# Patient Record
Sex: Female | Born: 1980 | Race: Black or African American | Hispanic: No | Marital: Single | Smoking: Never smoker
Health system: Southern US, Community
[De-identification: ages and names within clinical notes are randomized; demographics above are authoritative.]

## PROBLEM LIST (undated history)

## (undated) DIAGNOSIS — E559 Vitamin D deficiency, unspecified: Secondary | ICD-10-CM

## (undated) DIAGNOSIS — D573 Sickle-cell trait: Secondary | ICD-10-CM

## (undated) DIAGNOSIS — N879 Dysplasia of cervix uteri, unspecified: Secondary | ICD-10-CM

## (undated) DIAGNOSIS — R7303 Prediabetes: Secondary | ICD-10-CM

## (undated) DIAGNOSIS — D649 Anemia, unspecified: Secondary | ICD-10-CM

## (undated) DIAGNOSIS — B009 Herpesviral infection, unspecified: Secondary | ICD-10-CM

## (undated) DIAGNOSIS — N760 Acute vaginitis: Secondary | ICD-10-CM

## (undated) DIAGNOSIS — I10 Essential (primary) hypertension: Secondary | ICD-10-CM

## (undated) DIAGNOSIS — Z9289 Personal history of other medical treatment: Secondary | ICD-10-CM

## (undated) DIAGNOSIS — B9689 Other specified bacterial agents as the cause of diseases classified elsewhere: Secondary | ICD-10-CM

## (undated) DIAGNOSIS — B977 Papillomavirus as the cause of diseases classified elsewhere: Secondary | ICD-10-CM

## (undated) HISTORY — DX: Sickle-cell trait: D57.3

## (undated) HISTORY — DX: Dysplasia of cervix uteri, unspecified: N87.9

## (undated) HISTORY — DX: Papillomavirus as the cause of diseases classified elsewhere: B97.7

## (undated) HISTORY — DX: Acute vaginitis: N76.0

## (undated) HISTORY — DX: Herpesviral infection, unspecified: B00.9

## (undated) HISTORY — DX: Other specified bacterial agents as the cause of diseases classified elsewhere: B96.89

## (undated) HISTORY — DX: Anemia, unspecified: D64.9

## (undated) HISTORY — DX: Prediabetes: R73.03

## (undated) HISTORY — PX: NO PAST SURGERIES: SHX2092

## (undated) HISTORY — DX: Personal history of other medical treatment: Z92.89

## (undated) HISTORY — DX: Vitamin D deficiency, unspecified: E55.9

---

## 2000-07-14 DIAGNOSIS — N879 Dysplasia of cervix uteri, unspecified: Secondary | ICD-10-CM

## 2000-07-14 HISTORY — DX: Dysplasia of cervix uteri, unspecified: N87.9

## 2015-01-09 DIAGNOSIS — Z9289 Personal history of other medical treatment: Secondary | ICD-10-CM

## 2015-01-09 HISTORY — DX: Personal history of other medical treatment: Z92.89

## 2015-01-09 LAB — HM PAP SMEAR

## 2016-01-12 DIAGNOSIS — E559 Vitamin D deficiency, unspecified: Secondary | ICD-10-CM

## 2016-01-12 HISTORY — DX: Vitamin D deficiency, unspecified: E55.9

## 2016-02-12 DIAGNOSIS — R7303 Prediabetes: Secondary | ICD-10-CM

## 2016-02-12 HISTORY — DX: Prediabetes: R73.03

## 2017-01-28 ENCOUNTER — Telehealth: Payer: Self-pay

## 2017-01-28 MED ORDER — NORETHINDRONE 0.35 MG PO TABS: 1.0000 | ORAL_TABLET | Freq: Every day | ORAL | 0 refills | Status: DC

## 2017-01-28 NOTE — Telephone Encounter (Signed)
Pt took her last bcp last night.  Has appt next week.  Need refill called in to [facility] to get her to appt.  Pt aware one pack eRx'd

## 2017-02-05 ENCOUNTER — Ambulatory Visit (INDEPENDENT_AMBULATORY_CARE_PROVIDER_SITE_OTHER): Payer: BLUE CROSS/BLUE SHIELD | Admitting: Advanced Practice Midwife

## 2017-02-05 ENCOUNTER — Encounter: Payer: Self-pay | Admitting: Advanced Practice Midwife

## 2017-02-05 VITALS — BP 126/88 | Ht 69.0 in | Wt 232.0 lb

## 2017-02-05 DIAGNOSIS — Z01419 Encounter for gynecological examination (general) (routine) without abnormal findings: Secondary | ICD-10-CM

## 2017-02-05 DIAGNOSIS — Z124 Encounter for screening for malignant neoplasm of cervix: Secondary | ICD-10-CM | POA: Diagnosis not present

## 2017-02-05 DIAGNOSIS — Z3041 Encounter for surveillance of contraceptive pills: Secondary | ICD-10-CM | POA: Diagnosis not present

## 2017-02-05 MED ORDER — NORETHINDRONE 0.35 MG PO TABS: 1.0000 | ORAL_TABLET | Freq: Every day | ORAL | 11 refills | Status: DC

## 2017-02-05 NOTE — Progress Notes (Signed)
Patient ID: Amber Price, female   DOB: Mar 25, 1981, 36 y.o.   MRN: [id number]     Gynecology Annual Exam  PCP: Patient, No Pcp Per  Chief Complaint:  Chief Complaint  Patient presents with  . Annual Exam    History of Present Illness: Patient is a 36 y.o. [id number] presents for annual exam. The patient has no complaints today.   LMP: Patient's last menstrual period was 01/22/2017. Average Interval: regular, 28 days Duration of flow: 5 days Heavy Menses: no Clots: no Intermenstrual Bleeding: no Postcoital Bleeding: no Dysmenorrhea: no  The patient is sexually active. She currently uses oral progesterone-only contraceptive for contraception. She denies dyspareunia.  The patient does occasionally perform self breast exams.  There is no notable family history of breast or ovarian cancer in her family.  The patient wears seatbelts: yes.   The patient has regular exercise: yes.  She admits healthy lifestyle diet, hydration and exercise.  The patient denies current symptoms of depression.    Review of Systems: Review of Systems  Constitutional: Negative.   HENT: Negative.   Eyes: Negative.   Respiratory: Negative.   Cardiovascular: Negative.   Gastrointestinal: Negative.   Genitourinary: Negative.   Musculoskeletal: Negative.   Skin: Negative.   Neurological: Negative.   Endo/Heme/Allergies: Negative.   Psychiatric/Behavioral: Negative.     Past Medical History:  Past Medical History:  Diagnosis Date  . Herpes     Past Surgical History:  History reviewed. No pertinent surgical history.  Gynecologic History:  Patient's last menstrual period was 01/22/2017. Contraception: oral progesterone-only contraceptive Last Pap: Results were: no abnormalities   Obstetric History: [id number]  Family History:  Family History  Problem Relation Age of Onset  . Diabetes Mellitus II Mother   . Hypercholesterolemia Father     Social History:  Social History   Social History   . Marital status: Single    Spouse name: N/A  . Number of children: N/A  . Years of education: N/A   Occupational History  . Not on file.   Social History Main Topics  . Smoking status: Never Smoker  . Smokeless tobacco: Never Used  . Alcohol use No  . Drug use: No  . Sexual activity: Yes    Birth control/ protection: Pill   Other Topics Concern  . Not on file   Social History Narrative  . No narrative on file    Allergies:  No Known Allergies  Medications: Prior to Admission medications   Medication Sig Start Date End Date Taking? Authorizing Provider  norethindrone (CAMILA) 0.35 MG tablet Take 1 tablet (0.35 mg total) by mouth daily. 01/28/17  Yes Rod Can, CNM  valACYclovir (VALTREX) 500 MG tablet Take 500 mg by mouth 2 (two) times daily.   Yes [provider]    Physical Exam Vitals: Blood pressure 126/88, height 5\' 9"  (1.753 m), weight 232 lb (105.2 kg), last menstrual period 01/22/2017.  General: NAD HEENT: normocephalic, anicteric Thyroid: no enlargement, no palpable nodules Pulmonary: No increased work of breathing, CTAB Cardiovascular: RRR, distal pulses 2+ Breast: Breast symmetrical, no tenderness, no palpable nodules or masses, no skin or nipple retraction present, no nipple discharge.  No axillary or supraclavicular lymphadenopathy. Abdomen: NABS, soft, non-tender, non-distended.  Umbilicus without lesions.  No hepatomegaly, splenomegaly or masses palpable. No evidence of hernia  Genitourinary:  External: Normal external female genitalia.  Normal urethral meatus, normal  Bartholin's and Skene's glands.    Vagina: Normal vaginal mucosa, no evidence of  prolapse.    Cervix: Grossly normal in appearance, no bleeding  Uterus: Non-enlarged, mobile, normal contour.  No CMT  Adnexa: ovaries non-enlarged, no adnexal masses  Rectal: deferred  Lymphatic: no evidence of inguinal lymphadenopathy Extremities: no edema, erythema, or  tenderness Neurologic: Grossly intact Psychiatric: mood appropriate, affect full   Assessment: 36 y.o. [id number] Well woman exam with PAP smear  Plan: Problem List Items Addressed This Visit    None    Visit Diagnoses    Well woman exam with routine gynecological exam    -  Primary   Relevant Orders   IGP, Aptima HPV   Cervical cancer screening       Relevant Orders   IGP, Aptima HPV      1) STI screening was offered and declined  2) ASCCP guidelines and rational discussed.  Patient opts for every 2 years screening interval  3) Contraception - Patient chooses to continue on current progesterone-only pill  4) Routine healthcare maintenance including cholesterol, diabetes screening discussed managed by PCP  5) Follow up 1 year for routine annual exam  Rod Can, CNM

## 2017-02-07 LAB — IGP, APTIMA HPV
HPV APTIMA: NEGATIVE
PAP SMEAR COMMENT: 0

## 2017-02-12 ENCOUNTER — Encounter: Payer: Self-pay | Admitting: Advanced Practice Midwife

## 2017-02-12 ENCOUNTER — Other Ambulatory Visit: Payer: Self-pay

## 2017-02-12 MED ORDER — VALACYCLOVIR HCL 500 MG PO TABS: 500.0000 mg | ORAL_TABLET | Freq: Two times a day (BID) | ORAL | 0 refills | Status: DC

## 2017-04-19 ENCOUNTER — Other Ambulatory Visit: Payer: Self-pay | Admitting: Advanced Practice Midwife

## 2017-04-20 ENCOUNTER — Other Ambulatory Visit: Payer: Self-pay | Admitting: Advanced Practice Midwife

## 2017-07-01 ENCOUNTER — Encounter: Payer: BLUE CROSS/BLUE SHIELD | Admitting: Obstetrics and Gynecology

## 2017-07-03 ENCOUNTER — Ambulatory Visit (INDEPENDENT_AMBULATORY_CARE_PROVIDER_SITE_OTHER): Payer: BLUE CROSS/BLUE SHIELD | Admitting: Maternal Newborn

## 2017-07-03 ENCOUNTER — Encounter: Payer: Self-pay | Admitting: Maternal Newborn

## 2017-07-03 VITALS — BP 136/90 | Wt 240.0 lb

## 2017-07-03 DIAGNOSIS — Z8619 Personal history of other infectious and parasitic diseases: Secondary | ICD-10-CM | POA: Insufficient documentation

## 2017-07-03 DIAGNOSIS — E669 Obesity, unspecified: Secondary | ICD-10-CM | POA: Insufficient documentation

## 2017-07-03 DIAGNOSIS — Z6835 Body mass index (BMI) 35.0-35.9, adult: Secondary | ICD-10-CM | POA: Insufficient documentation

## 2017-07-03 DIAGNOSIS — Z3480 Encounter for supervision of other normal pregnancy, unspecified trimester: Secondary | ICD-10-CM | POA: Insufficient documentation

## 2017-07-03 NOTE — Progress Notes (Signed)
No concerns.rj 

## 2017-07-03 NOTE — Progress Notes (Signed)
07/03/2017   Chief Complaint: Amenorrhea, positive home pregnancy test, desires prenatal care.  Transfer of Care Patient: no  History of Present Illness: Amber Price is a 36 y.o. G2P2002 at 10w 0d based on Patient's last menstrual period on 04/24/2017 (approximate), with an Estimated Date of Delivery: 01/29/2018, with the above CC.   Her periods were: regular periods every 28 days She was using no method when she conceived.  She has Positive signs or symptoms of nausea/vomiting of pregnancy. She has Negative signs or symptoms of miscarriage or preterm labor She identifies Negative Zika risk factors for her and her partner On any different medications around the time she conceived/early pregnancy: No  History of varicella: Yes   ROS: A 12-point review of systems was performed and negative, except as stated in the above HPI.  OBGYN History: As per HPI. OB History  Gravida Para Term Preterm AB Living  3 2 2     2   SAB TAB Ectopic Multiple Live Births          2    # Outcome Date GA Lbr Len/2nd Weight Sex Delivery Anes PTL Lv  3 Current           2 Term      Vag-Spont     1 Term      Vag-Spont         Any issues with any prior pregnancies: no Any prior children are healthy, doing well, without any problems or issues: yes History of pap smears: Yes. Last pap smear 02/05/2017. NILM and HPV Negative History of STIs: Yes, herpes   Past Medical History: Past Medical History:  Diagnosis Date  . Bacterial vaginosis   . Cervical dysplasia 2002   [place]  . Herpes   . History of Papanicolaou smear of cervix 01/09/2015   -/-  . 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 Pre-diabetes 02/2016   DIET, EXERCISE, WT LOSS. RECK 2018 ANNUAL  . Sickle cell trait ([place])   . Vitamin D deficiency 01/2016   ADD VIT D3 5000 IU DAILY    Past Surgical History: Past Surgical History:  Procedure Laterality Date  . NO PAST SURGERIES      Family History:  Family History  Problem Relation Age of  Onset  . Diabetes Mellitus II Mother   . Hypertension Mother   . Hypercholesterolemia Father   . Cancer Paternal Grandmother 2       LUNG   She denies any female cancers, bleeding or blood clotting disorders.  She denies any history of intellectual disability, birth defects or genetic disorders in her or the FOB's history  Social History:  Social History   Socioeconomic History  . Marital status: Single    Spouse name: Not on file  . Number of children: 2  . Years of education: 34  . Highest education level: Not on file  Social Needs  . Financial resource strain: Not on file  . Food insecurity - worry: Not on file  . Food insecurity - inability: Not on file  . Transportation needs - medical: Not on file  . Transportation needs - non-medical: Not on file  Occupational History  . Occupation: BUSINESS OFFICE SPECIALIST  Tobacco Use  . Smoking status: Never Smoker  . Smokeless tobacco: Never Used  Substance and Sexual Activity  . Alcohol use: Yes  . Drug use: No  . Sexual activity: Yes    Birth control/protection: Pill  Other Topics  Concern  . Not on file  Social History Narrative  . Not on file   Any cats in the household: no  Allergy: No Known Allergies  Current Outpatient Medications:  Current Outpatient Medications:  .  Cholecalciferol (VITAMIN D3) 50000 units CAPS, Take 1 capsule by mouth daily., Disp: , Rfl: 2 .  valACYclovir (VALTREX) 500 MG tablet, TAKE 1 TABLET BY MOUTH TWICE A DAY, Disp: 30 tablet, Rfl: 0   Physical Exam:   BP 136/90   Wt 240 lb (108.9 kg)   LMP 04/24/2017 (Approximate)   BMI 35.44 kg/m  Body mass index is 35.44 kg/m. Constitutional: Well nourished, well developed female in no acute distress.  Neck:  Supple, normal appearance, and no thyromegaly  Cardiovascular: regular rate and rhythm Respiratory:  Clear to auscultation bilateral. Normal respiratory effort Abdomen: positive bowel sounds and no masses, hernias; diffusely non  tender to palpation, non distended Breasts: breasts appear normal, no suspicious masses, no skin or nipple changes or axillary nodes, unchanged from previous exams. Neuro/Psych:  Normal mood and affect.  Skin:  Warm and dry.  Lymphatic:  No inguinal lymphadenopathy.   Pelvic exam: is not limited by body habitus External genitalia, Bartholin's glands, Urethra, Skene's glands: within normal limits Vagina: within normal limits and with no blood in the vault  Cervix: normal appearing cervix without discharge or lesions, closed/long/high Uterus:  enlarged, c/w 10 week size Adnexa:  positive for: tenderness on the left side, otherwise normal exam  Assessment: Amber Price is a 36 y.o. P1W2585 Unknown based on Patient's last menstrual period was 04/24/2017 (approximate). with an Estimated Date of Delivery: None noted., presenting for prenatal care.  Plan:  1) Avoid alcoholic beverages. 2) Patient encouraged not to smoke.  3) Discontinue the use of all non-medicinal drugs and chemicals.  4) Take prenatal vitamins daily.  5) Seatbelt use advised 6) Nutrition, food safety (fish, cheese advisories, and high nitrite foods) and exercise discussed. 7) Hospital and practice style delivering at [facility] discussed  8) Patient is asked about travel to areas at risk for the [place] virus, and counseled to avoid travel and exposure to mosquitoes or sexual partners who may have themselves been exposed to the virus. Testing is discussed, and will be ordered as appropriate.  9) Childbirth classes at [facility] advised 10) Genetic Screening, such as with 1st Trimester Screening, cell free fetal DNA, AFP testing, and Ultrasound, as well as with amniocentesis and [facility] as appropriate, is discussed with patient. She plans to have genetic testing this pregnancy.  Problem list reviewed and updated.  Avel Sensor, CNM [facility], [facility] 07/03/2017  4:57 PM

## 2017-07-03 NOTE — Patient Instructions (Signed)
First Trimester of Pregnancy The first trimester of pregnancy is from week 1 until the end of week 13 (months 1 through 3). A week after a sperm fertilizes an egg, the egg will implant on the wall of the uterus. This embryo will begin to develop into a baby. Genes from you and your partner will form the baby. The female genes will determine whether the baby will be a boy or a girl. At 6-8 weeks, the eyes and face will be formed, and the heartbeat can be seen on ultrasound. At the end of 12 weeks, all the baby's organs will be formed. Now that you are pregnant, you will want to do everything you can to have a healthy baby. Two of the most important things are to get good prenatal care and to follow your health care provider's instructions. Prenatal care is all the medical care you receive before the baby's birth. This care will help prevent, find, and treat any problems during the pregnancy and childbirth. Body changes during your first trimester Your body goes through many changes during pregnancy. The changes vary from woman to woman.  You may gain or lose a couple of pounds at first.  You may feel sick to your stomach (nauseous) and you may throw up (vomit). If the vomiting is uncontrollable, call your health care provider.  You may tire easily.  You may develop headaches that can be relieved by medicines. All medicines should be approved by your health care provider.  You may urinate more often. Painful urination may mean you have a bladder infection.  You may develop heartburn as a result of your pregnancy.  You may develop constipation because certain hormones are causing the muscles that push stool through your intestines to slow down.  You may develop hemorrhoids or swollen veins (varicose veins).  Your breasts may begin to grow larger and become tender. Your nipples may stick out more, and the tissue that surrounds them (areola) may become darker.  Your gums may bleed and may be  sensitive to brushing and flossing.  Dark spots or blotches (chloasma, mask of pregnancy) may develop on your face. This will likely fade after the baby is born.  Your menstrual periods will stop.  You may have a loss of appetite.  You may develop cravings for certain kinds of food.  You may have changes in your emotions from day to day, such as being excited to be pregnant or being concerned that something may go wrong with the pregnancy and baby.  You may have more vivid and strange dreams.  You may have changes in your hair. These can include thickening of your hair, rapid growth, and changes in texture. Some women also have hair loss during or after pregnancy, or hair that feels dry or thin. Your hair will most likely return to normal after your baby is born.  What to expect at prenatal visits During a routine prenatal visit:  You will be weighed to make sure you and the baby are growing normally.  Your blood pressure will be taken.  Your abdomen will be measured to track your baby's growth.  The fetal heartbeat will be listened to between weeks 10 and 14 of your pregnancy.  Test results from any previous visits will be discussed.  Your health care provider may ask you:  How you are feeling.  If you are feeling the baby move.  If you have had any abnormal symptoms, such as leaking fluid, bleeding, severe headaches,   or abdominal cramping.  If you are using any tobacco products, including cigarettes, chewing tobacco, and electronic cigarettes.  If you have any questions.  Other tests that may be performed during your first trimester include:  Blood tests to find your blood type and to check for the presence of any previous infections. The tests will also be used to check for low iron levels (anemia) and protein on red blood cells (Rh antibodies). Depending on your risk factors, or if you previously had diabetes during pregnancy, you may have tests to check for high blood  sugar that affects pregnant women (gestational diabetes).  Urine tests to check for infections, diabetes, or protein in the urine.  An ultrasound to confirm the proper growth and development of the baby.  Fetal screens for spinal cord problems (spina bifida) and Down syndrome.  HIV (human immunodeficiency virus) testing. Routine prenatal testing includes screening for HIV, unless you choose not to have this test.  You may need other tests to make sure you and the baby are doing well.  Follow these instructions at home: Medicines  Follow your health care provider's instructions regarding medicine use. Specific medicines may be either safe or unsafe to take during pregnancy.  Take a prenatal vitamin that contains at least 600 micrograms (mcg) of folic acid.  If you develop constipation, try taking a stool softener if your health care provider approves. Eating and drinking  Eat a balanced diet that includes fresh fruits and vegetables, whole grains, good sources of protein such as meat, eggs, or tofu, and low-fat dairy. Your health care provider will help you determine the amount of weight gain that is right for you.  Avoid raw meat and uncooked cheese. These carry germs that can cause birth defects in the baby.  Eating four or five small meals rather than three large meals a day may help relieve nausea and vomiting. If you start to feel nauseous, eating a few soda crackers can be helpful. Drinking liquids between meals, instead of during meals, also seems to help ease nausea and vomiting.  Limit foods that are high in fat and processed sugars, such as fried and sweet foods.  To prevent constipation: ? Eat foods that are high in fiber, such as fresh fruits and vegetables, whole grains, and beans. ? Drink enough fluid to keep your urine clear or pale yellow. Activity  Exercise only as directed by your health care provider. Most women can continue their usual exercise routine during  pregnancy. Try to exercise for 30 minutes at least 5 days a week. Exercising will help you: ? Control your weight. ? Stay in shape. ? Be prepared for labor and delivery.  Experiencing pain or cramping in the lower abdomen or lower back is a good sign that you should stop exercising. Check with your health care provider before continuing with normal exercises.  Try to avoid standing for long periods of time. Move your legs often if you must stand in one place for a long time.  Avoid heavy lifting.  Wear low-heeled shoes and practice good posture.  You may continue to have sex unless your health care provider tells you not to. Relieving pain and discomfort  Wear a good support bra to relieve breast tenderness.  Take warm sitz baths to soothe any pain or discomfort caused by hemorrhoids. Use hemorrhoid cream if your health care provider approves.  Rest with your legs elevated if you have leg cramps or low back pain.  If you develop   varicose veins in your legs, wear support hose. Elevate your feet for 15 minutes, 3-4 times a day. Limit salt in your diet. Prenatal care  Schedule your prenatal visits by the twelfth week of pregnancy. They are usually scheduled monthly at first, then more often in the last 2 months before delivery.  Write down your questions. Take them to your prenatal visits.  Keep all your prenatal visits as told by your health care provider. This is important. Safety  Wear your seat belt at all times when driving.  Make a list of emergency phone numbers, including numbers for family, friends, the hospital, and police and fire departments. General instructions  Ask your health care provider for a referral to a local prenatal education class. Begin classes no later than the beginning of month 6 of your pregnancy.  Ask for help if you have counseling or nutritional needs during pregnancy. Your health care provider can offer advice or refer you to specialists for help  with various needs.  Do not use hot tubs, steam rooms, or saunas.  Do not douche or use tampons or scented sanitary pads.  Do not cross your legs for long periods of time.  Avoid cat litter boxes and soil used by cats. These carry germs that can cause birth defects in the baby and possibly loss of the fetus by miscarriage or stillbirth.  Avoid all smoking, herbs, alcohol, and medicines not prescribed by your health care provider. Chemicals in these products affect the formation and growth of the baby.  Do not use any products that contain nicotine or tobacco, such as cigarettes and e-cigarettes. If you need help quitting, ask your health care provider. You may receive counseling support and other resources to help you quit.  Schedule a dentist appointment. At home, brush your teeth with a soft toothbrush and be gentle when you floss. Contact a health care provider if:  You have dizziness.  You have mild pelvic cramps, pelvic pressure, or nagging pain in the abdominal area.  You have persistent nausea, vomiting, or diarrhea.  You have a bad smelling vaginal discharge.  You have pain when you urinate.  You notice increased swelling in your face, hands, legs, or ankles.  You are exposed to fifth disease or chickenpox.  You are exposed to German measles (rubella) and have never had it. Get help right away if:  You have a fever.  You are leaking fluid from your vagina.  You have spotting or bleeding from your vagina.  You have severe abdominal cramping or pain.  You have rapid weight gain or loss.  You vomit blood or material that looks like coffee grounds.  You develop a severe headache.  You have shortness of breath.  You have any kind of trauma, such as from a fall or a car accident. Summary  The first trimester of pregnancy is from week 1 until the end of week 13 (months 1 through 3).  Your body goes through many changes during pregnancy. The changes vary from  woman to woman.  You will have routine prenatal visits. During those visits, your health care provider will examine you, discuss any test results you may have, and talk with you about how you are feeling. This information is not intended to replace advice given to you by your health care provider. Make sure you discuss any questions you have with your health care provider. Document Released: 06/24/2001 Document Revised: 06/11/2016 Document Reviewed: 06/11/2016 Elsevier Interactive Patient Education  2018 Elsevier   Inc.  

## 2017-07-04 LAB — RPR+RH+ABO+RUB AB+AB SCR+CB...
ANTIBODY SCREEN: NEGATIVE
HEMOGLOBIN: 11 g/dL — AB (ref 11.1–15.9)
HIV SCREEN 4TH GENERATION: NONREACTIVE
Hematocrit: 34.2 % (ref 34.0–46.6)
Hepatitis B Surface Ag: NEGATIVE
MCH: 25.8 pg — AB (ref 26.6–33.0)
MCHC: 32.2 g/dL (ref 31.5–35.7)
MCV: 80 fL (ref 79–97)
PLATELETS: 280 10*3/uL (ref 150–379)
RBC: 4.27 x10E6/uL (ref 3.77–5.28)
RDW: 15 % (ref 12.3–15.4)
RPR: NONREACTIVE
Rh Factor: POSITIVE
VARICELLA: 761 {index} (ref >165)
WBC: 8.6 10*3/uL (ref 3.4–10.8)

## 2017-07-06 LAB — URINE DRUG PANEL 7
Amphetamines, Urine: NEGATIVE ng/mL
Barbiturate Quant, Ur: NEGATIVE ng/mL
Benzodiazepine Quant, Ur: NEGATIVE ng/mL
CANNABINOID QUANT UR: NEGATIVE ng/mL
Cocaine (Metab.): NEGATIVE ng/mL
Opiate Quant, Ur: NEGATIVE ng/mL
PCP QUANT UR: NEGATIVE ng/mL

## 2017-07-06 LAB — URINE CULTURE

## 2017-07-06 LAB — GC/CHLAMYDIA PROBE AMP
CHLAMYDIA, DNA PROBE: NEGATIVE
NEISSERIA GONORRHOEAE BY PCR: NEGATIVE

## 2017-07-08 ENCOUNTER — Other Ambulatory Visit: Payer: Self-pay | Admitting: Maternal Newborn

## 2017-07-08 DIAGNOSIS — Z0189 Encounter for other specified special examinations: Secondary | ICD-10-CM

## 2017-07-09 ENCOUNTER — Encounter: Payer: Self-pay | Admitting: Obstetrics and Gynecology

## 2017-07-09 ENCOUNTER — Ambulatory Visit (INDEPENDENT_AMBULATORY_CARE_PROVIDER_SITE_OTHER): Payer: BLUE CROSS/BLUE SHIELD

## 2017-07-09 ENCOUNTER — Ambulatory Visit (INDEPENDENT_AMBULATORY_CARE_PROVIDER_SITE_OTHER): Payer: BLUE CROSS/BLUE SHIELD | Admitting: Obstetrics and Gynecology

## 2017-07-09 ENCOUNTER — Other Ambulatory Visit: Payer: Self-pay | Admitting: Maternal Newborn

## 2017-07-09 VITALS — BP 146/96 | Wt 240.0 lb

## 2017-07-09 DIAGNOSIS — Z0189 Encounter for other specified special examinations: Secondary | ICD-10-CM

## 2017-07-09 DIAGNOSIS — Z362 Encounter for other antenatal screening follow-up: Secondary | ICD-10-CM | POA: Diagnosis not present

## 2017-07-09 DIAGNOSIS — O021 Missed abortion: Secondary | ICD-10-CM

## 2017-07-09 MED ORDER — HYDROCODONE-ACETAMINOPHEN 5-325 MG PO TABS: 1.0000 | ORAL_TABLET | ORAL | 0 refills | Status: DC | PRN

## 2017-07-09 MED ORDER — MISOPROSTOL 200 MCG PO TABS: ORAL_TABLET | ORAL | 0 refills | Status: DC

## 2017-07-09 NOTE — Progress Notes (Signed)
  Routine Prenatal Care Visit  Subjective  Amber Price is a 36 y.o. G3P2002 at [redacted]w[redacted]d being seen today for ongoing prenatal care.  Missed abortion of twin gestation found today on ultrasound. Patient tearful with significant other in room.   Objective  Blood pressure (!) 146/96, weight 240 lb (108.9 kg), last menstrual period 04/24/2017. Pregravid weight 243 lb (110.2 kg) Total Weight Gain  (-1.361 kg) Urinalysis:      Fetal Status:           General:  Alert, oriented and cooperative. Patient is in no acute distress.  Skin: Skin is warm and dry. No rash noted.   Cardiovascular: Normal heart rate noted  Respiratory: Normal respiratory effort, no problems with respiration noted  Abdomen: Soft, gravid, appropriate for gestational age.       Pelvic:  Cervical exam deferred        Extremities: Normal range of motion.     Mental Status: Normal mood and affect. Normal behavior. Normal judgment and thought content.   Assessment   36 y.o. G3P2002 at [redacted]w[redacted]d by  01/29/2018, by Last Menstrual Period presenting for routine prenatal visit  Plan   THIRD Problems (from 07/03/17 to present)    Problem Noted Resolved   Supervision of other normal pregnancy, antepartum 07/03/2017 by Rexene Agent, CNM No   Overview Signed 07/03/2017  5:03 PM by Rexene Agent, [place] Prenatal Labs  Dating  Blood type:     Genetic Screen 1 Screen:    AFP:     Quad:     NIPS: Antibody:   Anatomic [place]  Rubella:   Varicella:    GTT Early:               Third trimester:  RPR:     Rhogam  HBsAg:     TDaP vaccine                       Flu Shot: HIV:     Baby Food                                GBS:   Contraception  Pap: 02/05/2017, NILM and HPV Neg  CBB     CS/VBAC    Support Person              History of herpes genitalis 07/03/2017 by Rexene Agent, CNM No   BMI 35.0-35.9,adult 07/03/2017 by Rexene Agent, CNM No       Reviewed option of expectant, medical and surgical  management. Patient opted for medical management of miscarriage. Given information from [employer], bleeding precautions, pain medications and a work note. Will have patient follow up in 1 week for transvaginal US and follow up visit.   Homero Fellers, MD  07/09/2017 5:47 PM

## 2017-07-09 NOTE — Patient Instructions (Signed)

## 2017-07-15 ENCOUNTER — Ambulatory Visit: Payer: BLUE CROSS/BLUE SHIELD | Admitting: Obstetrics and Gynecology

## 2017-07-15 ENCOUNTER — Other Ambulatory Visit: Payer: BLUE CROSS/BLUE SHIELD

## 2017-07-15 ENCOUNTER — Other Ambulatory Visit: Payer: Self-pay | Admitting: Obstetrics and Gynecology

## 2017-07-15 ENCOUNTER — Telehealth: Payer: Self-pay | Admitting: Obstetrics and Gynecology

## 2017-07-15 NOTE — Telephone Encounter (Signed)
Note is up front for pt if needed

## 2017-07-21 ENCOUNTER — Ambulatory Visit (INDEPENDENT_AMBULATORY_CARE_PROVIDER_SITE_OTHER): Payer: BLUE CROSS/BLUE SHIELD | Admitting: Obstetrics and Gynecology

## 2017-07-21 ENCOUNTER — Ambulatory Visit (INDEPENDENT_AMBULATORY_CARE_PROVIDER_SITE_OTHER): Payer: BLUE CROSS/BLUE SHIELD

## 2017-07-21 ENCOUNTER — Encounter: Payer: Self-pay | Admitting: Obstetrics and Gynecology

## 2017-07-21 VITALS — BP 122/70 | HR 84 | Ht 69.0 in | Wt 238.0 lb

## 2017-07-21 DIAGNOSIS — O039 Complete or unspecified spontaneous abortion without complication: Secondary | ICD-10-CM

## 2017-07-21 DIAGNOSIS — O021 Missed abortion: Secondary | ICD-10-CM

## 2017-07-21 NOTE — Progress Notes (Signed)
Patient ID: Amber Price, female   DOB: 1981-06-07, 37 y.o.   MRN: [id number]  Reason for Consult: U/S follow up (First trimester miscarriage) and Medication Refill (Valtrex)   Referred by Homero Fellers, *  Subjective:     HPI:  Amber Price is a 37 y.o. female she presents today for follow up from a missed abortion. She was diagnosed last week with a missed abortion of a twin gestation. She was counseled on her options and elected for medical management. She took cytotec on Monday 07/13/17 and passed tissue her bleeding has gradually subsided and today she is only having small spotting. She is feeling well without abdominal pain or weakness. [place] today in office did not show retained products. Small 1cm fibroid. Reviewed images with patient.    Past Medical History:  Diagnosis Date  . Bacterial vaginosis   . Cervical dysplasia 2002   [place]  . Herpes   . History of Papanicolaou smear of cervix 01/09/2015   -/-  . 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 Pre-diabetes 02/2016   DIET, EXERCISE, WT LOSS. RECK 2018 ANNUAL  . Sickle cell trait ([place])   . Vitamin D deficiency 01/2016   ADD VIT D3 5000 IU DAILY   Family History  Problem Relation Age of Onset  . Diabetes Mellitus II Mother   . Hypertension Mother   . Hypercholesterolemia Father   . Cancer Paternal Grandmother 67       LUNG   Past Surgical History:  Procedure Laterality Date  . NO PAST SURGERIES      Short Social History:  Social History   Tobacco Use  . Smoking status: Never Smoker  . Smokeless tobacco: Never Used  Substance Use Topics  . Alcohol use: Yes    No Known Allergies  Current Outpatient Medications  Medication Sig Dispense Refill  . valACYclovir (VALTREX) 500 MG tablet TAKE 1 TABLET BY MOUTH TWICE A DAY 30 tablet 0   No current facility-administered medications for this visit.     Review of Systems  Constitutional: Negative for chills, fatigue, fever and unexpected weight change.    HENT: Negative for trouble swallowing.  Eyes: Negative for loss of vision.  Respiratory: Negative for cough, shortness of breath and wheezing.  Cardiovascular: Negative for chest pain, leg swelling, palpitations and syncope.  GI: Negative for abdominal pain, blood in stool, diarrhea, nausea and vomiting.  GU: Negative for difficulty urinating, dysuria, frequency and hematuria.  Musculoskeletal: Negative for back pain, leg pain and joint pain.  Skin: Negative for rash.  Neurological: Negative for dizziness, headaches, light-headedness, numbness and seizures.  Psychiatric: Negative for behavioral problem, confusion, depressed mood and sleep disturbance.        Objective:  Objective   Vitals:   07/21/17 1126  BP: 122/70  Pulse: 84  Weight: 238 lb (108 kg)  Height: 5\' 9"  (1.753 m)   Body mass index is 35.15 kg/m.  Physical Exam  Constitutional: She is oriented to person, place, and time. She appears well-developed and well-nourished.  HENT:  Head: Normocephalic and atraumatic.  Cardiovascular: Normal rate and regular rhythm.  Pulmonary/Chest: Effort normal and breath sounds normal.  Abdominal: Soft.  Neurological: She is alert and oriented to person, place, and time.  Skin: Skin is warm and dry.  Psychiatric: She has a normal mood and affect. Her behavior is normal. Judgment and thought content normal.   Transvaginal US images reviewed. 32mm endometrium, no retained  products of conception.      Assessment/Plan:   42AS [id number] s/p a missed abortion, now complete after medical management. Follow up as needed.   More than 10 minutes spent in the room face to face with patient.  Homero Fellers MD [facility] 07/21/17 1:33 PM

## 2017-09-04 ENCOUNTER — Other Ambulatory Visit: Payer: Self-pay | Admitting: Advanced Practice Midwife

## 2017-11-20 ENCOUNTER — Encounter: Payer: Self-pay | Admitting: Obstetrics and Gynecology

## 2017-11-20 ENCOUNTER — Ambulatory Visit (INDEPENDENT_AMBULATORY_CARE_PROVIDER_SITE_OTHER): Payer: BLUE CROSS/BLUE SHIELD | Admitting: Obstetrics and Gynecology

## 2017-11-20 ENCOUNTER — Encounter: Payer: BLUE CROSS/BLUE SHIELD | Admitting: Obstetrics and Gynecology

## 2017-11-20 VITALS — BP 130/80 | Wt 240.0 lb

## 2017-11-20 DIAGNOSIS — O09521 Supervision of elderly multigravida, first trimester: Secondary | ICD-10-CM

## 2017-11-20 DIAGNOSIS — Z3201 Encounter for pregnancy test, result positive: Secondary | ICD-10-CM | POA: Diagnosis not present

## 2017-11-20 DIAGNOSIS — O0991 Supervision of high risk pregnancy, unspecified, first trimester: Secondary | ICD-10-CM

## 2017-11-20 DIAGNOSIS — Z8619 Personal history of other infectious and parasitic diseases: Secondary | ICD-10-CM

## 2017-11-20 LAB — POCT URINE PREGNANCY: PREG TEST UR: POSITIVE — AB

## 2017-11-20 NOTE — Progress Notes (Signed)
12/13/2017   Chief Complaint: Missed period  Transfer of Care Patient: no  History of Present Illness: Amber Price is a 37 y.o. [id number] [redacted]w[redacted]d based on Patient's last menstrual period was 09/13/2017 (exact date). with an Estimated Date of Delivery: 06/20/18, with the above CC.   Her periods were: regular periods every 30 days She was using no method when she conceived.  She has Positive signs or symptoms of nausea/vomiting of pregnancy. She has negative signs or symptoms of miscarriage or preterm labor She identifies Negative Zika risk factors for her and her partner On any different medications around the time she conceived/early pregnancy: No  History of varicella: Yes   ROS: A 12-point review of systems was performed and negative, except as stated in the above HPI.  OBGYN History: As per HPI. OB History  Gravida Para Term Preterm AB Living  4 2 2     2   SAB TAB Ectopic Multiple Live Births          2    # Outcome Date GA Lbr Len/2nd Weight Sex Delivery Anes PTL Lv  4 Current           3 Gravida           2 Term      Vag-Spont     1 Term      Vag-Spont       Any issues with any prior pregnancies: no Any prior children are healthy, doing well, without any problems or issues: yes History of pap smears: Yes. Last pap smear 02/05/2017. Abnormal: no NIL History of STIs: Yes, herpes   Past Medical History: Past Medical History:  Diagnosis Date  . Bacterial vaginosis   . Cervical dysplasia 2002   [place]  . Herpes   . History of Papanicolaou smear of cervix 01/09/2015   -/-  . 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 Pre-diabetes 02/2016   DIET, EXERCISE, WT LOSS. RECK 2018 ANNUAL  . Sickle cell trait ([place])   . Vitamin D deficiency 01/2016   ADD VIT D3 5000 IU DAILY    Past Surgical History: Past Surgical History:  Procedure Laterality Date  . NO PAST SURGERIES      Family History:  Family History  Problem Relation Age of Onset  . Diabetes Mellitus II Mother   .  Hypertension Mother   . Hypercholesterolemia Father   . Cancer Paternal Grandmother 51       LUNG   She denies any female cancers, bleeding or blood clotting disorders.  She denies any history of mental retardation, birth defects or genetic disorders in her or the FOB's history  Social History:  Social History   Socioeconomic History  . Marital status: Single    Spouse name: Not on file  . Number of children: 2  . Years of education: 21  . Highest education level: Not on file  Occupational History  . Occupation: BUSINESS OFFICE SPECIALIST  Social Needs  . Financial resource strain: Not on file  . Food insecurity:    Worry: Not on file    Inability: Not on file  . Transportation needs:    Medical: Not on file    Non-medical: Not on file  Tobacco Use  . Smoking status: Never Smoker  . Smokeless tobacco: Never Used  Substance and Sexual Activity  . Alcohol use: Yes  . Drug use: No  . Sexual activity: Yes    Birth control/protection: Pill  Lifestyle  . Physical activity:    Days per week: Not on file    Minutes per session: Not on file  . Stress: Not on file  Relationships  . Social connections:    Talks on phone: Not on file    Gets together: Not on file    Attends religious service: Not on file    Active member of club or organization: Not on file    Attends meetings of clubs or organizations: Not on file    Relationship status: Not on file  . Intimate partner violence:    Fear of current or ex partner: Not on file    Emotionally abused: Not on file    Physically abused: Not on file    Forced sexual activity: Not on file  Other Topics Concern  . Not on file  Social History Narrative  . Not on file    Allergy: No Known Allergies  Current Outpatient Medications:  Current Outpatient Medications:  .  valACYclovir (VALTREX) 500 MG tablet, Take 1 tablet (500 mg total) by mouth 2 (two) times daily., Disp: 30 tablet, Rfl: 0   Physical Exam:   BP 130/80    Wt 240 lb (108.9 kg)   LMP 09/13/2017 (Exact Date)   BMI 35.44 kg/m  Body mass index is 35.44 kg/m. Constitutional: Well nourished, well developed female in no acute distress.  Neck:  Supple, normal appearance, and no thyromegaly  Cardiovascular: S1, S2 normal, no murmur, rub or gallop, regular rate and rhythm Respiratory:  Clear to auscultation bilateral. Normal respiratory effort Abdomen: positive bowel sounds and no masses, hernias; diffusely non tender to palpation, non distended Breasts: breasts appear normal, no suspicious masses, no skin or nipple changes or axillary nodes. Neuro/Psych:  Normal mood and affect.  Skin:  Warm and dry.  Lymphatic:  No inguinal lymphadenopathy.   Pelvic exam: is not limited by body habitus EGBUS: within normal limits, Vagina: within normal limits and with no blood in the vault, Cervix: normal appearing cervix without discharge or lesions, closed/long/high, Uterus:  nonenlarged, and Adnexa:  no mass, fullness, tenderness  Bedsisde ultrasound shows an empty gestational sac. No yolk sac, no fetal pole seen.   Assessment: Amber Price is a 37 y.o. [id number] [redacted]w[redacted]d based on Patient's last menstrual period was 09/13/2017 (exact date). with an Estimated Date of Delivery: 06/20/18,  for prenatal care.  Plan:  1) Avoid alcoholic beverages. 2) Patient encouraged not to smoke.  3) Discontinue the use of all non-medicinal drugs and chemicals.  4) Take prenatal vitamins daily.  5) Seatbelt use advised 6) Nutrition, food safety (fish, cheese advisories, and high nitrite foods) and exercise discussed. 7) Hospital and practice style delivering at [facility] discussed  8) Patient is asked about travel to areas at risk for the [place] virus, and counseled to avoid travel and exposure to mosquitoes or sexual partners who may have themselves been exposed to the virus. Testing is discussed, and will be ordered as appropriate.  9) Childbirth classes at [facility] advised 10) Genetic  Screening, such as with 1st Trimester Screening, cell free fetal DNA, AFP testing, and Ultrasound, as well as with amniocentesis and CVS as appropriate, is discussed with patient. She plans to have  genetic testing this pregnancy. 11)Only small gestational sac seen on bedside US. Will obtain Beta hcg levels next week and plan for repeat ultrasound.   Adrian Prows MD [facility], [facility] 12/13/17 5:07 PM

## 2017-11-24 ENCOUNTER — Other Ambulatory Visit: Payer: BLUE CROSS/BLUE SHIELD

## 2017-11-24 DIAGNOSIS — Z3201 Encounter for pregnancy test, result positive: Secondary | ICD-10-CM

## 2017-11-25 LAB — BETA HCG QUANT (REF LAB): hCG Quant: 16852 m[IU]/mL

## 2017-11-25 NOTE — Progress Notes (Signed)
Released to mychart, plan for repeat in 48 hrs.

## 2017-11-26 ENCOUNTER — Other Ambulatory Visit: Payer: Self-pay | Admitting: Obstetrics and Gynecology

## 2017-11-26 ENCOUNTER — Other Ambulatory Visit: Payer: BLUE CROSS/BLUE SHIELD

## 2017-11-26 DIAGNOSIS — Z3201 Encounter for pregnancy test, result positive: Secondary | ICD-10-CM

## 2017-11-27 LAB — BETA HCG QUANT (REF LAB): hCG Quant: 15615 m[IU]/mL

## 2017-11-30 ENCOUNTER — Other Ambulatory Visit: Payer: Self-pay | Admitting: Advanced Practice Midwife

## 2017-11-30 DIAGNOSIS — Z3401 Encounter for supervision of normal first pregnancy, first trimester: Secondary | ICD-10-CM

## 2017-12-01 ENCOUNTER — Ambulatory Visit (INDEPENDENT_AMBULATORY_CARE_PROVIDER_SITE_OTHER): Payer: BLUE CROSS/BLUE SHIELD

## 2017-12-01 ENCOUNTER — Encounter: Payer: Self-pay | Admitting: Advanced Practice Midwife

## 2017-12-01 ENCOUNTER — Ambulatory Visit (INDEPENDENT_AMBULATORY_CARE_PROVIDER_SITE_OTHER): Payer: BLUE CROSS/BLUE SHIELD | Admitting: Advanced Practice Midwife

## 2017-12-01 ENCOUNTER — Other Ambulatory Visit: Payer: Self-pay | Admitting: Advanced Practice Midwife

## 2017-12-01 VITALS — BP 122/72 | Wt 237.0 lb

## 2017-12-01 DIAGNOSIS — Z3401 Encounter for supervision of normal first pregnancy, first trimester: Secondary | ICD-10-CM

## 2017-12-01 DIAGNOSIS — Z3A11 11 weeks gestation of pregnancy: Secondary | ICD-10-CM

## 2017-12-01 DIAGNOSIS — Z3490 Encounter for supervision of normal pregnancy, unspecified, unspecified trimester: Secondary | ICD-10-CM

## 2017-12-01 NOTE — Progress Notes (Signed)
Viability scan today is small gestational sac too small to calculate. Patient reports that at the beginning of the pregnancy she felt nausea, breast tenderness, fatigue. She now only has fatigue. Will do beta hcg level today and then repeat u/s in 2 weeks. Given the decrease in hormones at the last beta draw and still seeing just a small gestational sac, there is likely a missed abortion. Discussed this with patient. She verbalizes understanding and agrees with plan.

## 2017-12-01 NOTE — Progress Notes (Signed)
Pt reports no problems. Viability/dating scan today.

## 2017-12-02 ENCOUNTER — Other Ambulatory Visit: Payer: Self-pay | Admitting: Advanced Practice Midwife

## 2017-12-02 ENCOUNTER — Encounter (INDEPENDENT_AMBULATORY_CARE_PROVIDER_SITE_OTHER): Payer: Self-pay

## 2017-12-02 DIAGNOSIS — O021 Missed abortion: Secondary | ICD-10-CM

## 2017-12-02 LAB — BETA HCG QUANT (REF LAB): HCG QUANT: 11274 m[IU]/mL

## 2017-12-02 NOTE — Progress Notes (Signed)
Repeat beta hcg ordered for next week.

## 2017-12-09 ENCOUNTER — Other Ambulatory Visit: Payer: Self-pay | Admitting: Advanced Practice Midwife

## 2017-12-10 MED ORDER — VALACYCLOVIR HCL 500 MG PO TABS: 500.0000 mg | ORAL_TABLET | Freq: Two times a day (BID) | ORAL | 0 refills | Status: DC

## 2017-12-10 NOTE — Telephone Encounter (Signed)
Please advise 

## 2017-12-15 ENCOUNTER — Encounter: Payer: Self-pay | Admitting: Obstetrics & Gynecology

## 2017-12-15 ENCOUNTER — Ambulatory Visit (INDEPENDENT_AMBULATORY_CARE_PROVIDER_SITE_OTHER): Payer: BLUE CROSS/BLUE SHIELD

## 2017-12-15 ENCOUNTER — Ambulatory Visit (INDEPENDENT_AMBULATORY_CARE_PROVIDER_SITE_OTHER): Payer: BLUE CROSS/BLUE SHIELD | Admitting: Obstetrics & Gynecology

## 2017-12-15 VITALS — BP 120/80 | Wt 238.0 lb

## 2017-12-15 DIAGNOSIS — O039 Complete or unspecified spontaneous abortion without complication: Secondary | ICD-10-CM

## 2017-12-15 DIAGNOSIS — Z3490 Encounter for supervision of normal pregnancy, unspecified, unspecified trimester: Secondary | ICD-10-CM | POA: Diagnosis not present

## 2017-12-15 DIAGNOSIS — D219 Benign neoplasm of connective and other soft tissue, unspecified: Secondary | ICD-10-CM | POA: Diagnosis not present

## 2017-12-15 NOTE — Progress Notes (Signed)
  HPI: Pt has recent miscarriage, for follow up after bleeding.  Patient presents with findings of  uterine fibroids, has known about in past. Periods are generally regular, lasting a few days. Dysmenorrhea:mild, occurring throughout menses. Cyclic symptoms include none. No intermenstrual bleeding, spotting, or discharge.  Pt feels she completed her miscarriage one week ago w heavier bleeding and tissue at that time, since resolved.  Ultrasound demonstrates 1.5 cm SM fibroid.  Miscarriage complete as no POC.  PMHx: She  has a past medical history of Bacterial vaginosis, Cervical dysplasia (2002), Herpes, History of Papanicolaou smear of cervix (01/09/2015), HPV in female, Pre-diabetes (02/2016), Sickle cell trait ([place]), and Vitamin D deficiency (01/2016). Also,  has a past surgical history that includes No past surgeries., family history includes Cancer (age of onset: 65) in her paternal grandmother; Diabetes Mellitus II in her mother; Hypercholesterolemia in her father; Hypertension in her mother.,  reports that she has never smoked. She has never used smokeless tobacco. She reports that she drinks alcohol. She reports that she does not use drugs.  She has a current medication list which includes the following prescription(s): valacyclovir. Also, has No Known Allergies.  Review of Systems  All other systems reviewed and are negative.  Objective: BP 120/80   Wt 238 lb (108 kg)   LMP 09/13/2017 (Exact Date)   BMI 35.15 kg/m   Physical examination Constitutional NAD, Conversant  Skin No rashes, lesions or ulceration.   Extremities: Moves all appropriately.  Normal ROM for age. No lymphadenopathy.  Neuro: Grossly intact  Psych: Oriented to PPT.  Normal mood. Normal affect.   Assessment:  Fibroid    Fibroid treatment such as [place], Lupron, Myomectomy, and Hysterectomy discussed in detail, with the pros and cons of each choice counseled.  No treatment as an option also discussed, as well as  control of symptoms alone with hormone therapy. Information provided to the patient.  As fibroid is submucosal, consideration for hysteroscopic resection to aid in future pregnancy loss prevention discussed.   Miscarriage    Completed.  Monitor for cycles.  Cont PNV.    Desires future fertility  A total of 15 minutes were spent face-to-face with the patient during this encounter and over half of that time dealt with counseling and coordination of care.  Barnett Applebaum, MD, [facility], [facility] 12/15/2017  2:14 PM

## 2017-12-15 NOTE — Patient Instructions (Signed)
Myomectomy Myomectomy is surgery to remove a noncancerous tumor (myoma) from the uterus. Myomas are tumors made up of fibrous tissue. They are often called fibroid tumors. Fibroid tumors can range from the size of a pea to the size of a grapefruit. In a myomectomy, the fibroid tumor is removed without removing the uterus. Because these tumors are rarely cancerous, this surgery is usually done only if the tumor is growing or causing symptoms such as pain, pressure, bleeding, or pain with intercourse. LET YOUR HEALTH CARE PROVIDER KNOW ABOUT:  Any allergies you have.  All medicines you are taking, including vitamins, herbs, eye drops, creams, and over-the-counter medicines.  Previous problems you or members of your family have had with the use of anesthetics.  Any blood disorders you have.  Previous surgeries you have had.  Medical conditions you have. RISKS AND COMPLICATIONS Generally, this is a safe procedure. However, as with any procedure, complications can occur. Possible complications include:  Excessive bleeding.  Infection.  Injury to nearby organs.  Blood clots in the legs, chest, or brain.  Scar tissue on other organs and in the pelvis. This may require another surgery to remove the scar tissue.  BEFORE THE PROCEDURE  Ask your health care provider about changing or stopping your regular medicines. Avoid taking aspirin or blood thinners as directed by your health care provider.  Do not  eat or drink anything after midnight on the night before surgery.  If you smoke, do not  smoke for 2 weeks before the surgery.  Do not  drink alcohol the day before the surgery.  Arrange for someone to drive you home after the procedure or after your hospital stay. Also arrange for someone to help you with activities during your recovery. PROCEDURE You will be given medicine to make you sleep through the procedure (general anesthetic). Any of the following methods may be used to perform  a myomectomy:  Small monitors will be put on your body. They are used to check your heart, blood pressure, and oxygen level.  An IV access tube will be put into one of your veins. Medicine will be able to flow directly into your body through this IV tube.  You might be given a medicine to help you relax (sedative).  You will be given a medicine to make you sleep (general anesthetic). A breathing tube will be placed into your lungs during the procedure.  A thin, flexible tube (catheter) will be inserted into your bladder to collect urine.  Any of the following methods may be used to perform a myomectomy: ? Hysteroscopic myomectomy-This method may be used when the fibroid tumor is inside the cavity of the uterus. A long, thin tube that is like a telescope (hysteroscope) is inserted inside the uterus. A saline solution is put into your uterus. This expands the uterus and allows the surgeon to see the fibroids. Tools are passed through the hysteroscope to remove the fibroid tumor in pieces. ? Laparoscopic myomectomy-A few small cuts (incisions) are made in the lower abdomen. A thin, lighted tube with a tiny camera on the end (laparoscope) is inserted through one of the incisions. This gives the surgeon a good view of the area. The fibroid tumor is removed through the other incisions. The incisions are then closed with stitches (sutures) or staples. ? Abdominal myomectomy-This method is used when the fibroid tumor cannot be removed with a hysteroscope or laparoscope. The surgery is performed through a larger surgical incision in the abdomen. The   fibroid tumor is removed through this incision. The incision is closed with sutures or staples.  What to expect after the procedure  If you had a laparoscopic or hysteroscopic myomectomy, you may be able to go home the same day, or you may need to stay in the hospital overnight.  If you had an abdominal myomectomy, you may need to stay in the hospital for a  few days.  Your IV access tube and catheter will be removed in 1-2 days.  You may be given medicine for pain or to help you sleep.  You may be given an antibiotic medicine, if needed. This information is not intended to replace advice given to you by your health care provider. Make sure you discuss any questions you have with your health care provider. Document Released: 04/27/2007 Document Revised: 12/06/2015 Document Reviewed: 02/09/2013 Elsevier Interactive Patient Education  2017 [employer]  

## 2018-05-19 ENCOUNTER — Other Ambulatory Visit: Payer: Self-pay | Admitting: Obstetrics and Gynecology

## 2018-05-19 DIAGNOSIS — Z369 Encounter for antenatal screening, unspecified: Secondary | ICD-10-CM

## 2018-05-31 ENCOUNTER — Other Ambulatory Visit: Payer: Self-pay

## 2018-05-31 DIAGNOSIS — Z369 Encounter for antenatal screening, unspecified: Secondary | ICD-10-CM

## 2018-06-03 ENCOUNTER — Ambulatory Visit
Admission: RE | Admit: 2018-06-03 | Discharge: 2018-06-03 | Disposition: A | Discharge status: Home / Self Care | Payer: Self-pay | Source: Ambulatory Visit | Attending: Obstetrics and Gynecology | Admitting: Obstetrics and Gynecology

## 2018-06-03 ENCOUNTER — Ambulatory Visit: Payer: Self-pay

## 2018-06-03 VITALS — BP 136/88 | HR 112 | Temp 98.2°F | Resp 18 | Ht 69.6 in | Wt 237.8 lb

## 2018-06-03 DIAGNOSIS — O09521 Supervision of elderly multigravida, first trimester: Secondary | ICD-10-CM

## 2018-06-03 DIAGNOSIS — Z369 Encounter for antenatal screening, unspecified: Secondary | ICD-10-CM

## 2018-06-03 DIAGNOSIS — Z3689 Encounter for other specified antenatal screening: Secondary | ICD-10-CM | POA: Insufficient documentation

## 2018-06-03 DIAGNOSIS — Z3A12 12 weeks gestation of pregnancy: Secondary | ICD-10-CM | POA: Insufficient documentation

## 2018-06-03 DIAGNOSIS — O09522 Supervision of elderly multigravida, second trimester: Secondary | ICD-10-CM | POA: Insufficient documentation

## 2018-06-03 NOTE — Progress Notes (Addendum)
Referring Provider:   [facility] Ob/[facility], Hassan Buckler Length of Consultation: 40 minutes  Amber Price was referred to [place] for genetic counseling because of advanced maternal age.  The patient will be 37 years old at the time of delivery.  This note summarizes the information we discussed.    We explained that the chance of a chromosome abnormality increases with maternal age.  Chromosomes and examples of chromosome problems were reviewed.  Humans typically have 46 chromosomes in each cell, with half passed through each sperm and egg.  Any change in the number or structure of chromosomes can increase the risk of problems in the physical and mental development of a pregnancy.   Based upon age of the patient, the chance of any chromosome abnormality was 1 in 58. The chance of Down syndrome, the most common chromosome problem associated with maternal age, was 1 in 38.  The risk of chromosome problems is in addition to the 3% general population risk for birth defects and mental retardation.  The greatest chance, of course, is that the baby would be born in good health.  We discussed the following prenatal screening and testing options for this pregnancy:  First trimester screening, which includes nuchal translucency ultrasound screen and first trimester maternal serum marker screening.  The nuchal translucency has approximately an 80% detection rate for Down syndrome and can be positive for other chromosome abnormalities as well as heart defects.  When combined with a maternal serum marker screening, the detection rate is up to 90% for Down syndrome and up to 97% for trisomy 18.     The chorionic villus sampling procedure is available for first trimester chromosome analysis.  This involves the withdrawal of a small amount of chorionic villi (tissue from the developing placenta).  Risk of pregnancy loss is estimated to be approximately 1 in 200 to 1 in 100 (0.5 to  1%).  There is approximately a 1% (1 in 100) chance that the CVS chromosome results will be unclear.  Chorionic villi cannot be tested for neural tube defects.     Maternal serum marker screening, a blood test that measures pregnancy proteins, can provide risk assessments for Down syndrome, trisomy 18, and open neural tube defects (spina bifida, anencephaly). Because it does not directly examine the fetus, it cannot positively diagnose or rule out these problems.  Targeted ultrasound uses high frequency sound waves to create an image of the developing fetus.  An ultrasound is often recommended as a routine means of evaluating the pregnancy.  It is also used to screen for fetal anatomy problems (for example, a heart defect) that might be suggestive of a chromosomal or other abnormality.   Amniocentesis involves the removal of a small amount of amniotic fluid from the sac surrounding the fetus with the use of a thin needle inserted through the maternal abdomen and uterus.  Ultrasound guidance is used throughout the procedure.  Fetal cells from amniotic fluid are directly evaluated and > 99.5% of chromosome problems and > 98% of open neural tube defects can be detected. This procedure is generally performed after the 15th week of pregnancy.  The main risks to this procedure include complications leading to miscarriage in less than 1 in 200 cases (0.5%).  We also reviewed the availability of cell free fetal DNA testing from maternal blood to determine whether or not the baby may have either Down syndrome, trisomy 55, or trisomy 84.  This test utilizes a maternal blood  sample and DNA sequencing technology to isolate circulating cell free fetal DNA from maternal plasma.  The fetal DNA can then be analyzed for DNA sequences that are derived from the three most common chromosomes involved in aneuploidy, chromosomes 13, 18, and 21.  If the overall amount of DNA is greater than the expected level for any of these  chromosomes, aneuploidy is suspected.  While we do not consider it a replacement for invasive testing and karyotype analysis, a negative result from this testing would be reassuring, though not a guarantee of a normal chromosome complement for the baby.  An abnormal result is certainly suggestive of an abnormal chromosome complement, though we would still recommend CVS or amniocentesis to confirm any findings from this testing.  Cystic Fibrosis and Spinal Muscular Atrophy (SMA) screening were also discussed with the patient. Both conditions are recessive, which means that both parents must be carriers in order to have a child with the disease.  Cystic fibrosis (CF) is one of the most common genetic conditions in persons of Caucasian ancestry.  This condition occurs in approximately 1 in 2,500 Caucasian persons and results in thickened secretions in the lungs, digestive, and reproductive systems.  For a baby to be at risk for having CF, both of the parents must be carriers for this condition.  Approximately 1 in 37 Caucasian persons is a carrier for CF.  Current carrier testing looks for the most common mutations in the gene for CF and can detect approximately 90% of carriers in the Caucasian population.  This means that the carrier screening can greatly reduce, but cannot eliminate, the chance for an individual to have a child with CF.  If an individual is found to be a carrier for CF, then carrier testing would be available for the partner. As part of [place] newborn screening profile, all babies born in the state of [place] will have a two-tier screening process.  Specimens are first tested to determine the concentration of immunoreactive trypsinogen (IRT).  The top 5% of specimens with the highest IRT values then undergo DNA testing using a panel of over 40 common CF mutations. SMA is a neurodegenerative disorder that leads to atrophy of skeletal muscle and overall weakness.  This condition is  also more prevalent in the Caucasian population, with 1 in 40-1 in 60 persons being a carrier and 1 in 6,000-1 in 10,000 children being affected.  There are multiple forms of the disease, with some causing death in infancy to other forms with survival into adulthood.  The genetics of SMA is complex, but carrier screening can detect up to 95% of carriers in the Caucasian population.  Similar to CF, a negative result can greatly reduce, but cannot eliminate, the chance to have a child with SMA.  We obtained a detailed family history and pregnancy history.  Amber Price reported one paternal first cousin who had a child with some type of birth difference who passed away at 70-9 years old.  She knows no details about the type of health or developmental concerns, but was encouraged to contact us if she learns anything about this cousin so that we may provide risk assessment. The remainder of the family history is unremarkable for birth defects, developmental delays, recurrent pregnancy loss or known chromosome abnormalities.  Amber Price stated that this is her 5th pregnancy. She has a 52 year old son from a prior relationship who is in good health.  Together with her partner, she has a healthy  52 year old son and has had two early miscarriages, one of which was a twin gestation. She reported no complications or exposures that would be expected to increase to risk for birth defects.  Amber Price did report that she is a carrier for sickle cell trait.  I saw no test results to confirm this, but she is confident of her results and that her son, Cecille Aver, is also a carrier based upon newborn screening.  We offered testing on the father of the pregnancy, which she declined. She is aware that newborn screening will test this baby as well.  After consideration of the options, Amber Price elected to proceed with an ultrasound only and to decline all other screening and testing options. She also declined to have the father of the baby  tested for hemoglobinopathies.  An ultrasound was performed at the time of the visit.  The gestational age was consistent with 12 weeks, 6 days.  Fetal anatomy could not be assessed due to early gestational age.  Please refer to the ultrasound report for details of that study.  Amber Price was encouraged to call with questions or concerns.  We can be contacted at [phone].   Tests Ordered: none  Wilburt Finlay, MS, CGC  I met with the patient and reviewed the summary as provided by Ms Landry Mellow, MD

## 2018-07-14 NOTE — L&D Delivery Note (Signed)
Date of delivery: 11/28/2018  Estimated Date of Delivery: 12/10/18 Patient's last menstrual period was 03/05/2018. EGA: [redacted]w[redacted]d  Delivery Note Pt presented to L&D in active spontaneous labor. Pt progressed to C/C/0 and pushed effectively with 4 UCs. Delivery of fetal head in OA position with restitution to LOA.  Anterior then posterior shoulders delivered easily with gentle downward traction.  Baby placed on mom's chest, and attended to by peds.  Cord was doubly clamped and cut when pulseless.  Placenta spontaneously delivered, intact.  IV pitocin given for hemorrhage prophylaxis. Perineal laceration was identified and repaired in usual fashion to achieve hemostasis and cosmesis. Fundus was noted to be firm with small amt lochia. Mother and infant remain in birthing suite in stable condition.   At 8:58 PM a viable female was delivered via Vaginal, Spontaneous (Presentation: OA).  APGAR: 9/9; weight 6#6oz Placenta status: spontaneous, intact.  Cord: No nuchal cord.   Anesthesia:  local Episiotomy: None Lacerations:  1st deg perineal lac Suture Repair: 3.0 vicryl SH Est. Blood Loss (mL): 200  Mom to postpartum.  Baby to Couplet care / Skin to Skin.

## 2018-07-28 ENCOUNTER — Other Ambulatory Visit: Payer: Self-pay

## 2018-07-28 ENCOUNTER — Emergency Department: Payer: BLUE CROSS/BLUE SHIELD

## 2018-07-28 ENCOUNTER — Encounter: Payer: Self-pay | Admitting: Emergency Medicine

## 2018-07-28 ENCOUNTER — Emergency Department
Admission: EM | Admit: 2018-07-28 | Discharge: 2018-07-28 | Disposition: A | Discharge status: Home / Self Care | Payer: BLUE CROSS/BLUE SHIELD | Attending: Emergency Medicine | Admitting: Emergency Medicine

## 2018-07-28 DIAGNOSIS — Z7982 Long term (current) use of aspirin: Secondary | ICD-10-CM | POA: Insufficient documentation

## 2018-07-28 DIAGNOSIS — Z79899 Other long term (current) drug therapy: Secondary | ICD-10-CM | POA: Insufficient documentation

## 2018-07-28 DIAGNOSIS — R2243 Localized swelling, mass and lump, lower limb, bilateral: Secondary | ICD-10-CM | POA: Insufficient documentation

## 2018-07-28 DIAGNOSIS — R000 Tachycardia, unspecified: Secondary | ICD-10-CM | POA: Diagnosis present

## 2018-07-28 DIAGNOSIS — J101 Influenza due to other identified influenza virus with other respiratory manifestations: Secondary | ICD-10-CM | POA: Diagnosis not present

## 2018-07-28 LAB — COMPREHENSIVE METABOLIC PANEL
ALT: 41 U/L (ref 0–44)
AST: 36 U/L (ref 15–41)
Albumin: 3.2 g/dL — ABNORMAL LOW (ref 3.5–5.0)
Alkaline Phosphatase: 51 U/L (ref 38–126)
Anion gap: 10 (ref 5–15)
BUN: 5 mg/dL — ABNORMAL LOW (ref 6–20)
CALCIUM: 8.6 mg/dL — AB (ref 8.9–10.3)
CHLORIDE: 104 mmol/L (ref 98–111)
CO2: 20 mmol/L — ABNORMAL LOW (ref 22–32)
CREATININE: 0.68 mg/dL (ref 0.44–1.00)
Glucose, Bld: 102 mg/dL — ABNORMAL HIGH (ref 70–99)
Potassium: 3.6 mmol/L (ref 3.5–5.1)
Sodium: 134 mmol/L — ABNORMAL LOW (ref 135–145)
Total Bilirubin: 0.5 mg/dL (ref 0.3–1.2)
Total Protein: 6.9 g/dL (ref 6.5–8.1)

## 2018-07-28 LAB — URINALYSIS, COMPLETE (UACMP) WITH MICROSCOPIC
Bacteria, UA: NONE SEEN
Bilirubin Urine: NEGATIVE
GLUCOSE, UA: NEGATIVE mg/dL
Hgb urine dipstick: NEGATIVE
Ketones, ur: 20 mg/dL — AB
Leukocytes, UA: NEGATIVE
Nitrite: NEGATIVE
PH: 7 (ref 5.0–8.0)
Protein, ur: NEGATIVE mg/dL
Specific Gravity, Urine: 1.01 (ref 1.005–1.030)

## 2018-07-28 LAB — CBC WITH DIFFERENTIAL/PLATELET
Abs Immature Granulocytes: 0.03 10*3/uL (ref 0.00–0.07)
Basophils Absolute: 0 10*3/uL (ref 0.0–0.1)
Basophils Relative: 0 %
Eosinophils Absolute: 0 10*3/uL (ref 0.0–0.5)
Eosinophils Relative: 0 %
HCT: 31.1 % — ABNORMAL LOW (ref 36.0–46.0)
Hemoglobin: 10.5 g/dL — ABNORMAL LOW (ref 12.0–15.0)
Immature Granulocytes: 0 %
LYMPHS ABS: 0.5 10*3/uL — AB (ref 0.7–4.0)
LYMPHS PCT: 7 %
MCH: 26.5 pg (ref 26.0–34.0)
MCHC: 33.8 g/dL (ref 30.0–36.0)
MCV: 78.5 fL — AB (ref 80.0–100.0)
MONOS PCT: 7 %
Monocytes Absolute: 0.5 10*3/uL (ref 0.1–1.0)
Neutro Abs: 6.7 10*3/uL (ref 1.7–7.7)
Neutrophils Relative %: 86 %
Platelets: 236 10*3/uL (ref 150–400)
RBC: 3.96 MIL/uL (ref 3.87–5.11)
RDW: 14.6 % (ref 11.5–15.5)
WBC: 7.8 10*3/uL (ref 4.0–10.5)
nRBC: 0 % (ref 0.0–0.2)

## 2018-07-28 LAB — INFLUENZA PANEL BY PCR (TYPE A & B)
INFLAPCR: POSITIVE — AB
INFLBPCR: NEGATIVE

## 2018-07-28 LAB — TSH: TSH: 0.532 u[IU]/mL (ref 0.350–4.500)

## 2018-07-28 LAB — T4, FREE: Free T4: 0.71 ng/dL — ABNORMAL LOW (ref 0.82–1.77)

## 2018-07-28 LAB — TROPONIN I

## 2018-07-28 MED ORDER — SODIUM CHLORIDE 0.9 % IV BOLUS
1000.0000 mL | Freq: Once | INTRAVENOUS | Status: AC
  Administered 2018-07-28: 1000 mL via INTRAVENOUS

## 2018-07-28 MED ORDER — OSELTAMIVIR PHOSPHATE 75 MG PO CAPS
75.0000 mg | ORAL_CAPSULE | Freq: Once | ORAL | Status: AC
  Administered 2018-07-28: 75 mg via ORAL
  Filled 2018-07-28: qty 1

## 2018-07-28 MED ORDER — OSELTAMIVIR PHOSPHATE 75 MG PO CAPS: 75.0000 mg | ORAL_CAPSULE | Freq: Two times a day (BID) | ORAL | 0 refills | Status: AC

## 2018-07-28 MED ORDER — ONDANSETRON HCL 4 MG/2ML IJ SOLN
4.0000 mg | Freq: Once | INTRAMUSCULAR | Status: AC
  Administered 2018-07-28: 4 mg via INTRAVENOUS
  Filled 2018-07-28: qty 2

## 2018-07-28 MED ORDER — ACETAMINOPHEN 500 MG PO TABS
1000.0000 mg | ORAL_TABLET | Freq: Once | ORAL | Status: AC
  Administered 2018-07-28: 1000 mg via ORAL
  Filled 2018-07-28: qty 2

## 2018-07-28 NOTE — ED Notes (Signed)
Patient verbalized understanding of discharge instructions, no questions. Patient ambulated out of ED with steady gait in no distress.  

## 2018-07-28 NOTE — ED Triage Notes (Signed)
Felt like heart racing. HR 160 at check in. DOE. Taken straight to room. 21 weeks. [id number].

## 2018-07-28 NOTE — ED Provider Notes (Signed)
[facility] Emergency Department Provider Note  ____________________________________________  Time seen: Approximately 2:46 PM  I have reviewed the triage vital signs and the nursing notes.   HISTORY  Chief Complaint Tachycardia   HPI Amber Price is a 38 y.o. female no significant past medical history currently [redacted] weeks gestational age (G5, P2 A2) who presents for evaluation of chest pain and palpitations.  Patient reports 2 days of a dry cough and low-grade fever of 25F.  Today she started feeling her heart racing.  She is complaining of mild achiness throughout her chest.  No pleuritic chest pain.  No shortness of breath.  No personal family history of blood clots, recent travel immobilization, hemoptysis, leg swelling, or history of cancer.  No known sick contact exposures.  No sore throat, abdominal pain, loss of fluid per vagina, vaginal bleeding, dysuria or hematuria, vomiting or diarrhea.  Patient does report since yesterday decreased appetite and has not been eating and drinking as much.  She is feeling the baby move.  There is no complications with this pregnancy or the last 2 pregnancies she has had.  Past Medical History:  Diagnosis Date  . Bacterial vaginosis   . Cervical dysplasia 2002   [place]  . Herpes   . History of Papanicolaou smear of cervix 01/09/2015   -/-  . 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 Pre-diabetes 02/2016   DIET, EXERCISE, WT LOSS. RECK 2018 ANNUAL  . Sickle cell trait ([place])   . Vitamin D deficiency 01/2016   ADD VIT D3 5000 IU DAILY    Patient Active Problem List   Diagnosis Date Noted  . Advanced maternal age in multigravida, first trimester   . Fibroid 12/15/2017  . History of herpes genitalis 07/03/2017  . BMI 35.0-35.9,adult 07/03/2017    Past Surgical History:  Procedure Laterality Date  . NO PAST SURGERIES      Prior to Admission medications   Medication Sig Start Date End Date Taking? Authorizing  Provider  aspirin 81 MG chewable tablet Chew 81 mg by mouth daily.   Yes [provider]  folic acid (FOLVITE) 1 MG tablet Take 1 mg by mouth daily.   Yes [provider]  Prenatal Vit-Fe Fumarate-FA (PRENATAL MULTIVITAMIN) TABS tablet Take 1 tablet by mouth daily at 12 noon.   Yes [provider]  valACYclovir (VALTREX) 500 MG tablet Take 1 tablet (500 mg total) by mouth 2 (two) times daily. 12/10/17  Yes Rod Can, CNM  oseltamivir (TAMIFLU) 75 MG capsule Take 1 capsule (75 mg total) by mouth 2 (two) times daily for 5 days. 07/28/18 08/02/18  Rudene Re, MD    Allergies Patient has no known allergies.  Family History  Problem Relation Age of Onset  . Diabetes Mellitus II Mother   . Hypertension Mother   . Hypercholesterolemia Father   . Cancer Paternal Grandmother 69       LUNG    Social History Social History   Tobacco Use  . Smoking status: Never Smoker  . Smokeless tobacco: Never Used  Substance Use Topics  . Alcohol use: Not Currently  . Drug use: No    Review of Systems  Constitutional: + low grade fever. Eyes: Negative for visual changes. ENT: Negative for sore throat. Neck: No neck pain  Cardiovascular: +chest pain. Respiratory: Negative for shortness of breath. + cough Gastrointestinal: Negative for abdominal pain, vomiting or diarrhea. + decreased appetite Genitourinary: Negative for dysuria.  Musculoskeletal: Negative for back pain. Skin: Negative for rash. Neurological: Negative for headaches, weakness or numbness. Psych: No SI or HI  ____________________________________________   PHYSICAL EXAM:  VITAL SIGNS: ED Triage Vitals  Enc Vitals Group     BP 07/28/18 1357 (!) 142/83     Pulse Rate 07/28/18 1357 (!) 144     Resp 07/28/18 1357 20     Temp 07/28/18 1357 98.9 F (37.2 C)     Temp Source 07/28/18 1357 Oral     SpO2 07/28/18 1357 97 %     Weight 07/28/18 1404 244 lb (110.7 kg)     Height 07/28/18 1359 5'  9" (1.753 m)     Head Circumference --      Peak Flow --      Pain Score 07/28/18 1359 0     Pain Loc --      Pain Edu? --      Excl. in [place]? --     Constitutional: Alert and oriented. Well appearing and in no apparent distress. HEENT:      Head: Normocephalic and atraumatic.         Eyes: Conjunctivae are normal. Sclera is non-icteric.       Mouth/Throat: Mucous membranes are moist.       Neck: Supple with no signs of meningismus. Cardiovascular: Tachycardic with regular rhythm. No murmurs, gallops, or rubs. 2+ symmetrical distal pulses are present in all extremities. No JVD. Respiratory: Normal respiratory effort. Lungs are clear to auscultation bilaterally. No wheezes, crackles, or rhonchi.  Gastrointestinal: Gravid, non tender, and non distended with positive bowel sounds. No rebound or guarding. Musculoskeletal: Nontender with normal range of motion in all extremities. No edema, cyanosis, or erythema of extremities. Neurologic: Normal speech and language. Face is symmetric. Moving all extremities. No gross focal neurologic deficits are appreciated. Skin: Skin is warm, dry and intact. No rash noted. Psychiatric: Mood and affect are normal. Speech and behavior are normal.  ____________________________________________   LABS (all labs ordered are listed, but only abnormal results are displayed)  Labs Reviewed  CBC WITH DIFFERENTIAL/PLATELET - Abnormal; Notable for the following components:      Result Value   Hemoglobin 10.5 (*)    HCT 31.1 (*)    MCV 78.5 (*)    Lymphs Abs 0.5 (*)    All other components within normal limits  COMPREHENSIVE METABOLIC PANEL - Abnormal; Notable for the following components:   Sodium 134 (*)    CO2 20 (*)    Glucose, Bld 102 (*)    BUN 5 (*)    Calcium 8.6 (*)    Albumin 3.2 (*)    All other components within normal limits  URINALYSIS, COMPLETE (UACMP) WITH MICROSCOPIC - Abnormal; Notable for the following components:   Color, Urine YELLOW  (*)    APPearance CLEAR (*)    Ketones, ur 20 (*)    All other components within normal limits  T4, FREE - Abnormal; Notable for the following components:   Free T4 0.71 (*)    All other components within normal limits  INFLUENZA PANEL BY PCR (TYPE A & B) - Abnormal; Notable for the following components:   Influenza A By PCR POSITIVE (*)    All other components within normal limits  TROPONIN I  TSH   ____________________________________________  EKG  ED ECG REPORT I, Rudene Re, the attending physician, personally viewed and interpreted this ECG.  Sinus tachycardia, rate of 142, borderline prolonged QTC, normal axis,  no ST elevations or depressions.  No prior for comparison ____________________________________________  RADIOLOGY  I have personally reviewed the images performed during this visit and I agree with the Radiologist's read.   Interpretation by Radiologist:  Dg Chest 2 View  Result Date: 07/28/2018 CLINICAL DATA:  Cardiac palpitations. EXAM: CHEST - 2 VIEW COMPARISON:  None. FINDINGS: The heart size and mediastinal contours are within normal limits. Both lungs are clear. No pneumothorax or pleural effusion is noted. The visualized skeletal structures are unremarkable. IMPRESSION: No active cardiopulmonary disease. Electronically Signed   By: Marijo Conception, M.D.   On: 07/28/2018 14:44   US Venous Img Lower Bilateral  Result Date: 07/28/2018 CLINICAL DATA:  Bilateral lower extremity pain and edema. EXAM: BILATERAL LOWER EXTREMITY VENOUS DOPPLER ULTRASOUND TECHNIQUE: Gray-scale sonography with graded compression, as well as color Doppler and duplex ultrasound were performed to evaluate the lower extremity deep venous systems from the level of the common femoral vein and including the common femoral, femoral, profunda femoral, popliteal and calf veins including the posterior tibial, peroneal and gastrocnemius veins when visible. The superficial great saphenous vein  was also interrogated. Spectral Doppler was utilized to evaluate flow at rest and with distal augmentation maneuvers in the common femoral, femoral and popliteal veins. COMPARISON:  None. FINDINGS: RIGHT LOWER EXTREMITY Common Femoral Vein: No evidence of thrombus. Normal compressibility, respiratory phasicity and response to augmentation. Saphenofemoral Junction: No evidence of thrombus. Normal compressibility and flow on color Doppler imaging. Profunda Femoral Vein: No evidence of thrombus. Normal compressibility and flow on color Doppler imaging. Femoral Vein: No evidence of thrombus. Normal compressibility, respiratory phasicity and response to augmentation. Popliteal Vein: No evidence of thrombus. Normal compressibility, respiratory phasicity and response to augmentation. Calf Veins: No evidence of thrombus. Normal compressibility and flow on color Doppler imaging. Superficial Great Saphenous Vein: No evidence of thrombus. Normal compressibility. Venous Reflux:  None. Other Findings: No evidence of superficial thrombophlebitis or abnormal fluid collection. LEFT LOWER EXTREMITY Common Femoral Vein: No evidence of thrombus. Normal compressibility, respiratory phasicity and response to augmentation. Saphenofemoral Junction: No evidence of thrombus. Normal compressibility and flow on color Doppler imaging. Profunda Femoral Vein: No evidence of thrombus. Normal compressibility and flow on color Doppler imaging. Femoral Vein: No evidence of thrombus. Normal compressibility, respiratory phasicity and response to augmentation. Popliteal Vein: No evidence of thrombus. Normal compressibility, respiratory phasicity and response to augmentation. Calf Veins: No evidence of thrombus. Normal compressibility and flow on color Doppler imaging. Superficial Great Saphenous Vein: No evidence of thrombus. Normal compressibility. Venous Reflux:  None. Other Findings: No evidence of superficial thrombophlebitis or abnormal fluid  collection. IMPRESSION: No evidence of bilateral lower extremity deep venous thrombosis. Electronically Signed   By: Aletta Edouard M.D.   On: 07/28/2018 15:53     ____________________________________________   PROCEDURES  Procedure(s) performed: None Procedures Critical Care performed:  None ____________________________________________   INITIAL IMPRESSION / ASSESSMENT AND PLAN / ED COURSE   38 y.o. female no significant past medical history currently [redacted] weeks gestational age (G5, P2 A2) who presents for evaluation of chest pain and palpitations.  Differential diagnoses including pneumonia versus viral illness versus dehydration versus flu versus pulmonary embolism  Patient arrives to the emergency room what it sounds like a viral illness with cough, mild dull ache in her chest and low-grade fever for 2 days.  She also endorses decreased appetite and possibly could be slightly dehydrated.  Her EKG shows sinus tachycardia with a rate of 142 and no  ischemic changes.  Obviously PE is in the differential diagnosis as well considering the patient is pregnant.  Chest x-ray has been done and that is negative for pneumonia.  Flu swab is pending. Will give zofran for nausea and IVF.  Bedside ultrasound showing great fetal activity with fetal heart rate of 162  Clinical Course as of Jul 28 1818  Wed Jul 28, 2018  1607 Chest x-ray with no evidence of pneumonia.  Patient is influenza A positive.  Doppler studies of bilateral lower extremities are negative for DVT therefore suspicion for PE is very low at this time.  Patient's heart rate has improved from 150 to the 120s after liter fluid.  Will give a second liter fluid.  Patient reports resolution of her palpitations.  Will start patient on Tamiflu.  Patient remains with normal work of breathing, no tachypnea, no hypoxia.   [CV]  5520 HR 104 at this time.  Patient no longer having any chest discomfort or palpitations.  Tolerating p.o.  Will  discharge home with increase oral hydration, Tamiflu, and close follow-up with primary care doctor.  Discussed standard return precautions   [CV]    Clinical Course User Index [CV] Alfred Levins [place], MD     As part of my medical decision making, I reviewed the following data within the [facility] notes reviewed and incorporated, Labs reviewed , EKG interpreted , Old EKG reviewed, Old chart reviewed, Radiograph reviewed , Notes from prior ED visits and [place] Controlled Substance Database    Pertinent labs & imaging results that were available during my care of the patient were reviewed by me and considered in my medical decision making (see chart for details).    ____________________________________________   FINAL CLINICAL IMPRESSION(S) / ED DIAGNOSES  Final diagnoses:  Influenza A      NEW MEDICATIONS STARTED DURING THIS VISIT:  ED Discharge Orders         Ordered    oseltamivir (TAMIFLU) 75 MG capsule  2 times daily     07/28/18 1721           Note:  This document was prepared using Dragon voice recognition software and may include unintentional dictation errors.    Alfred Levins, [place], MD 07/28/18 1820

## 2018-09-12 ENCOUNTER — Encounter (HOSPITAL_COMMUNITY): Payer: Self-pay

## 2018-11-28 ENCOUNTER — Inpatient Hospital Stay
Admission: EM | Admit: 2018-11-28 | Discharge: 2018-11-29 | DRG: 807 | Disposition: A | Discharge status: Home / Self Care | Payer: BLUE CROSS/BLUE SHIELD | Attending: Obstetrics and Gynecology | Admitting: Obstetrics and Gynecology

## 2018-11-28 ENCOUNTER — Other Ambulatory Visit: Payer: Self-pay

## 2018-11-28 DIAGNOSIS — O26893 Other specified pregnancy related conditions, third trimester: Secondary | ICD-10-CM | POA: Diagnosis present

## 2018-11-28 DIAGNOSIS — Z3A38 38 weeks gestation of pregnancy: Secondary | ICD-10-CM

## 2018-11-28 DIAGNOSIS — O9902 Anemia complicating childbirth: Secondary | ICD-10-CM | POA: Diagnosis present

## 2018-11-28 DIAGNOSIS — E669 Obesity, unspecified: Secondary | ICD-10-CM | POA: Diagnosis present

## 2018-11-28 DIAGNOSIS — O99214 Obesity complicating childbirth: Secondary | ICD-10-CM | POA: Diagnosis present

## 2018-11-28 DIAGNOSIS — O9832 Other infections with a predominantly sexual mode of transmission complicating childbirth: Secondary | ICD-10-CM | POA: Diagnosis present

## 2018-11-28 DIAGNOSIS — A6000 Herpesviral infection of urogenital system, unspecified: Secondary | ICD-10-CM | POA: Diagnosis present

## 2018-11-28 DIAGNOSIS — Z1159 Encounter for screening for other viral diseases: Secondary | ICD-10-CM | POA: Diagnosis not present

## 2018-11-28 DIAGNOSIS — D573 Sickle-cell trait: Secondary | ICD-10-CM | POA: Diagnosis present

## 2018-11-28 LAB — CBC
HCT: 35 % — ABNORMAL LOW (ref 36.0–46.0)
Hemoglobin: 11.6 g/dL — ABNORMAL LOW (ref 12.0–15.0)
MCH: 26.9 pg (ref 26.0–34.0)
MCHC: 33.1 g/dL (ref 30.0–36.0)
MCV: 81 fL (ref 80.0–100.0)
Platelets: 200 10*3/uL (ref 150–400)
RBC: 4.32 MIL/uL (ref 3.87–5.11)
RDW: 14.2 % (ref 11.5–15.5)
WBC: 12.3 10*3/uL — ABNORMAL HIGH (ref 4.0–10.5)
nRBC: 0 % (ref 0.0–0.2)

## 2018-11-28 LAB — TYPE AND SCREEN
ABO/RH(D): O POS
Antibody Screen: NEGATIVE

## 2018-11-28 LAB — OB RESULTS CONSOLE GBS: GBS: NEGATIVE

## 2018-11-28 LAB — OB RESULTS CONSOLE VARICELLA ZOSTER ANTIBODY, IGG: Varicella: IMMUNE

## 2018-11-28 LAB — OB RESULTS CONSOLE RUBELLA ANTIBODY, IGM: Rubella: NON-IMMUNE/NOT IMMUNE

## 2018-11-28 MED ORDER — OXYTOCIN 40 UNITS IN NORMAL SALINE INFUSION - SIMPLE MED
INTRAVENOUS | Status: AC
  Administered 2018-11-28: 500 mL via INTRAVENOUS
  Filled 2018-11-28: qty 1000

## 2018-11-28 MED ORDER — DIPHENHYDRAMINE HCL 25 MG PO CAPS: 25.0000 mg | ORAL_CAPSULE | Freq: Four times a day (QID) | ORAL | Status: DC | PRN

## 2018-11-28 MED ORDER — ACETAMINOPHEN 325 MG PO TABS: 650.0000 mg | ORAL_TABLET | ORAL | Status: DC | PRN

## 2018-11-28 MED ORDER — BENZOCAINE-MENTHOL 20-0.5 % EX AERO: 1.0000 "application " | INHALATION_SPRAY | CUTANEOUS | Status: DC | PRN

## 2018-11-28 MED ORDER — COCONUT OIL OIL: 1.0000 "application " | TOPICAL_OIL | Status: DC | PRN

## 2018-11-28 MED ORDER — DIBUCAINE (PERIANAL) 1 % EX OINT: 1.0000 "application " | TOPICAL_OINTMENT | CUTANEOUS | Status: DC | PRN

## 2018-11-28 MED ORDER — WITCH HAZEL-GLYCERIN EX PADS: 1.0000 "application " | MEDICATED_PAD | CUTANEOUS | Status: DC | PRN

## 2018-11-28 MED ORDER — SIMETHICONE 80 MG PO CHEW: 80.0000 mg | CHEWABLE_TABLET | ORAL | Status: DC | PRN

## 2018-11-28 MED ORDER — LACTATED RINGERS IV SOLN
INTRAVENOUS | Status: DC
  Administered 2018-11-28: 21:00:00 via INTRAVENOUS

## 2018-11-28 MED ORDER — VALACYCLOVIR HCL 500 MG PO TABS
500.0000 mg | ORAL_TABLET | Freq: Two times a day (BID) | ORAL | Status: DC
  Filled 2018-11-28 (×2): qty 1

## 2018-11-28 MED ORDER — SENNOSIDES-DOCUSATE SODIUM 8.6-50 MG PO TABS
2.0000 | ORAL_TABLET | ORAL | Status: DC
  Filled 2018-11-28: qty 2

## 2018-11-28 MED ORDER — LIDOCAINE HCL (PF) 1 % IJ SOLN
30.0000 mL | INTRAMUSCULAR | Status: AC | PRN
  Administered 2018-11-28: 30 mL via SUBCUTANEOUS

## 2018-11-28 MED ORDER — PRENATAL MULTIVITAMIN CH
1.0000 | ORAL_TABLET | Freq: Every day | ORAL | Status: DC
  Administered 2018-11-29: 1 via ORAL
  Filled 2018-11-28: qty 1

## 2018-11-28 MED ORDER — ONDANSETRON HCL 4 MG PO TABS: 4.0000 mg | ORAL_TABLET | ORAL | Status: DC | PRN

## 2018-11-28 MED ORDER — MISOPROSTOL 200 MCG PO TABS
ORAL_TABLET | ORAL | Status: AC
  Filled 2018-11-28: qty 4

## 2018-11-28 MED ORDER — SOD CITRATE-CITRIC ACID 500-334 MG/5ML PO SOLN: 30.0000 mL | ORAL | Status: DC | PRN

## 2018-11-28 MED ORDER — LACTATED RINGERS IV SOLN: 500.0000 mL | INTRAVENOUS | Status: DC | PRN

## 2018-11-28 MED ORDER — IBUPROFEN 600 MG PO TABS
600.0000 mg | ORAL_TABLET | Freq: Four times a day (QID) | ORAL | Status: DC
  Administered 2018-11-28 – 2018-11-29 (×2): 600 mg via ORAL
  Filled 2018-11-28 (×4): qty 1

## 2018-11-28 MED ORDER — OXYTOCIN 10 UNIT/ML IJ SOLN
INTRAMUSCULAR | Status: AC
  Filled 2018-11-28: qty 2

## 2018-11-28 MED ORDER — LIDOCAINE HCL (PF) 1 % IJ SOLN
INTRAMUSCULAR | Status: AC
  Administered 2018-11-28: 30 mL via SUBCUTANEOUS
  Filled 2018-11-28: qty 30

## 2018-11-28 MED ORDER — ZOLPIDEM TARTRATE 5 MG PO TABS: 5.0000 mg | ORAL_TABLET | Freq: Every evening | ORAL | Status: DC | PRN

## 2018-11-28 MED ORDER — FERROUS SULFATE 325 (65 FE) MG PO TABS
325.0000 mg | ORAL_TABLET | Freq: Two times a day (BID) | ORAL | Status: DC
  Administered 2018-11-29 (×2): 325 mg via ORAL
  Filled 2018-11-28 (×2): qty 1

## 2018-11-28 MED ORDER — ONDANSETRON HCL 4 MG/2ML IJ SOLN: 4.0000 mg | Freq: Four times a day (QID) | INTRAMUSCULAR | Status: DC | PRN

## 2018-11-28 MED ORDER — MEASLES, MUMPS & RUBELLA VAC IJ SOLR
0.5000 mL | Freq: Once | INTRAMUSCULAR | Status: DC
  Filled 2018-11-28: qty 0.5

## 2018-11-28 MED ORDER — OXYTOCIN 40 UNITS IN NORMAL SALINE INFUSION - SIMPLE MED
2.5000 [IU]/h | INTRAVENOUS | Status: DC
  Administered 2018-11-28: 2.5 [IU]/h via INTRAVENOUS

## 2018-11-28 MED ORDER — OXYTOCIN BOLUS FROM INFUSION
500.0000 mL | Freq: Once | INTRAVENOUS | Status: AC
  Administered 2018-11-28: 21:00:00 500 mL via INTRAVENOUS

## 2018-11-28 MED ORDER — IBUPROFEN 600 MG PO TABS
ORAL_TABLET | ORAL | Status: AC
  Filled 2018-11-28: qty 1

## 2018-11-28 MED ORDER — AMMONIA AROMATIC IN INHA
RESPIRATORY_TRACT | Status: AC
  Filled 2018-11-28: qty 10

## 2018-11-28 MED ORDER — ONDANSETRON HCL 4 MG/2ML IJ SOLN: 4.0000 mg | INTRAMUSCULAR | Status: DC | PRN

## 2018-11-28 NOTE — H&P (Addendum)
OB History & Physical   History of Present Illness:  Chief Complaint: Labor- UCs since last night  HPI:  Amber Price is a 38 y.o. [id number] female at [redacted]w[redacted]d dated by LMP and c/w [place] at [redacted]w[redacted]d.  She presents to L&D for painful contractions every 2 min. Reports active FM and bloody show. SROM large amt clear fluid during RN eval and SVE on arrival at 2030.   Pregnancy Issues: 1. Rubella Non-immune 2. Obesity, BMI 35.4 pre-preg, normal baseline labs 3. Advanced maternal age 66. Hx Fibroids 5. HSV positive, no recent outbreaks, on suppression at 35wks 6. Transferred care to [facility] after 3 visits at [facility]  7. Sickle cell trait carrier 8. Anemia, on iron supplement   Maternal Medical History:   Past Medical History:  Diagnosis Date  . Bacterial vaginosis   . Cervical dysplasia 2002   [place]  . Herpes   . History of Papanicolaou smear of cervix 01/09/2015   -/-  . 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 Pre-diabetes 02/2016   DIET, EXERCISE, WT LOSS. RECK 2018 ANNUAL  . Sickle cell trait ([place])   . Vitamin D deficiency 01/2016   ADD VIT D3 5000 IU DAILY    Past Surgical History:  Procedure Laterality Date  . NO PAST SURGERIES      No Known Allergies  Prior to Admission medications   Medication Sig Start Date End Date Taking? Authorizing Provider  aspirin 81 MG chewable tablet Chew 81 mg by mouth daily.    [provider]  folic acid (FOLVITE) 1 MG tablet Take 1 mg by mouth daily.    [provider]  Prenatal Vit-Fe Fumarate-FA (PRENATAL MULTIVITAMIN) TABS tablet Take 1 tablet by mouth daily at 12 noon.    [provider]  valACYclovir (VALTREX) 500 MG tablet Take 1 tablet (500 mg total) by mouth 2 (two) times daily. 12/10/17   Rod Can, CNM     Prenatal care site: [facility] [facility]   Social History: She  reports that she has never smoked. She has never used smokeless tobacco. She reports previous alcohol use. She  reports that she does not use drugs.  Family History: family history includes Cancer (age of onset: 29) in her paternal grandmother; Diabetes Mellitus II in her mother; Hypercholesterolemia in her father; Hypertension in her mother.   Review of Systems: A full review of systems was performed and negative except as noted in the HPI.     Physical Exam:  Vital Signs: BP 136/84   Pulse (!) 111   LMP 03/05/2018  General: no acute distress.  HEENT: normocephalic, atraumatic  Heart: regular rate & rhythm.  No murmurs/rubs/gallops Lungs: clear to auscultation bilaterally, normal respiratory effort Abdomen: soft, gravid, non-tender;  EFW: 7lbs Pelvic:   External: Normal external female genitalia, no lesions noted on external exam and outer 1/3 of vagina- unable to complete speculum exam due to advanced dilation and fetal station.   Cervix: Dilation: Lip/rim / Effacement (%): 100 / Station: -1    Extremities: non-tender, symmetric, no edema bilaterally.  DTRs: 2+  Neurologic: Alert & oriented x 3.    Results for orders placed or performed during the hospital encounter of 11/28/18 (from the past 24 hour(s))  CBC     Status: Abnormal   Collection Time: 11/28/18  8:42 PM  Result Value Ref Range   WBC 12.3 (H) 4.0 - 10.5 K/uL   RBC 4.32  3.87 - 5.11 MIL/uL   Hemoglobin 11.6 (L) 12.0 - 15.0 g/dL   HCT 35.0 (L) 36.0 - 46.0 %   MCV 81.0 80.0 - 100.0 fL   MCH 26.9 26.0 - 34.0 pg   MCHC 33.1 30.0 - 36.0 g/dL   RDW 14.2 11.5 - 15.5 %   Platelets 200 150 - 400 K/uL   nRBC 0.0 0.0 - 0.2 %  Type and screen [place]     Status: None (Preliminary result)   Collection Time: 11/28/18  8:42 PM  Result Value Ref Range   ABO/RH(D) PENDING    Antibody Screen PENDING    Sample Expiration      12/01/2018,2359 Performed at [facility], [address] [place], [place] [address]   Type and screen     Status: None (Preliminary result)   Collection Time: 11/28/18  9:08 PM   Result Value Ref Range   ABO/RH(D) PENDING    Antibody Screen PENDING    Sample Expiration      12/01/2018,2359 Performed at [facility], [address] [place], [place] [address]     Pertinent Results:  Prenatal Labs: Blood type/Rh  O pos  Antibody screen neg  Rubella  NON-Immune  Varicella Immune  RPR NR  HBsAg Neg  HIV NR  GC neg  Chlamydia neg  Genetic screening negative  1 hour GTT  early 1h 92; 28wks: 118  GBS  negative   FHT: 130bpm,  Mod variability, no accels, early decels TOCO: q24min SVE:   Initial exam by RN: 8cm and SROM clear fluid   Cephalic by leopolds  No results found.  Assessment:  Amber Price is a 38 y.o. G34P2002 female at [redacted]w[redacted]d with active labor.   Plan:  1. Admit to Labor & Delivery; consents reviewed and obtained - pt admitted in active labor and progressed rapidly to second stage and delivery- see delivery note.   2. Fetal Well being  - Fetal Tracing: Cat I  - Group B Streptococcus ppx indicated: neg - Presentation: cephalic confirmed by exam   3. Routine OB: - Prenatal labs reviewed, as above - Rh O Pos - CBC, T&S, RPR on admit - Clear fluids, IVF  4. Monitoring of Labor -  Contractions: external toco in place -  Pelvis proven to 7lbs -  Plan for continuous fetal monitoring  - Anticipate vaginal delivery    Francetta Found, CNM 11/28/18 9:35 PM

## 2018-11-28 NOTE — Discharge Summary (Signed)
Obstetrical Discharge Summary  Patient Name: Amber Price DOB: 08-10-1980 MRN: [id number]  Date of Admission: 11/28/2018 Date of Delivery: 11/28/18 Delivered by: Hassan Buckler CNM Date of Discharge: 11/29/2018  Primary OB: [facility]  FTD:[id number] last menstrual period was 03/05/2018. EDC Estimated Date of Delivery: 12/10/18 Gestational Age at Delivery: [redacted]w[redacted]d   Antepartum complications:  1. Rubella Non-immune 2. Obesity, BMI 35.4 pre-preg, normal baseline labs 3. Advanced maternal age 38. Hx Fibroids 5. HSV positive, no recent outbreaks, on suppression at 35wks 6. Transferred care to [facility] after 3 visits at [facility]  7. Sickle cell trait carrier 8. Anemia, on iron supplement  Admitting Diagnosis: active labor at 38.2wks Secondary Diagnosis: SVD, repair of 1st deg lac  Patient Active Problem List   Diagnosis Date Noted  . Labor and delivery, indication for care 11/28/2018  . Advanced maternal age in multigravida, first trimester   . Fibroid 12/15/2017  . History of herpes genitalis 07/03/2017  . BMI 35.0-35.9,adult 07/03/2017    Augmentation: none Complications: None Intrapartum complications/course: pt presented in spontaneous active labor, pushed effectively to SVD of viable female. EBL; 279ml Date of Delivery: 11/28/18 Delivered By: Hassan Buckler CNM Delivery Type: spontaneous vaginal delivery Anesthesia: local Placenta: spontaneous Laceration: 1st deg perineal laceration Episiotomy: none Newborn Data: Live born female "Glennon Mac" Birth Weight:   APGAR: 74, 9  Newborn Delivery   Birth date/time:  11/28/2018 20:58:00 Delivery type:  Vaginal, Spontaneous      Postpartum Procedures: none  Post partum course:  Patient had an uncomplicated postpartum course.  By time of discharge on PPD#1, her pain was controlled on oral pain medications; she had appropriate lochia and was ambulating, voiding without difficulty and tolerating regular diet.  She and baby were  deemed stable for discharge to home at 24hrs after birth, and patient requested discharge at that time.     Discharge Physical Exam:  BP 124/84 (BP Location: Right Arm)   Pulse 85   Temp 98.4 F (36.9 C) (Oral)   Resp 18   Ht 5\' 9"  (1.753 m)   Wt 111.6 kg   LMP 03/05/2018   SpO2 97%   Breastfeeding Unknown   BMI 36.33 kg/m   General: NAD CV: RRR Pulm: CTABL, nl effort ABD: s/nd/nt, fundus firm and below the umbilicus Lochia: moderate DVT Evaluation: LE non-ttp, no evidence of DVT on exam.  Hemoglobin  Date Value Ref Range Status  11/29/2018 10.7 (L) 12.0 - 15.0 g/dL Final  07/03/2017 11.0 (L) 11.1 - 15.9 g/dL Final   HCT  Date Value Ref Range Status  11/29/2018 32.3 (L) 36.0 - 46.0 % Final   Hematocrit  Date Value Ref Range Status  07/03/2017 34.2 34.0 - 46.6 % Final     Disposition: stable, discharge to home. Baby Feeding: formula Baby Disposition: home with mom  Rh Immune globulin given: n/a Rubella vaccine given: needs prior to DC - declined Varicella vaccine given: n/a, immune Tdap vaccine given in AP  setting: 09/14/18 Flu vaccine given in AP or PP setting: declined  Contraception: OCPs vs partner vasectomy  Prenatal Labs:  Blood type/Rh  O pos  Antibody screen neg  Rubella  NON-Immune  Varicella Immune  RPR NR  HBsAg Neg  HIV NR  GC neg  Chlamydia neg  Genetic screening negative  1 hour GTT  early 1h 92; 28wks: 118  GBS  negative      Plan:  Fredirick Maudlin was discharged to home in good condition. Follow-up  appointment with delivering provider in 5 weeks.  Discharge Medications: Allergies as of 11/29/2018   No Known Allergies     Medication List    STOP taking these medications   aspirin 81 MG chewable tablet   folic acid 1 MG tablet Commonly known as:  FOLVITE   valACYclovir 500 MG tablet Commonly known as:  VALTREX     TAKE these medications   prenatal multivitamin Tabs tablet Take 1 tablet by mouth daily at 12 noon.        Follow-up Information    McVey, Murray Hodgkins, CNM Follow up in 5 week(s).   Specialty:  Obstetrics and Gynecology Contact information: [address] [place] [address] [phone]           Signed:  Honor Loh Ward, MD 11/29/2018  11:12 PM

## 2018-11-29 LAB — CBC
HCT: 32.3 % — ABNORMAL LOW (ref 36.0–46.0)
Hemoglobin: 10.7 g/dL — ABNORMAL LOW (ref 12.0–15.0)
MCH: 26.5 pg (ref 26.0–34.0)
MCHC: 33.1 g/dL (ref 30.0–36.0)
MCV: 80 fL (ref 80.0–100.0)
Platelets: 222 10*3/uL (ref 150–400)
RBC: 4.04 MIL/uL (ref 3.87–5.11)
RDW: 14.2 % (ref 11.5–15.5)
WBC: 16.3 10*3/uL — ABNORMAL HIGH (ref 4.0–10.5)
nRBC: 0 % (ref 0.0–0.2)

## 2018-11-29 LAB — SARS CORONAVIRUS 2 BY RT PCR (HOSPITAL ORDER, PERFORMED IN ~~LOC~~ HOSPITAL LAB): SARS Coronavirus 2: NEGATIVE

## 2018-11-29 NOTE — Progress Notes (Signed)
Post Partum Day 1  Subjective: Doing well, no concerns. Ambulating without difficulty, pain managed with PO meds, tolerating regular diet, and voiding without difficulty.   No fever/chills, chest pain, shortness of breath, nausea/vomiting, or leg pain. No nipple or breast pain.  Objective: BP 109/69 (BP Location: Right Arm)   Pulse 75   Temp 98.3 F (36.8 C) (Oral)   Resp 16   Ht '5\' 9"'  (1.753 m)   Wt 111.6 kg   LMP 03/05/2018   SpO2 98%   Breastfeeding Unknown   BMI 36.33 kg/m    Physical Exam:  General: alert, cooperative, appears stated age and no distress Breasts: soft/nontender CV: RRR Pulm: nl effort, CTABL Abdomen: soft, non-tender, active bowel sounds Uterine Fundus: firm Lochia: appropriate DVT Evaluation: No evidence of DVT seen on physical exam. No cords or calf tenderness. No significant calf/ankle edema.  Recent Labs    11/28/18 2042 11/29/18 0555  HGB 11.6* 10.7*  HCT 35.0* 32.3*  WBC 12.3* 16.3*  PLT 200 222    Assessment/Plan: 38 y.o. G5P3003 postpartum day # 1  -Continue routine postpartum care -Encouraged snug fitting bra, cold application, Tylenol PRN, and cabbage leaves for engorgement for formula feeding  -Immunization status: Needs MMR prior to discharge -Desires discharge home at 24 hours if baby is cleared for discharge  Disposition: Continue inpatient postpartum care, assess for discharge at 24h PP    LOS: 1 day   Lisette Grinder, CNM 11/29/2018, 9:25 AM   ----- Lisette Grinder Certified Nurse Midwife [facility]

## 2018-11-29 NOTE — Lactation Note (Signed)
This note was copied from a baby's chart. Lactation Consultation Note  Patient Name: Amber Price [id number] Date: 11/29/2018   Mom's initial choice after delivery was to breast feed and bottlefeed. She breast fed twice and after midnight only gave bottles.  When spoke with mom, she reports only wanting to bottlefeed formula now. Mom reports having lots of trouble getting him to latch and that they even tried a nipple shield without success.  Mom has large, flat nipples and only got a drop when tried to hand express.  Explained that it was normal the first 12 to 18 hours to struggle sometimes with latching while baby was learning how to breast feed and for mom to get comfortable.  Discussed alternative of pumping if she chose to do so and could get pump through [employer].  Lactation name and number written on white board and encouraged mom to call with any questions or if she changed her mind and wanted to try baby at the breast again or if wanted to pump.  Maternal Data    Feeding Feeding Type: Bottle Fed - Formula  LATCH Score                   Interventions    Lactation Tools Discussed/Used     Consult Status      Jarold Motto 11/29/2018, 8:48 PM

## 2018-11-29 NOTE — Discharge Instructions (Signed)
After Your Delivery Discharge Instructions   Postpartum: Care Instructions  After childbirth (postpartum period), your body goes through many changes. Some of these changes happen over several weeks. In the hours after delivery, your body will begin to recover from childbirth while it prepares to breastfeed your newborn. You may feel emotional during this time. Your hormones can shift your mood without warning for no clear reason.  In the first couple of weeks after childbirth, many women have emotions that change from happy to sad. You may find it hard to sleep. You may cry a lot. This is called the "baby blues." These overwhelming emotions often go away within a couple of days or weeks. But it's important to discuss your feelings with your doctor.  You should call your care provider if you have unrelieved feelings of:  Inability to cope  Sadness  Anxiety  Lack of interest in baby  Insomnia  Crying  It is easy to get too tired and overwhelmed during the first weeks after childbirth. Don't try to do too much. Get rest whenever you can, accept help from others, and eat well and drink plenty of fluids.  About 4 to 6 weeks after your baby's birth, you will have a follow-up visit with your care provider. This visit is your time to talk to your provider about anything you are concerned or curious about.  Follow-up care is a key part of your treatment and safety. Be sure to make and go to all appointments, and call your doctor if you are having problems. It's also a good idea to know your test results and keep a list of the medicines you take.  How can you care for yourself at home?  Sleep or rest when your baby sleeps.  Get help with household chores from family or friends, if you can. Do not try to do it all yourself.  If you have hemorrhoids or swelling or pain around the opening of your vagina, try using cold and heat. You can put ice or a cold pack on the area for 10 to 20 minutes at  a time. Put a thin cloth between the ice and your skin. Also try sitting in a few inches of warm water (sitz bath) 3 times a day and after bowel movements.  Take pain medicines exactly as directed.  If the provider gave you a prescription medicine for pain, take it as prescribed.  If you do not have a prescription and need something over the counter, you can take:  Ibuprofen (Motrin, Advil) up to '600mg'$  every 6 hours as needed for pain  Acetaminophen (Tylenol) up to '650mg'$  every 4 hours as needed for pain  Some people find it helpful to alternate between these two medications.   No driving for 1-2 weeks or while taking pain medications.   Eat more fiber to avoid constipation. Include foods such as whole-grain breads and cereals, raw vegetables, raw and dried fruits, and beans.  Drink plenty of fluids, enough so that your urine is light yellow or clear like water. If you have kidney, heart, or liver disease and have to limit fluids, talk with your doctor before you increase the amount of fluids you drink.  Do not put anything in the vagina for 6 weeks. This means no sex, no tampons, no douching, and no enemas.  If you have stitches, keep the area clean by pouring or spraying warm water over the area outside your vagina and anus after you use the toilet.  No strenuous activity or heavy lifting for 6 weeks   No tub baths; showers only  Continue prenatal vitamin and iron.  If breastfeeding:  Increase calories and fluids while breastfeeding.  You may have a slight fever when your milk comes in, but it should go away on its own. If it does not, and rises above 101.0 please call the doctor.  For breastfeeding concerns, the lactation consultant can be reached at [phone].  For concerns about your baby, please call your pediatrician.   Keep a list of questions to bring to your postpartum visit. Your questions might be about:  Changes in your breasts, such as lumps or  soreness.  When to expect your menstrual period to start again.  What form of birth control is best for you.  Weight you have put on during the pregnancy.  Exercise options.  What foods and drinks are best for you, especially if you are breastfeeding.  Problems you might be having with breastfeeding.  When you can have sex. Some women may want to talk about lubricants for the vagina.  Any feelings of sadness or restlessness that you are having.   When should you call for help?  Call 911 anytime you think you may need emergency care. For example, call if:  You have thoughts of harming yourself, your baby, or another person.  You passed out (lost consciousness).  Call the office at [phone] or seek immediate medical care if:  If you have heavy bleeding such that you are soaking 1 pad in an hour for 2 hours  You are dizzy or lightheaded, or you feel like you may faint.  You have a fever; a temperature of 101.0 F or greater  Chills  Difficulty urinating  Headache unrelieved by "pain meds"   Visual changes  Pain in the right side of your belly near your ribs  Breasts reddened, hard, hot to the touch or any other breast concerns  Nipple discharge which is foul-smelling or contains pus   New pain unrelieved with recommended over-the-counter dosages  Difficulty breathing with or without chest pain   New leg pain, swelling, or redness, especially if it is only on one leg  Any other concerns  Watch closely for changes in your health, and be sure to contact your provider if:  You have new or worse vaginal discharge.  You feel sad or depressed.  You are having problems with your breasts or breastfeeding.    

## 2018-11-30 LAB — RPR: RPR Ser Ql: NONREACTIVE

## 2018-11-30 NOTE — Progress Notes (Signed)
Patient discharged home with infant. Discharge instructions reviewed and given to pt. Pt verbalized understanding. Escorted out by staff.

## 2019-01-05 DIAGNOSIS — O99013 Anemia complicating pregnancy, third trimester: Secondary | ICD-10-CM

## 2019-01-05 DIAGNOSIS — Z283 Underimmunization status: Secondary | ICD-10-CM | POA: Insufficient documentation

## 2019-01-05 DIAGNOSIS — Z2839 Other underimmunization status: Secondary | ICD-10-CM | POA: Insufficient documentation

## 2019-01-05 DIAGNOSIS — O09899 Supervision of other high risk pregnancies, unspecified trimester: Secondary | ICD-10-CM

## 2019-01-12 ENCOUNTER — Ambulatory Visit: Payer: Self-pay | Admitting: Nurse Practitioner

## 2019-01-12 ENCOUNTER — Other Ambulatory Visit: Payer: Self-pay

## 2019-01-12 DIAGNOSIS — Z30011 Encounter for initial prescription of contraceptive pills: Secondary | ICD-10-CM

## 2019-01-12 DIAGNOSIS — O99019 Anemia complicating pregnancy, unspecified trimester: Secondary | ICD-10-CM

## 2019-01-12 DIAGNOSIS — Z3009 Encounter for other general counseling and advice on contraception: Secondary | ICD-10-CM

## 2019-01-12 LAB — HEMOGLOBIN, FINGERSTICK: Hemoglobin: 12.5 g/dL (ref 11.1–15.9)

## 2019-01-12 MED ORDER — NORGESTIMATE-ETH ESTRADIOL 0.25-35 MG-MCG PO TABS: 1.0000 | ORAL_TABLET | Freq: Every day | ORAL | 12 refills | Status: DC

## 2019-01-12 NOTE — Progress Notes (Signed)
Patient ID: Amber Price, female   DOB: [birth year], 38 y.o.   MRN: [id number]   Family Planning Visit- Initial Visit  Subjective:  Amber Price is a 38 y.o. being seen today for an initial well woman visit and to discuss family planning options.  She is currently using none and desires to begin OCP (estrogen/progesterone) for pregnancy prevention. Patient reports she does not  or her partner wants a pregnancy in the next year.  Patient has the following medical conditions has History of herpes genitalis; BMI 35.0-35.9,adult; Fibroid; Advanced maternal age in multigravida, first trimester; Labor and delivery, indication for care; Anemia complicating pregnancy in third trimester; and Rubella non-immune status, antepartum on their problem list.  Chief Complaint  Patient presents with  . Postpartum Care    Patient reports - desire to begin OCP's for [facility] Previously took pills in past LMP - 01/05/2019 - normal   Patient denies  - any additional significant questions or concerns at this time   Body mass index is 33.97 kg/m. - Patient is eligible for diabetes screening based on BMI and age >00?  not applicable XF8H ordered? no  Patient reports 1 of partners in last year. Desires STI screening?  No -  - declines at this time  Does the patient have a current or past history of drug use? No   No components found for: HCV]   There are no preventive care reminders to display for this patient.  Review of Systems  Constitutional: Negative.   Respiratory: Negative.   Cardiovascular: Negative.   Skin: Negative.   Psychiatric/Behavioral: Negative.   All other systems reviewed and are negative.   The following portions of the patient's history were reviewed and updated as appropriate: allergies, current medications, past family history, past medical history, past social history, past surgical history and problem list. Problem list updated.   See flowsheet for other program required  questions.  Objective:   Vitals:   01/12/19 0959  BP: 138/76  Weight: 230 lb (104.3 kg)  Height: 5\' 9"  (1.753 m)    Physical Exam Vitals signs and nursing note reviewed.  HENT:     Mouth/Throat:     Pharynx: Oropharynx is clear. Uvula midline.  Eyes:     Extraocular Movements: Extraocular movements intact.     Conjunctiva/sclera: Conjunctivae normal.  Cardiovascular:     Rate and Rhythm: Normal rate and regular rhythm.     Heart sounds: Normal heart sounds.  Pulmonary:     Effort: Pulmonary effort is normal.     Breath sounds: Normal breath sounds.  Genitourinary:    Comments: Declines additional testing at this time Musculoskeletal:     Right lower leg: No edema.     Left lower leg: No edema.  Skin:    General: Skin is warm and dry.  Neurological:     Mental Status: She is alert and oriented to person, place, and time.  Psychiatric:        Mood and Affect: Mood normal.        Behavior: Behavior is cooperative.     Pelvic exam not indicated today due to postpartum visit  Assessment and Plan:  Amber Price is a 38 y.o. female presenting to the [facility] Department for an initial well woman exam/family planning visit  Contraception counseling: Reviewed all forms of birth control options available including abstinence; over the counter/barrier methods; hormonal contraceptive medication including pill, patch, ring, injection,contraceptive implant; hormonal and nonhormonal IUDs; permanent  sterilization options including vasectomy and the various tubal sterilization modalities. Risks and benefits reviewed.  Questions were answered.  Written information was also given to the patient to review.  Patient desires oral contraceptives (estrogen/progesterone) this was prescribed for patient see below.  She will follow up in 1 years for surveillance.  She was told to call with any further questions, or with any concerns about this method of contraception.  Emphasized use  of condoms 100% of the time for STI prevention.   Completed FMLA forms for Return to work  There are no diagnoses linked to this encounter.    No follow-ups on file.  No future appointments.  Berniece Andreas, NP

## 2019-01-12 NOTE — Progress Notes (Signed)
In for PP visit; s/p SVD 11/28/18; bottlefeeding; desires O.C.'s Debera Lat  Pt. Declined MMR; informed script sent to pharmacy for O.C.'s; [facility] consent signed. MVI given; declines condoms. Debera Lat

## 2019-01-18 MED ORDER — ADULT MULTIVITAMIN W/MINERALS CH
1.0000 | ORAL_TABLET | Freq: Every day | ORAL | Status: DC
  Administered 2019-01-18: 1 via ORAL

## 2019-01-28 ENCOUNTER — Encounter: Payer: Self-pay | Admitting: Nurse Practitioner

## 2019-02-14 ENCOUNTER — Other Ambulatory Visit: Payer: Self-pay | Admitting: Obstetrics and Gynecology

## 2019-05-09 ENCOUNTER — Other Ambulatory Visit: Payer: Self-pay

## 2019-05-09 DIAGNOSIS — Z20822 Contact with and (suspected) exposure to covid-19: Secondary | ICD-10-CM

## 2019-05-10 LAB — NOVEL CORONAVIRUS, NAA: SARS-CoV-2, NAA: NOT DETECTED

## 2019-10-07 ENCOUNTER — Telehealth: Payer: Self-pay | Admitting: Physician Assistant

## 2019-10-07 NOTE — Telephone Encounter (Signed)
Call to pharmacy due to fax received requesting alternative Rx.  Fax not clear at area for reason of request.  Call to pharmacy and spoke with Glennon Mac, who states that insurance company requires 90 day supply but Rx only covers 60 day supply.  Verbally gave ok for patient to have 90 day supply with 1 refill to last until time for her annual visit in 01/2020.

## 2020-02-24 ENCOUNTER — Other Ambulatory Visit: Payer: Self-pay | Admitting: Physician Assistant

## 2020-02-24 DIAGNOSIS — Z30011 Encounter for initial prescription of contraceptive pills: Secondary | ICD-10-CM

## 2020-02-24 DIAGNOSIS — Z3009 Encounter for other general counseling and advice on contraception: Secondary | ICD-10-CM

## 2020-02-27 NOTE — Telephone Encounter (Signed)
Per chart review, last PE was 01/12/2019.  Patient needs RP visit prior to any further refills for her OCs.

## 2020-05-12 ENCOUNTER — Other Ambulatory Visit: Payer: Self-pay | Admitting: Physician Assistant

## 2020-05-12 DIAGNOSIS — Z30011 Encounter for initial prescription of contraceptive pills: Secondary | ICD-10-CM

## 2020-05-12 DIAGNOSIS — Z3009 Encounter for other general counseling and advice on contraception: Secondary | ICD-10-CM

## 2020-05-15 NOTE — Telephone Encounter (Signed)
Per chart review, patient with last visit 01/12/2019, when had RP/PP and Rx for OCs sent with refills for 1 year.  Patient will need in-person visit prior to further refills.

## 2020-06-10 IMAGING — US US MFM OB COMPLETE +14 WKS
1 series · 13 of 28 positions shown · non-contrast
Comparison: none

PATIENT INFO:

PERFORMED BY:
                   Sonographer                              MOOSA FATUHEE
SERVICE(S) PROVIDED:
  US MFM OB COMP LESS THAN 14 WEEKS                    76801.4
 ----------------------------------------------------------------------
INDICATIONS:
  12 weeks gestation of pregnancy
FETAL EVALUATION:
 Num Of Fetuses:         1
 Fetal Heart Rate(bpm):  155
 Cardiac Activity:       Present
 Presentation:           Variable
 Placenta:               Anterior
BIOMETRY:
 CRL:      72.1  mm     G. Age:  13w 3d                  EDD:   12/06/18
OB HISTORY:
 Gravidity:    5         Term:   2         SAB:   2
GESTATIONAL AGE:
 LMP:           12w 6d        Date:  03/05/18                 EDD:   12/10/18
 Best:          12w 6d     Det. By:  LMP  (03/05/18)          EDD:   12/10/18
ANATOMY:
 Choroid Plexus:        Within normal limits   Bladder:                Seen
                        for gestational age
 Stomach:               Seen                   Upper Extremities:      Visualized
 Abdominal Wall:        Within normal limits   Lower Extremities:      Visualized
CERVIX UTERUS ADNEXA:
 Left Ovary
 Size(cm)       2.4  x    2     x  2.4       Vol(ml):
 Right Ovary
 Size(cm)        3   x   1.9    x  2.2       Vol(ml):

[Series 1: us mfm ob complete +14 wks · 0.23mm/px · 13 of 33 slices shown]
[im 2/33]
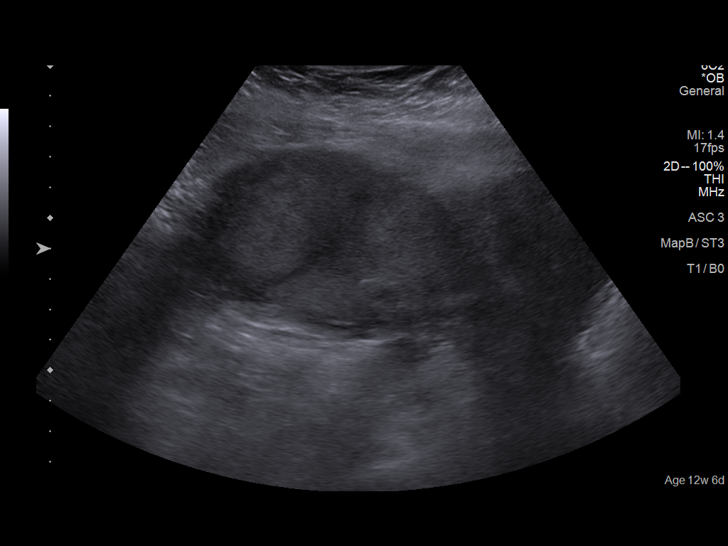
[im 4/33]
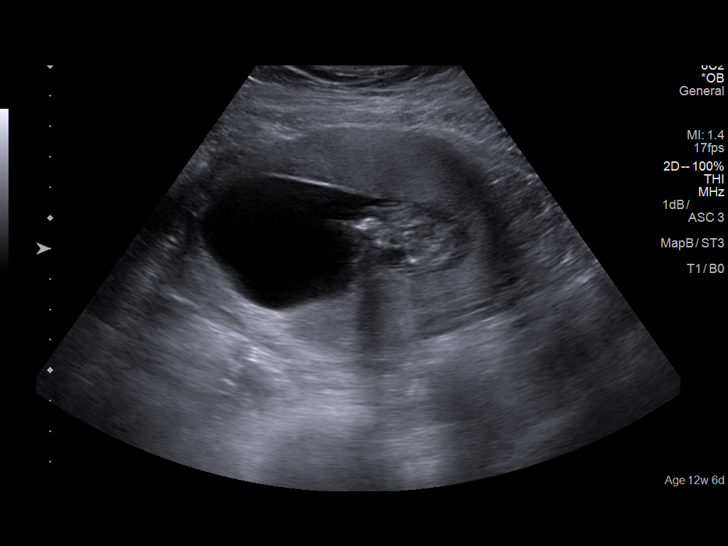
[im 6/33]
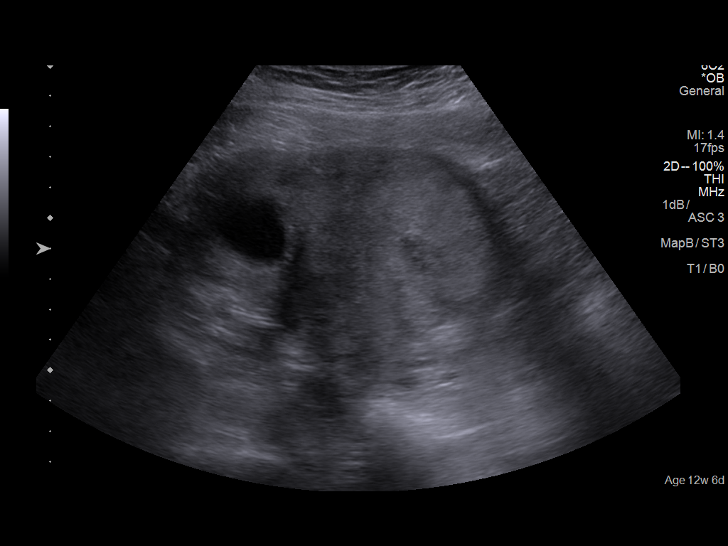
[im 9/33]
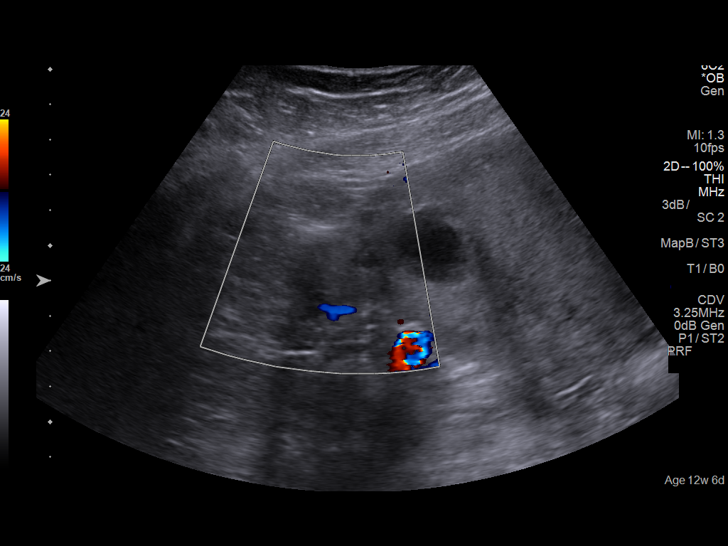
[im 11/33]
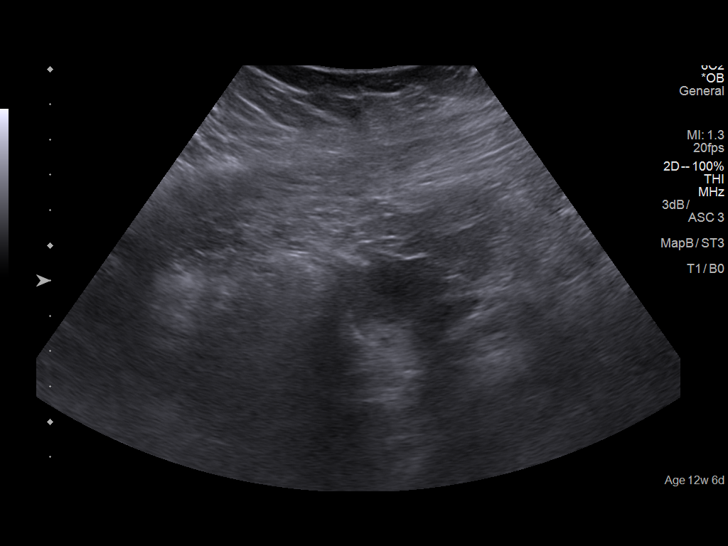
[im 14/33]
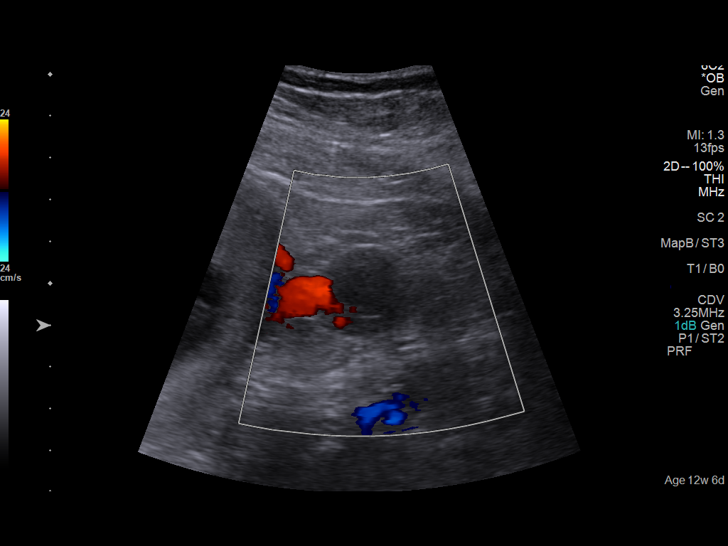
[im 17/33]
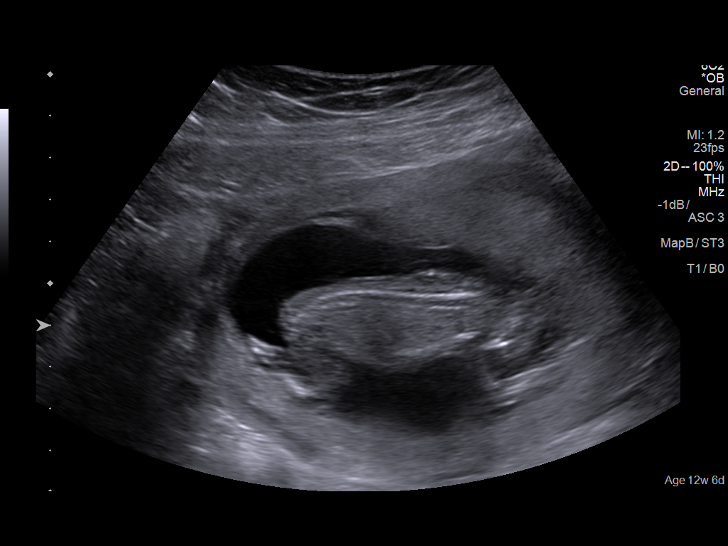
[im 19/33]
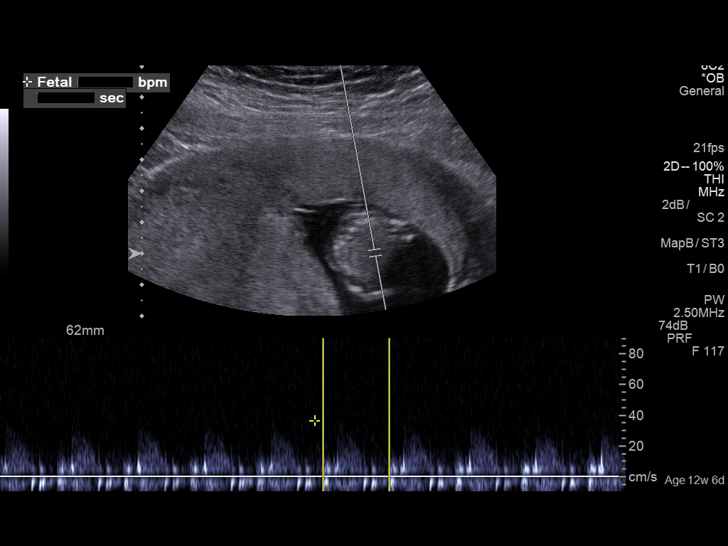
[im 22/33]
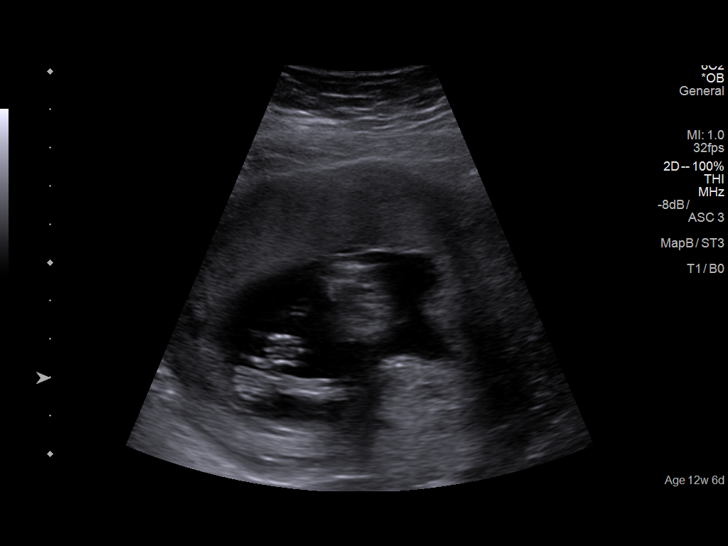
[im 24/33]
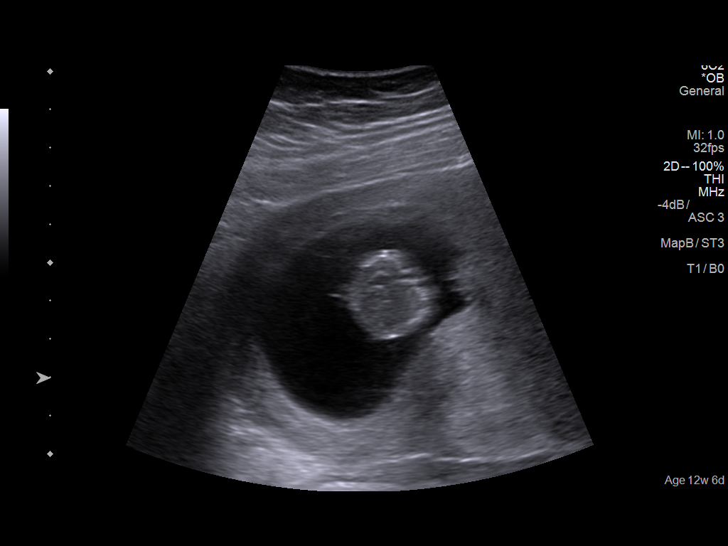
[im 27/33]
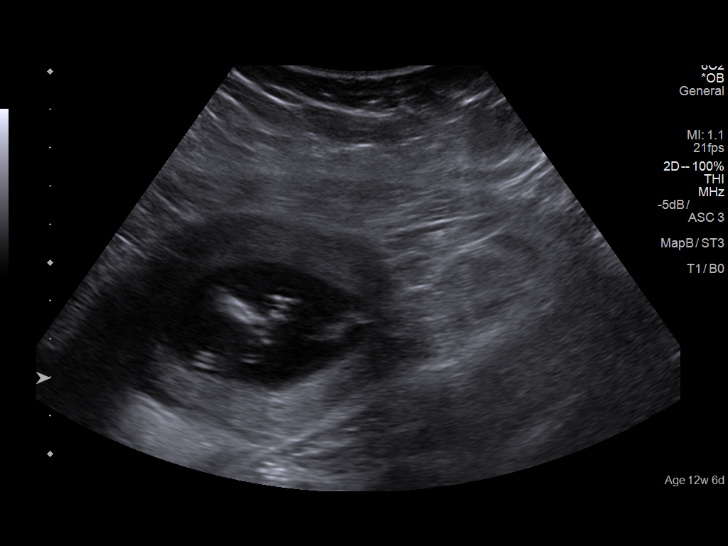
[im 29/33]
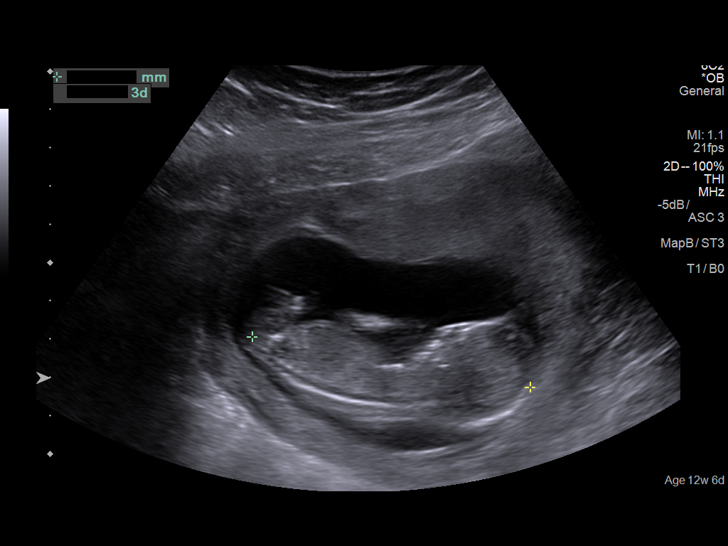
[im 31/33]
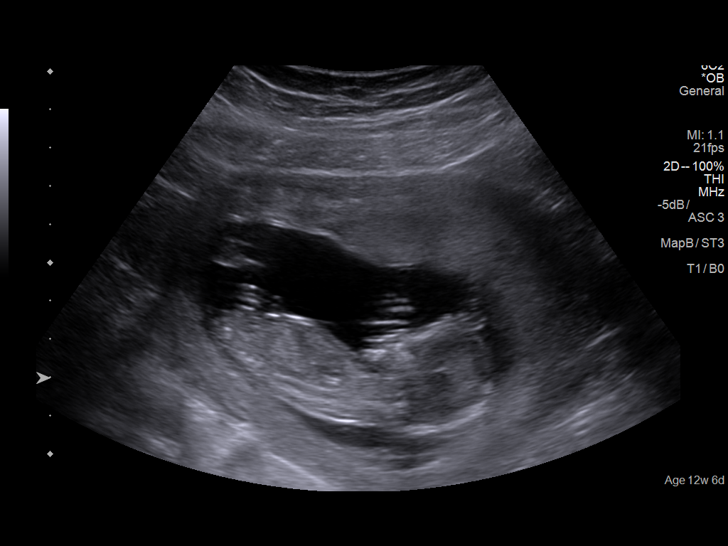

[13 of 28 positions shown; findings below may reference images not displayed]

IMPRESSION: [employer],

 Thank you for referring your patient for genetic counseling
 and  first trimester ultrasound.

 A singleton gestation is noted.

 The fetal biometry correlates with established dating.   The
 GENOVEBA is 12w 6d by LMP confirmed by earliest scan [REDACTED] on 04/29/18 measuring 8w 2d .

 The nuchal translucency area was normal.

 The ovaries were WNL.

 Patient met with the genetic counselor and declined
 additional testing.
 I reocmmended detailed anatomy scan at 18-19 weeks.

## 2020-08-04 IMAGING — US US EXTREM LOW VENOUS
1 series · 13 of 24 positions shown · non-contrast
Comparison: None.

CLINICAL DATA: Bilateral lower extremity pain and edema.



[Series 1: us extrem low venous · 0.08mm/px · 13 of 63 slices shown]
[im 1/63]
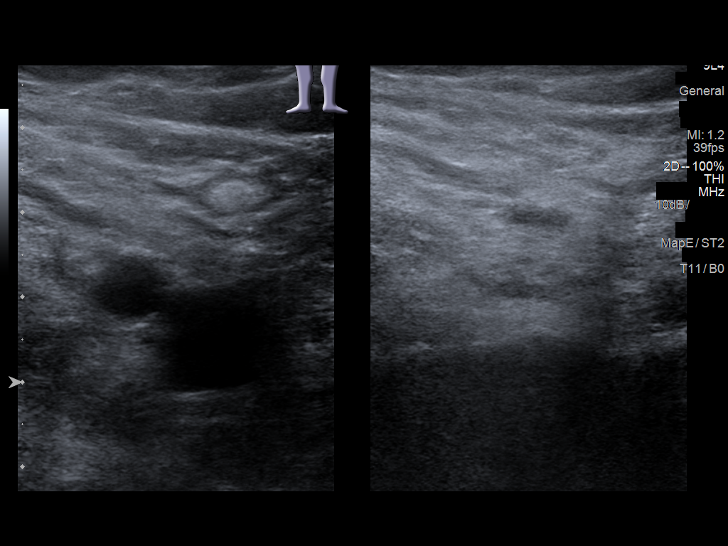
[im 6/63]
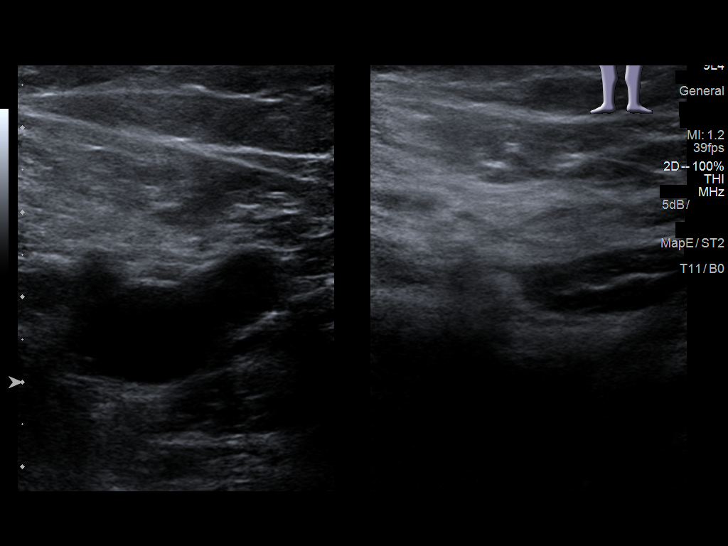
[im 11/63]
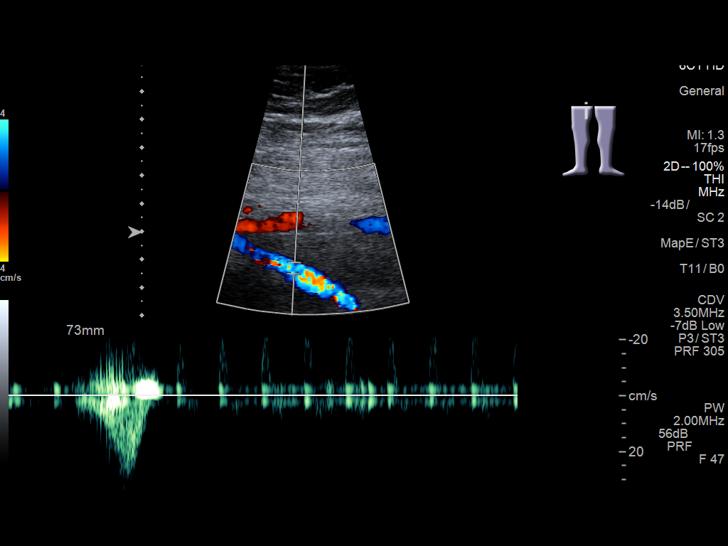
[im 17/63]
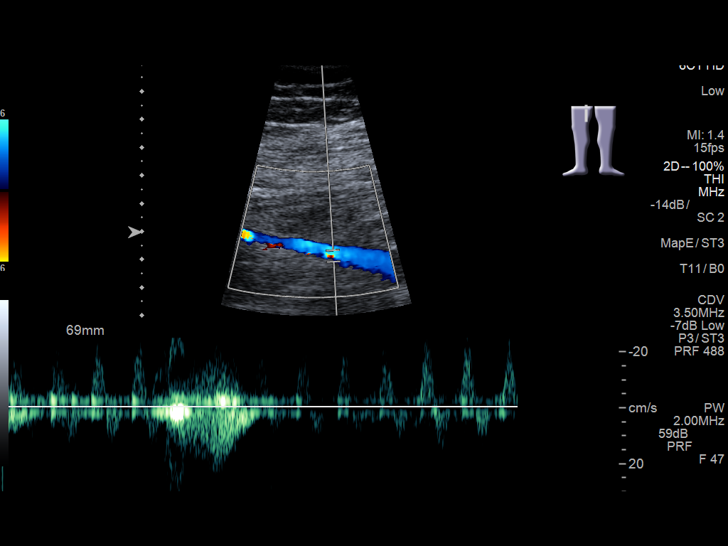
[im 22/63]
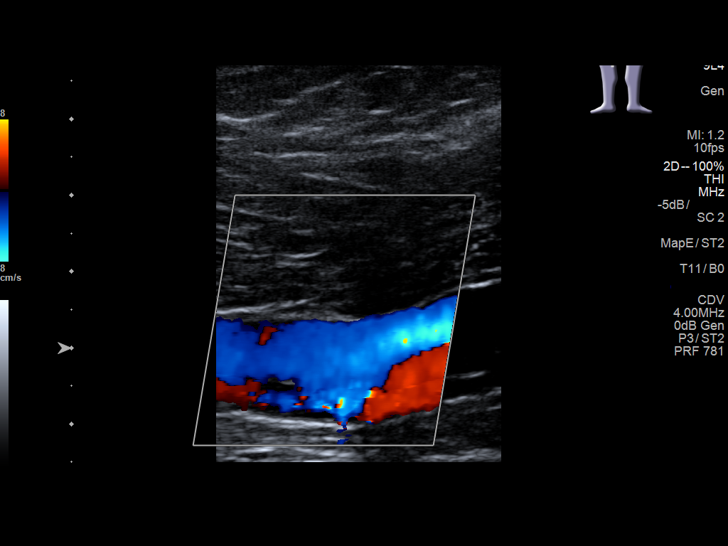
[im 27/63]
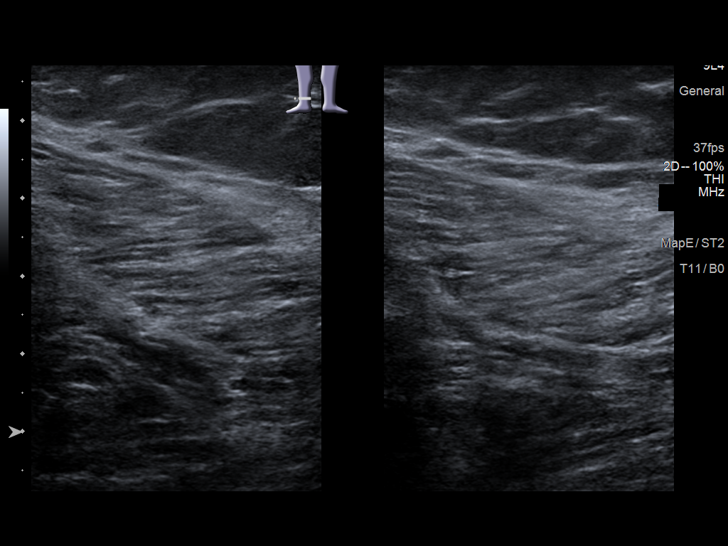
[im 33/63]
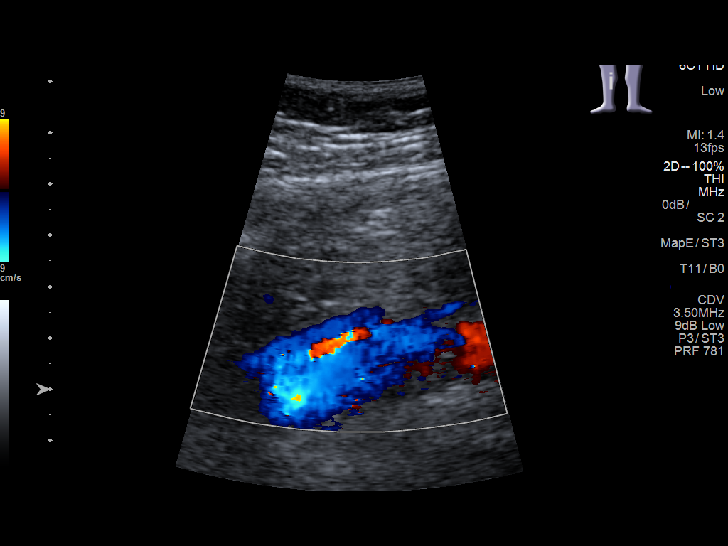
[im 36/63]
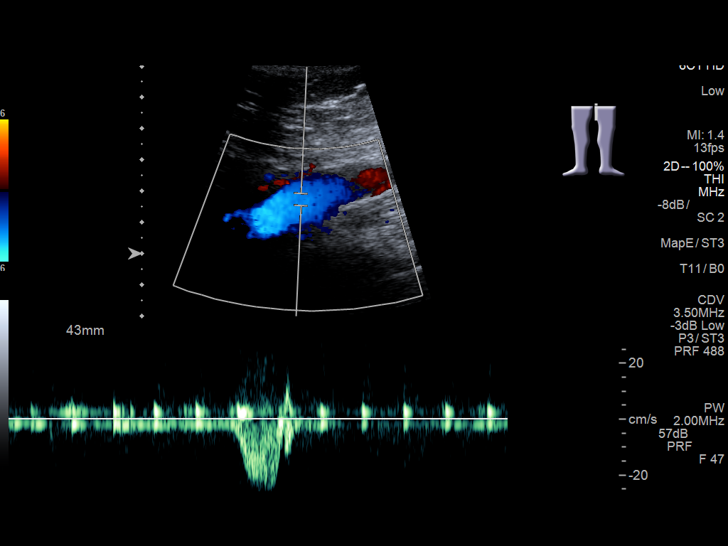
[im 41/63]
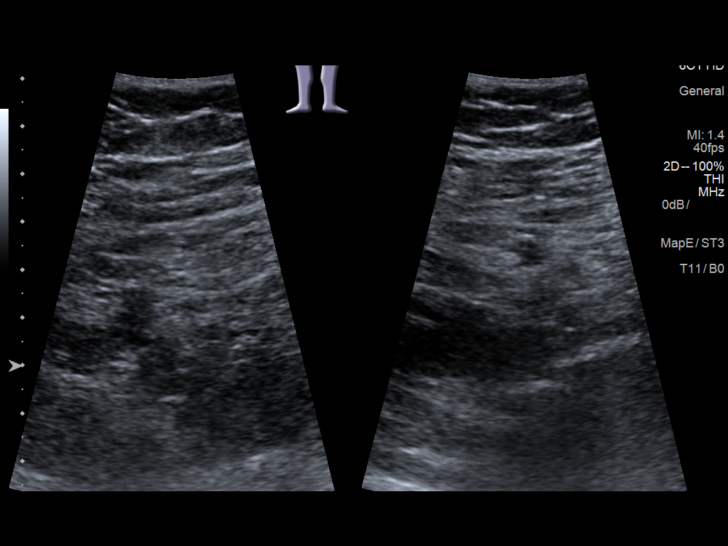
[im 46/63]
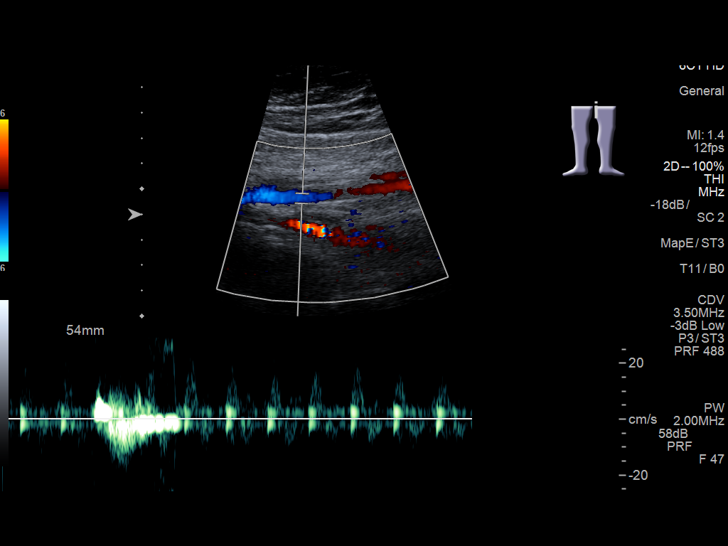
[im 52/63]
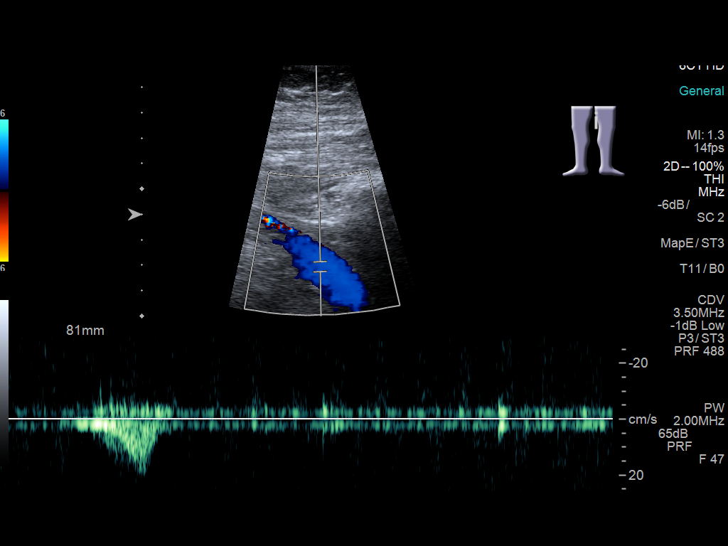
[im 57/63]
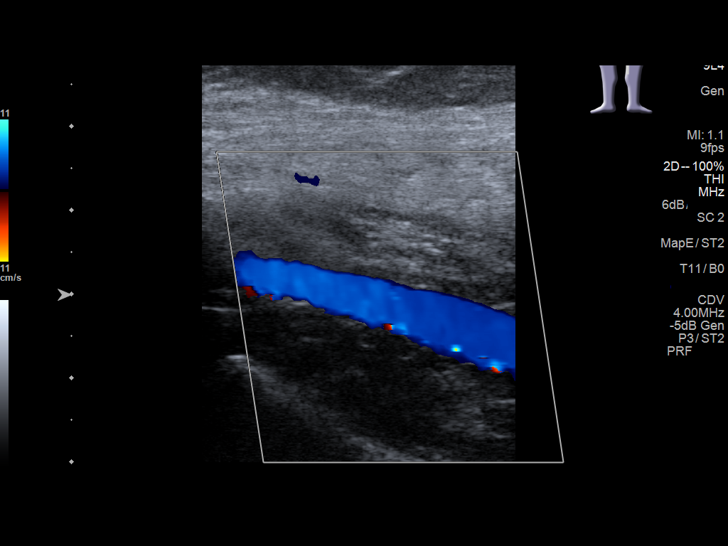
[im 63/63]
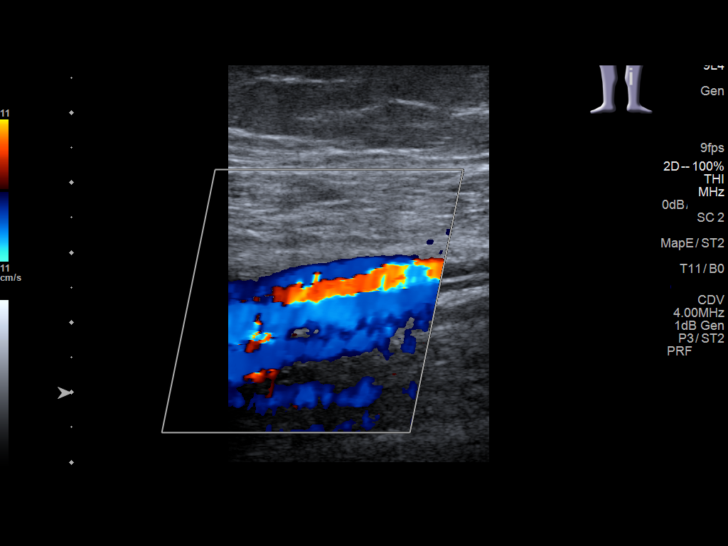

[13 of 24 positions shown; findings below may reference images not displayed]

FINDINGS: RIGHT LOWER EXTREMITY

Common Femoral Vein: No evidence of thrombus. Normal
compressibility, respiratory phasicity and response to augmentation.

Saphenofemoral Junction: No evidence of thrombus. Normal
compressibility and flow on color Doppler imaging.

Profunda Femoral Vein: No evidence of thrombus. Normal
compressibility and flow on color Doppler imaging.

Femoral Vein: No evidence of thrombus. Normal compressibility,
respiratory phasicity and response to augmentation.

Popliteal Vein: No evidence of thrombus. Normal compressibility,
respiratory phasicity and response to augmentation.

Calf Veins: No evidence of thrombus. Normal compressibility and flow
on color Doppler imaging.

Superficial Great Saphenous Vein: No evidence of thrombus. Normal
compressibility.

Venous Reflux:  None.

Other Findings: No evidence of superficial thrombophlebitis or
abnormal fluid collection.

LEFT LOWER EXTREMITY

Common Femoral Vein: No evidence of thrombus. Normal
compressibility, respiratory phasicity and response to augmentation.

Saphenofemoral Junction: No evidence of thrombus. Normal
compressibility and flow on color Doppler imaging.

Profunda Femoral Vein: No evidence of thrombus. Normal
compressibility and flow on color Doppler imaging.

Femoral Vein: No evidence of thrombus. Normal compressibility,
respiratory phasicity and response to augmentation.

Popliteal Vein: No evidence of thrombus. Normal compressibility,
respiratory phasicity and response to augmentation.

Calf Veins: No evidence of thrombus. Normal compressibility and flow
on color Doppler imaging.

Superficial Great Saphenous Vein: No evidence of thrombus. Normal
compressibility.

Venous Reflux:  None.

Other Findings: No evidence of superficial thrombophlebitis or
abnormal fluid collection.
IMPRESSION: No evidence of bilateral lower extremity deep venous thrombosis.

## 2022-05-20 ENCOUNTER — Other Ambulatory Visit: Payer: Self-pay | Admitting: Internal Medicine

## 2022-05-20 DIAGNOSIS — Z1231 Encounter for screening mammogram for malignant neoplasm of breast: Secondary | ICD-10-CM

## 2022-08-19 ENCOUNTER — Other Ambulatory Visit: Payer: Self-pay

## 2022-08-19 ENCOUNTER — Encounter: Payer: Self-pay | Admitting: Internal Medicine

## 2022-08-19 ENCOUNTER — Ambulatory Visit (INDEPENDENT_AMBULATORY_CARE_PROVIDER_SITE_OTHER): Payer: BC Managed Care – PPO | Admitting: Internal Medicine

## 2022-08-19 VITALS — BP 138/82 | HR 97 | Ht 69.0 in | Wt 231.8 lb

## 2022-08-19 DIAGNOSIS — M05711 Rheumatoid arthritis with rheumatoid factor of right shoulder without organ or systems involvement: Secondary | ICD-10-CM | POA: Diagnosis not present

## 2022-08-19 DIAGNOSIS — I10 Essential (primary) hypertension: Secondary | ICD-10-CM | POA: Insufficient documentation

## 2022-08-19 DIAGNOSIS — E782 Mixed hyperlipidemia: Secondary | ICD-10-CM | POA: Diagnosis not present

## 2022-08-19 DIAGNOSIS — M05712 Rheumatoid arthritis with rheumatoid factor of left shoulder without organ or systems involvement: Secondary | ICD-10-CM

## 2022-08-19 DIAGNOSIS — N390 Urinary tract infection, site not specified: Secondary | ICD-10-CM

## 2022-08-19 DIAGNOSIS — E559 Vitamin D deficiency, unspecified: Secondary | ICD-10-CM | POA: Diagnosis not present

## 2022-08-19 LAB — POCT URINALYSIS DIP (CLINITEK)
Bilirubin, UA: NEGATIVE
Blood, UA: NEGATIVE
Glucose, UA: NEGATIVE mg/dL
Ketones, POC UA: NEGATIVE mg/dL
Leukocytes, UA: NEGATIVE
Nitrite, UA: NEGATIVE
POC PROTEIN,UA: 30 — AB
Spec Grav, UA: 1.015 (ref 1.010–1.025)
Urobilinogen, UA: 0.2 E.U./dL
pH, UA: 6 (ref 5.0–8.0)

## 2022-08-19 MED ORDER — AMLODIPINE BESYLATE 5 MG PO TABS: 5.0000 mg | ORAL_TABLET | Freq: Every day | ORAL | 11 refills | Status: DC

## 2022-08-19 MED ORDER — VITAMIN D3 1.25 MG (50000 UT) PO CAPS: 1.0000 | ORAL_CAPSULE | ORAL | 3 refills | Status: DC

## 2022-08-19 NOTE — Progress Notes (Signed)
Established Patient Office Visit  Subjective   Patient ID: Amber Price, female    DOB: [birth year]  Age: 42 y.o. MRN: [id number]  Chief Complaint  Patient presents with   Follow-up    3 month follow up    HPI Patient comes in for her follow up. BP is still on the high side. Now taking Methotrexate and Folic acid for RA treatment. Feels better ,but occasionally needs to take Celebrex.  Will get labs today . And start Amlodipine for BP control.  Review of Systems  Constitutional: Negative.   HENT: Negative.    Eyes: Negative.   Respiratory: Negative.    Cardiovascular: Negative.   Gastrointestinal: Negative.   Genitourinary: Negative.   Musculoskeletal:  Positive for joint pain.  Skin: Negative.   Neurological: Negative.   Endo/Heme/Allergies: Negative.   Psychiatric/Behavioral: Negative.       Objective:     BP 138/82 (BP Location: Right Arm)   Pulse 97   Ht '5\' 9"'$  (1.753 m)   Wt 231 lb 12.8 oz (105.1 kg)   SpO2 95%   BMI 34.23 kg/m    Physical Exam Vitals and nursing note reviewed.  Constitutional:      Appearance: Normal appearance.  HENT:     Head: Normocephalic.     Nose: Nose normal.  Cardiovascular:     Rate and Rhythm: Normal rate and regular rhythm.  Pulmonary:     Effort: Pulmonary effort is normal.  Abdominal:     General: Abdomen is flat. Bowel sounds are normal.     Palpations: Abdomen is soft.  Musculoskeletal:        General: Tenderness present.     Cervical back: Normal range of motion.  Skin:    General: Skin is warm.  Neurological:     General: No focal deficit present.     Mental Status: She is alert.  Psychiatric:        Mood and Affect: Mood normal.        Behavior: Behavior normal.    Results for orders placed or performed in visit on 08/19/22  POCT URINALYSIS DIP (CLINITEK)  Result Value Ref Range   Color, UA yellow yellow   Clarity, UA clear clear   Glucose, UA negative negative mg/dL   Bilirubin, UA negative  negative   Ketones, POC UA negative negative mg/dL   Spec Grav, UA 1.015 1.010 - 1.025   Blood, UA negative negative   pH, UA 6.0 5.0 - 8.0   POC PROTEIN,UA =30 (A) negative, trace   Urobilinogen, UA 0.2 0.2 or 1.0 E.U./dL   Nitrite, UA Negative Negative   Leukocytes, UA Negative Negative      The ASCVD Risk score (Arnett DK, et al., 2019) failed to calculate for the following reasons:   Cannot find a previous HDL lab   Cannot find a previous total cholesterol lab    Assessment & Plan:   Problem List Items Addressed This Visit     Essential hypertension, benign   Relevant Medications   amLODipine (NORVASC) 5 MG tablet   Other Relevant Orders   POCT URINALYSIS DIP (CLINITEK) (Completed)   COMPLETE METABOLIC PANEL WITH GFR   Other Visit Diagnoses     Mixed hyperlipidemia    -  Primary   Relevant Medications   amLODipine (NORVASC) 5 MG tablet   Other Relevant Orders   Lipid Panel w/o Chol/HDL Ratio   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

## 2022-08-28 LAB — VITAMIN D 1,25 DIHYDROXY
Vitamin D 1, 25 (OH)2 Total: 91 pg/mL — ABNORMAL HIGH
Vitamin D2 1, 25 (OH)2: <10 pg/mL
Vitamin D3 1, 25 (OH)2: 82 pg/mL

## 2022-08-28 LAB — LIPID PANEL W/O CHOL/HDL RATIO
Cholesterol, Total: 236 mg/dL — ABNORMAL HIGH (ref 100–199)
HDL: 55 mg/dL (ref >39)
LDL Chol Calc (NIH): 157 mg/dL — ABNORMAL HIGH (ref 0–99)
Triglycerides: 136 mg/dL (ref 0–149)
VLDL Cholesterol Cal: 24 mg/dL (ref 5–40)

## 2022-08-29 ENCOUNTER — Ambulatory Visit
Admission: RE | Admit: 2022-08-29 | Discharge: 2022-08-29 | Disposition: A | Discharge status: Home / Self Care | Payer: BC Managed Care – PPO | Source: Ambulatory Visit | Attending: Internal Medicine | Admitting: Internal Medicine

## 2022-08-29 ENCOUNTER — Encounter: Payer: Self-pay | Admitting: Radiology

## 2022-08-29 DIAGNOSIS — Z1231 Encounter for screening mammogram for malignant neoplasm of breast: Secondary | ICD-10-CM

## 2022-09-02 ENCOUNTER — Other Ambulatory Visit: Payer: Self-pay | Admitting: Internal Medicine

## 2022-09-02 DIAGNOSIS — N6320 Unspecified lump in the left breast, unspecified quadrant: Secondary | ICD-10-CM

## 2022-09-02 DIAGNOSIS — N6489 Other specified disorders of breast: Secondary | ICD-10-CM

## 2022-09-02 DIAGNOSIS — N6310 Unspecified lump in the right breast, unspecified quadrant: Secondary | ICD-10-CM

## 2022-09-02 DIAGNOSIS — R928 Other abnormal and inconclusive findings on diagnostic imaging of breast: Secondary | ICD-10-CM

## 2022-09-09 ENCOUNTER — Ambulatory Visit
Admission: RE | Admit: 2022-09-09 | Discharge: 2022-09-09 | Disposition: A | Discharge status: Home / Self Care | Payer: BC Managed Care – PPO | Source: Ambulatory Visit | Attending: Internal Medicine | Admitting: Internal Medicine

## 2022-09-09 DIAGNOSIS — R928 Other abnormal and inconclusive findings on diagnostic imaging of breast: Secondary | ICD-10-CM

## 2022-09-09 DIAGNOSIS — N6489 Other specified disorders of breast: Secondary | ICD-10-CM | POA: Insufficient documentation

## 2022-11-17 ENCOUNTER — Ambulatory Visit: Payer: BC Managed Care – PPO | Admitting: Internal Medicine

## 2022-11-18 ENCOUNTER — Ambulatory Visit (INDEPENDENT_AMBULATORY_CARE_PROVIDER_SITE_OTHER): Payer: BC Managed Care – PPO | Admitting: Internal Medicine

## 2022-11-18 VITALS — BP 130/68 | HR 84 | Ht 69.0 in | Wt 235.0 lb

## 2022-11-18 DIAGNOSIS — E782 Mixed hyperlipidemia: Secondary | ICD-10-CM | POA: Diagnosis not present

## 2022-11-18 DIAGNOSIS — M05711 Rheumatoid arthritis with rheumatoid factor of right shoulder without organ or systems involvement: Secondary | ICD-10-CM

## 2022-11-18 DIAGNOSIS — I10 Essential (primary) hypertension: Secondary | ICD-10-CM

## 2022-11-18 DIAGNOSIS — E669 Obesity, unspecified: Secondary | ICD-10-CM | POA: Diagnosis not present

## 2022-11-18 DIAGNOSIS — M05712 Rheumatoid arthritis with rheumatoid factor of left shoulder without organ or systems involvement: Secondary | ICD-10-CM

## 2022-11-19 LAB — CBC WITH DIFFERENTIAL
Basophils Absolute: 0 10*3/uL (ref 0.0–0.2)
Basos: 0 %
EOS (ABSOLUTE): 0.1 10*3/uL (ref 0.0–0.4)
Eos: 1 %
Hematocrit: 36.7 % (ref 34.0–46.6)
Hemoglobin: 11.7 g/dL (ref 11.1–15.9)
Immature Grans (Abs): 0 10*3/uL (ref 0.0–0.1)
Immature Granulocytes: 0 %
Lymphocytes Absolute: 2.5 10*3/uL (ref 0.7–3.1)
Lymphs: 27 %
MCH: 26 pg — ABNORMAL LOW (ref 26.6–33.0)
MCHC: 31.9 g/dL (ref 31.5–35.7)
MCV: 82 fL (ref 79–97)
Monocytes Absolute: 0.4 10*3/uL (ref 0.1–0.9)
Monocytes: 5 %
Neutrophils Absolute: 6.3 10*3/uL (ref 1.4–7.0)
Neutrophils: 67 %
RBC: 4.5 x10E6/uL (ref 3.77–5.28)
RDW: 14.3 % (ref 11.7–15.4)
WBC: 9.3 10*3/uL (ref 3.4–10.8)

## 2022-11-19 LAB — CMP14+EGFR
ALT: 9 IU/L (ref 0–32)
AST: 15 IU/L (ref 0–40)
Albumin/Globulin Ratio: 1.4 (ref 1.2–2.2)
Albumin: 4 g/dL (ref 3.9–4.9)
Alkaline Phosphatase: 57 IU/L (ref 44–121)
BUN/Creatinine Ratio: 7 — ABNORMAL LOW (ref 9–23)
BUN: 7 mg/dL (ref 6–24)
Bilirubin Total: 0.4 mg/dL (ref 0.0–1.2)
CO2: 21 mmol/L (ref 20–29)
Calcium: 9.1 mg/dL (ref 8.7–10.2)
Chloride: 101 mmol/L (ref 96–106)
Creatinine, Ser: 0.96 mg/dL (ref 0.57–1.00)
Globulin, Total: 2.8 g/dL (ref 1.5–4.5)
Glucose: 74 mg/dL (ref 70–99)
Potassium: 4.9 mmol/L (ref 3.5–5.2)
Sodium: 138 mmol/L (ref 134–144)
Total Protein: 6.8 g/dL (ref 6.0–8.5)
eGFR: 76 mL/min/{1.73_m2} (ref >59)

## 2022-11-19 LAB — LIPID PANEL W/O CHOL/HDL RATIO
Cholesterol, Total: 197 mg/dL (ref 100–199)
HDL: 54 mg/dL (ref >39)
LDL Chol Calc (NIH): 118 mg/dL — ABNORMAL HIGH (ref 0–99)
Triglycerides: 144 mg/dL (ref 0–149)
VLDL Cholesterol Cal: 25 mg/dL (ref 5–40)

## 2022-11-20 ENCOUNTER — Encounter: Payer: Self-pay | Admitting: Internal Medicine

## 2022-11-20 ENCOUNTER — Other Ambulatory Visit: Payer: Self-pay

## 2022-11-20 DIAGNOSIS — M05711 Rheumatoid arthritis with rheumatoid factor of right shoulder without organ or systems involvement: Secondary | ICD-10-CM | POA: Insufficient documentation

## 2022-11-20 DIAGNOSIS — E782 Mixed hyperlipidemia: Secondary | ICD-10-CM | POA: Insufficient documentation

## 2022-11-20 MED ORDER — ROSUVASTATIN CALCIUM 20 MG PO TABS: 20.0000 mg | ORAL_TABLET | Freq: Every day | ORAL | 3 refills | Status: DC

## 2022-11-20 NOTE — Progress Notes (Signed)
Established Patient Office Visit  Subjective:  Patient ID: Amber Price, female    DOB: 1980-09-25  Age: 43 y.o. MRN: [id number]  Chief Complaint  Patient presents with   Follow-up    3 month follow up    Patient comes in for his follow-up today.  She has gained some weight and her blood pressure was high.  She states that she takes her blood pressure medicines at night.  Her arthritis is under good control.  She is fasting for blood work    No other concerns at this time.   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 RECK 2018 ANNUAL   Sickle cell trait (HCC)    Vitamin D deficiency 01/2016   ADD VIT D3 5000 IU DAILY    Past Surgical History:  Procedure Laterality Date   NO PAST SURGERIES      Social History   Socioeconomic History   Marital status: Single    Spouse name: Not on file   Number of children: 3   Years of education: 14   Highest education level: Not on file  Occupational History   Occupation: BUSINESS OFFICE SPECIALIST  Tobacco Use   Smoking status: Never   Smokeless tobacco: Never  Vaping Use   Vaping Use: Never used  Substance and Sexual Activity   Alcohol use: Not Currently   Drug use: No   Sexual activity: Yes    Birth control/protection: Pill  Other Topics Concern   Not on file  Social History Narrative   Not on file   Social Determinants of Health   Financial Resource Strain: Not on file  Food Insecurity: Not on file  Transportation Needs: Not on file  Physical Activity: Not on file  Stress: Not on file  Social Connections: Not on file  Intimate Partner Violence: Not on file    Family History  Problem Relation Age of Onset   Diabetes Mellitus II Mother    Hypertension Mother     Hypercholesterolemia Father    Cancer Paternal Grandmother 83       LUNG   Sickle cell trait Son     No Known Allergies  Review of Systems  Constitutional:  Negative for chills, fever, malaise/fatigue and weight loss.  HENT:  Negative for congestion, hearing loss, sinus pain and sore throat.   Eyes:  Negative for blurred vision and double vision.  Respiratory:  Negative for cough, shortness of breath and wheezing.   Cardiovascular:  Negative for chest pain, leg swelling and PND.  Gastrointestinal:  Negative for abdominal pain, constipation, diarrhea, heartburn and vomiting.  Genitourinary:  Negative for dysuria, frequency and urgency.  Musculoskeletal:  Negative for back pain, joint pain and neck pain.  Skin: Negative.   Neurological:  Negative for dizziness, weakness and headaches.  Psychiatric/Behavioral:  Negative for depression. The patient is not nervous/anxious and does not have insomnia.        Objective:   BP 130/68   Pulse 84   Ht 5\' 9"  (1.753 m)   Wt 235 lb (106.6 kg)   SpO2 98%   BMI 34.70 kg/m   Vitals:   11/18/22 1306  BP: 130/68  Pulse: 84  Height: 5\' 9"  (1.753 m)  Weight: 235 lb (106.6 kg)  SpO2: 98%  BMI (Calculated): 34.69    Physical Exam Vitals and nursing note reviewed.  Constitutional:      Appearance: She is obese.  Cardiovascular:     Rate and Rhythm: Normal rate and regular rhythm.     Pulses: Normal pulses.     Heart sounds: Normal heart sounds. No murmur heard. Pulmonary:     Effort: Pulmonary effort is normal.     Breath sounds: Normal breath sounds.  Abdominal:     General: Bowel sounds are normal.     Palpations: Abdomen is soft.  Musculoskeletal:        General: No swelling. Normal range of motion.     Cervical back: Normal range of motion and neck supple.     Right lower leg: No edema.     Left lower leg: No edema.  Skin:    General: Skin is warm.  Neurological:     General: No focal deficit present.     Mental Status:  She is alert.  Psychiatric:        Mood and Affect: Mood normal.        Behavior: Behavior normal.      Results for orders placed or performed in visit on 11/18/22  CMP14+EGFR  Result Value Ref Range   Glucose 74 70 - 99 mg/dL   BUN 7 6 - 24 mg/dL   Creatinine, Ser 1.61 0.57 - 1.00 mg/dL   eGFR 76 >09 UE/AVW/0.98   BUN/Creatinine Ratio 7 (L) 9 - 23   Sodium 138 134 - 144 mmol/L   Potassium 4.9 3.5 - 5.2 mmol/L   Chloride 101 96 - 106 mmol/L   CO2 21 20 - 29 mmol/L   Calcium 9.1 8.7 - 10.2 mg/dL   Total Protein 6.8 6.0 - 8.5 g/dL   Albumin 4.0 3.9 - 4.9 g/dL   Globulin, Total 2.8 1.5 - 4.5 g/dL   Albumin/Globulin Ratio 1.4 1.2 - 2.2   Bilirubin Total 0.4 0.0 - 1.2 mg/dL   Alkaline Phosphatase 57 44 - 121 IU/L   AST 15 0 - 40 IU/L   ALT 9 0 - 32 IU/L  Lipid Panel w/o Chol/HDL Ratio  Result Value Ref Range   Cholesterol, Total 197 100 - 199 mg/dL   Triglycerides 119 0 - 149 mg/dL   HDL 54 >14 mg/dL   VLDL Cholesterol Cal 25 5 - 40 mg/dL   LDL Chol Calc (NIH) 782 (H) 0 - 99 mg/dL  CBC With Differential  Result Value Ref Range   WBC 9.3 3.4 - 10.8 x10E3/uL   RBC 4.50 3.77 - 5.28 x10E6/uL   Hemoglobin 11.7 11.1 - 15.9 g/dL   Hematocrit 95.6 21.3 - 46.6 %   MCV 82 79 - 97 fL   MCH 26.0 (L) 26.6 - 33.0 pg   MCHC 31.9 31.5 - 35.7 g/dL   RDW 08.6 57.8 - 46.9 %   Neutrophils 67 Not Estab. %   Lymphs 27 Not Estab. %   Monocytes 5 Not Estab. %   Eos 1 Not Estab. %   Basos 0 Not Estab. %   Neutrophils Absolute 6.3 1.4 - 7.0 x10E3/uL   Lymphocytes Absolute 2.5 0.7 - 3.1 x10E3/uL   Monocytes Absolute 0.4 0.1 - 0.9 x10E3/uL   EOS (ABSOLUTE) 0.1 0.0 - 0.4 x10E3/uL   Basophils Absolute 0.0 0.0 - 0.2 x10E3/uL   Immature  Granulocytes 0 Not Estab. %   Immature Grans (Abs) 0.0 0.0 - 0.1 x10E3/uL    Recent Results (from the past 2160 hour(s))  CMP14+EGFR     Status: Abnormal   Collection Time: 11/18/22  1:23 PM  Result Value Ref Range   Glucose 74 70 - 99 mg/dL   BUN 7 6 - 24  mg/dL   Creatinine, Ser 4.09 0.57 - 1.00 mg/dL   eGFR 76 >81 XB/JYN/8.29   BUN/Creatinine Ratio 7 (L) 9 - 23   Sodium 138 134 - 144 mmol/L   Potassium 4.9 3.5 - 5.2 mmol/L   Chloride 101 96 - 106 mmol/L   CO2 21 20 - 29 mmol/L   Calcium 9.1 8.7 - 10.2 mg/dL   Total Protein 6.8 6.0 - 8.5 g/dL   Albumin 4.0 3.9 - 4.9 g/dL   Globulin, Total 2.8 1.5 - 4.5 g/dL   Albumin/Globulin Ratio 1.4 1.2 - 2.2   Bilirubin Total 0.4 0.0 - 1.2 mg/dL   Alkaline Phosphatase 57 44 - 121 IU/L   AST 15 0 - 40 IU/L   ALT 9 0 - 32 IU/L  Lipid Panel w/o Chol/HDL Ratio     Status: Abnormal   Collection Time: 11/18/22  1:23 PM  Result Value Ref Range   Cholesterol, Total 197 100 - 199 mg/dL   Triglycerides 562 0 - 149 mg/dL   HDL 54 >13 mg/dL   VLDL Cholesterol Cal 25 5 - 40 mg/dL   LDL Chol Calc (NIH) 086 (H) 0 - 99 mg/dL  CBC With Differential     Status: Abnormal   Collection Time: 11/18/22  1:23 PM  Result Value Ref Range   WBC 9.3 3.4 - 10.8 x10E3/uL   RBC 4.50 3.77 - 5.28 x10E6/uL   Hemoglobin 11.7 11.1 - 15.9 g/dL   Hematocrit 57.8 46.9 - 46.6 %   MCV 82 79 - 97 fL   MCH 26.0 (L) 26.6 - 33.0 pg   MCHC 31.9 31.5 - 35.7 g/dL   RDW 62.9 52.8 - 41.3 %   Neutrophils 67 Not Estab. %   Lymphs 27 Not Estab. %   Monocytes 5 Not Estab. %   Eos 1 Not Estab. %   Basos 0 Not Estab. %   Neutrophils Absolute 6.3 1.4 - 7.0 x10E3/uL   Lymphocytes Absolute 2.5 0.7 - 3.1 x10E3/uL   Monocytes Absolute 0.4 0.1 - 0.9 x10E3/uL   EOS (ABSOLUTE) 0.1 0.0 - 0.4 x10E3/uL   Basophils Absolute 0.0 0.0 - 0.2 x10E3/uL   Immature Granulocytes 0 Not Estab. %   Immature Grans (Abs) 0.0 0.0 - 0.1 x10E3/uL      Assessment & Plan:  Patient will get fasting blood work.  She is advised to strict diet control and to come up with an exercise plan.  We need to see some  weight loss over the next few weeks.  Also to monitor her blood pressure. 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   Total time spent: 30 minutes  Margaretann Loveless, MD  11/18/2022

## 2022-11-27 ENCOUNTER — Encounter: Payer: Self-pay | Admitting: Internal Medicine

## 2022-11-27 ENCOUNTER — Ambulatory Visit (INDEPENDENT_AMBULATORY_CARE_PROVIDER_SITE_OTHER): Payer: BC Managed Care – PPO | Admitting: Internal Medicine

## 2022-11-27 VITALS — BP 122/80 | HR 102 | Ht 69.0 in | Wt 235.8 lb

## 2022-11-27 DIAGNOSIS — I10 Essential (primary) hypertension: Secondary | ICD-10-CM | POA: Diagnosis not present

## 2022-11-27 DIAGNOSIS — J209 Acute bronchitis, unspecified: Secondary | ICD-10-CM

## 2022-11-27 DIAGNOSIS — E782 Mixed hyperlipidemia: Secondary | ICD-10-CM

## 2022-11-27 DIAGNOSIS — Z20822 Contact with and (suspected) exposure to covid-19: Secondary | ICD-10-CM

## 2022-11-27 LAB — POCT XPERT XPRESS SARS COVID-2/FLU/RSV
FLU A: NEGATIVE
FLU B: NEGATIVE
RSV RNA, PCR: NEGATIVE
SARS Coronavirus 2: NEGATIVE

## 2022-11-27 MED ORDER — AZITHROMYCIN 250 MG PO TABS: ORAL_TABLET | ORAL | 0 refills | Status: AC

## 2022-11-27 NOTE — Progress Notes (Signed)
Established Patient Office Visit  Subjective:  Patient ID: Amber Price, female    DOB: 1980/11/15  Age: 42 y.o. MRN: [id number]  Chief Complaint  Patient presents with   Acute Visit    Sore throat since Tuesday.    Patient comes in with 4-day history of URI like symptoms.  Her coworkers were also sick.  She started out with a sore throat which then developed into chest congestion and cough.  She also has some postnasal drip along with headache and sinus congestion.  The cough is coming from her chest and is now producing little sputum which is yellowish in color.  Complains of chills but no fever, no body aches but feels tired.  Will check COVID panel at office.  And send in prescription for Z-Pak.  Patient is negative for COVID/flu/RSV.  Advised to continue her Z-Pak along with Mucinex    No other concerns at this time.   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 RECK 2018 ANNUAL   Sickle cell trait (HCC)    Vitamin D deficiency 01/2016   ADD VIT D3 5000 IU DAILY    Past Surgical History:  Procedure Laterality Date   NO PAST SURGERIES      Social History   Socioeconomic History   Marital status: Single    Spouse name: Not on file   Number of children: 3   Years of education: 14   Highest education level: Not on file  Occupational History   Occupation: BUSINESS OFFICE SPECIALIST  Tobacco Use   Smoking status: Never   Smokeless tobacco: Never  Vaping Use   Vaping Use: Never used  Substance and Sexual Activity   Alcohol use: Not Currently   Drug use: No   Sexual activity: Yes    Birth control/protection: Pill  Other Topics Concern   Not on file  Social History Narrative   Not on file   Social Determinants of Health    Financial Resource Strain: Not on file  Food Insecurity: Not on file  Transportation Needs: Not on file  Physical Activity: Not on file  Stress: Not on file  Social Connections: Not on file  Intimate Partner Violence: Not on file    Family History  Problem Relation Age of Onset   Diabetes Mellitus II Mother    Hypertension Mother    Hypercholesterolemia Father    Cancer Paternal Grandmother 80       LUNG   Sickle cell trait Son     No Known Allergies  Review of Systems  Constitutional:  Positive for chills and malaise/fatigue. Negative for diaphoresis, fever and weight loss.  HENT:  Positive for congestion, sinus pain and sore throat. Negative for ear discharge, ear pain, hearing loss and tinnitus.   Eyes: Negative.   Respiratory:  Positive for cough and sputum production. Negative for shortness of breath and wheezing.   Cardiovascular:  Negative for chest pain, palpitations, leg swelling and PND.  Gastrointestinal:  Positive for constipation. Negative for abdominal pain, blood in stool, diarrhea, heartburn, melena, nausea and vomiting.  Genitourinary: Negative.   Musculoskeletal:  Negative for back pain, falls, myalgias and neck  pain.  Skin: Negative.   Neurological:  Positive for headaches. Negative for dizziness, tingling, tremors, focal weakness and seizures.  Psychiatric/Behavioral:  Negative for depression. The patient is not nervous/anxious.        Objective:   BP 122/80   Pulse (!) 102   Ht 5\' 9"  (1.753 m)   Wt 235 lb 12.8 oz (107 kg)   SpO2 98%   BMI 34.82 kg/m   Vitals:   11/27/22 1304  BP: 122/80  Pulse: (!) 102  Height: 5\' 9"  (1.753 m)  Weight: 235 lb 12.8 oz (107 kg)  SpO2: 98%  BMI (Calculated): 34.81    Physical Exam Vitals and nursing note reviewed.  Constitutional:      General: She is not in acute distress.    Appearance: Normal appearance. She is obese. She is not ill-appearing.  HENT:     Head: Normocephalic.     Right Ear:  Tympanic membrane and ear canal normal.     Left Ear: Tympanic membrane and ear canal normal.     Nose: Rhinorrhea present. No congestion.     Mouth/Throat:     Mouth: Mucous membranes are moist.     Pharynx: Oropharynx is clear. No oropharyngeal exudate or posterior oropharyngeal erythema.  Eyes:     Conjunctiva/sclera: Conjunctivae normal.  Cardiovascular:     Rate and Rhythm: Normal rate and regular rhythm.     Pulses: Normal pulses.     Heart sounds: Normal heart sounds.  Pulmonary:     Effort: Pulmonary effort is normal.     Breath sounds: No wheezing, rhonchi or rales.  Abdominal:     General: Bowel sounds are normal. There is no distension.     Palpations: Abdomen is soft.     Tenderness: There is no abdominal tenderness. There is right CVA tenderness. There is no left CVA tenderness.  Musculoskeletal:        General: No deformity. Normal range of motion.     Cervical back: Normal range of motion and neck supple.     Right lower leg: No edema.  Skin:    General: Skin is warm and dry.  Neurological:     General: No focal deficit present.     Mental Status: She is alert and oriented to person, place, and time.  Psychiatric:        Mood and Affect: Mood normal.        Behavior: Behavior normal.      Results for orders placed or performed in visit on 11/27/22  POCT XPERT XPRESS SARS COVID-2/FLU/RSV [[id number]]  Result Value Ref Range   SARS Coronavirus 2 negative    FLU A negative    FLU B negative    RSV RNA, PCR negative     Recent Results (from the past 2160 hour(s))  CMP14+EGFR     Status: Abnormal   Collection Time: 11/18/22  1:23 PM  Result Value Ref Range   Glucose 74 70 - 99 mg/dL   BUN 7 6 - 24 mg/dL   Creatinine, Ser 5.78 0.57 - 1.00 mg/dL   eGFR 76 >46 NG/EXB/2.84   BUN/Creatinine Ratio 7 (L) 9 - 23   Sodium 138 134 - 144 mmol/L   Potassium 4.9 3.5 - 5.2 mmol/L   Chloride 101 96 - 106 mmol/L   CO2 21 20 - 29 mmol/L   Calcium 9.1 8.7 - 10.2 mg/dL    Total Protein 6.8 6.0 - 8.5 g/dL   Albumin 4.0 3.9 -  4.9 g/dL   Globulin, Total 2.8 1.5 - 4.5 g/dL   Albumin/Globulin Ratio 1.4 1.2 - 2.2   Bilirubin Total 0.4 0.0 - 1.2 mg/dL   Alkaline Phosphatase 57 44 - 121 IU/L   AST 15 0 - 40 IU/L   ALT 9 0 - 32 IU/L  Lipid Panel w/o Chol/HDL Ratio     Status: Abnormal   Collection Time: 11/18/22  1:23 PM  Result Value Ref Range   Cholesterol, Total 197 100 - 199 mg/dL   Triglycerides 295 0 - 149 mg/dL   HDL 54 >62 mg/dL   VLDL Cholesterol Cal 25 5 - 40 mg/dL   LDL Chol Calc (NIH) 130 (H) 0 - 99 mg/dL  CBC With Differential     Status: Abnormal   Collection Time: 11/18/22  1:23 PM  Result Value Ref Range   WBC 9.3 3.4 - 10.8 x10E3/uL   RBC 4.50 3.77 - 5.28 x10E6/uL   Hemoglobin 11.7 11.1 - 15.9 g/dL   Hematocrit 86.5 78.4 - 46.6 %   MCV 82 79 - 97 fL   MCH 26.0 (L) 26.6 - 33.0 pg   MCHC 31.9 31.5 - 35.7 g/dL   RDW 69.6 29.5 - 28.4 %   Neutrophils 67 Not Estab. %   Lymphs 27 Not Estab. %   Monocytes 5 Not Estab. %   Eos 1 Not Estab. %   Basos 0 Not Estab. %   Neutrophils Absolute 6.3 1.4 - 7.0 x10E3/uL   Lymphocytes Absolute 2.5 0.7 - 3.1 x10E3/uL   Monocytes Absolute 0.4 0.1 - 0.9 x10E3/uL   EOS (ABSOLUTE) 0.1 0.0 - 0.4 x10E3/uL   Basophils Absolute 0.0 0.0 - 0.2 x10E3/uL   Immature Granulocytes 0 Not Estab. %   Immature Grans (Abs) 0.0 0.0 - 0.1 x10E3/uL  POCT XPERT XPRESS SARS COVID-2/FLU/RSV [[id number]]     Status: Normal   Collection Time: 11/27/22  2:16 PM  Result Value Ref Range   SARS Coronavirus 2 negative    FLU A negative    FLU B negative    RSV RNA, PCR negative       Assessment & Plan:  Patient advised to rest fluids, Z-Pak, Mucinex. Problem List Items Addressed This Visit     Essential hypertension, benign   Mixed hyperlipidemia   Other Visit Diagnoses     Acute bronchitis, unspecified organism    -  Primary   Relevant Medications   azithromycin (ZITHROMAX) 250 MG tablet   Other Relevant Orders    POCT XPERT XPRESS SARS COVID-2/FLU/RSV [[id number]] (Completed)       Follow up as scheduled.  Total time spent: 25 minutes  Margaretann Loveless, MD  11/27/2022   This document may have been prepared by [facility] Voice Recognition software and as such may include unintentional dictation errors.

## 2022-12-30 ENCOUNTER — Encounter: Payer: Self-pay | Admitting: Internal Medicine

## 2022-12-30 ENCOUNTER — Ambulatory Visit (INDEPENDENT_AMBULATORY_CARE_PROVIDER_SITE_OTHER): Payer: BC Managed Care – PPO | Admitting: Internal Medicine

## 2022-12-30 VITALS — BP 120/80 | HR 104 | Ht 69.0 in | Wt 236.8 lb

## 2022-12-30 DIAGNOSIS — E782 Mixed hyperlipidemia: Secondary | ICD-10-CM | POA: Diagnosis not present

## 2022-12-30 DIAGNOSIS — M05711 Rheumatoid arthritis with rheumatoid factor of right shoulder without organ or systems involvement: Secondary | ICD-10-CM | POA: Diagnosis not present

## 2022-12-30 DIAGNOSIS — E669 Obesity, unspecified: Secondary | ICD-10-CM

## 2022-12-30 DIAGNOSIS — M05712 Rheumatoid arthritis with rheumatoid factor of left shoulder without organ or systems involvement: Secondary | ICD-10-CM

## 2022-12-30 DIAGNOSIS — I10 Essential (primary) hypertension: Secondary | ICD-10-CM

## 2022-12-30 NOTE — Progress Notes (Signed)
Established Patient Office Visit  Subjective:  Patient ID: Amber Price, female    DOB: [birth year]  Age: 42 y.o. MRN: [id number]  Chief Complaint  Patient presents with   Follow-up    6 week follow up    Patient in office for 6 week follow up. Her bronchitis has resolved. And her BP is also better. Patient feeling better. She is trying to lose weight with diet and exercise. Last pap smear 05/2022. Most recent mammogram 08/2022. No further URI like symptoms.  Patient reports exercising, walking three days a week.     No other concerns at this time.   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 RECK 2018 ANNUAL   Sickle cell trait (HCC)    Vitamin D deficiency 01/2016   ADD VIT D3 5000 IU DAILY    Past Surgical History:  Procedure Laterality Date   NO PAST SURGERIES      Social History   Socioeconomic History   Marital status: Single    Spouse name: Not on file   Number of children: 3   Years of education: 14   Highest education level: Not on file  Occupational History   Occupation: BUSINESS OFFICE SPECIALIST  Tobacco Use   Smoking status: Never   Smokeless tobacco: Never  Vaping Use   Vaping Use: Never used  Substance and Sexual Activity   Alcohol use: Not Currently   Drug use: No   Sexual activity: Yes    Birth control/protection: Pill  Other Topics Concern   Not on file  Social History Narrative   Not on file   Social Determinants of Health   Financial Resource Strain: Not on file  Food Insecurity: Not on file  Transportation Needs: Not on file  Physical Activity: Not on file  Stress: Not on file  Social Connections: Not on file  Intimate Partner Violence: Not on file    Family History  Problem Relation Age  of Onset   Diabetes Mellitus II Mother    Hypertension Mother    Hypercholesterolemia Father    Cancer Paternal Grandmother 52       LUNG   Sickle cell trait Son     No Known Allergies  Review of Systems  Constitutional: Negative.   HENT: Negative.    Eyes: Negative.   Respiratory: Negative.  Negative for cough, shortness of breath and wheezing.   Cardiovascular: Negative.  Negative for chest pain and palpitations.  Gastrointestinal: Negative.  Negative for abdominal pain, constipation, diarrhea, heartburn, nausea and vomiting.  Genitourinary: Negative.   Musculoskeletal: Negative.  Negative for back pain and joint pain.  Skin: Negative.   Neurological: Negative.  Negative for dizziness.  Endo/Heme/Allergies: Negative.   Psychiatric/Behavioral: Negative.         Objective:   BP 120/80   Pulse (!) 104   Ht 5\' 9"  (1.753 m)   Wt 236 lb 12.8 oz (107.4 kg)   SpO2 97%   BMI 34.97 kg/m   Vitals:   12/30/22 1002  BP: 120/80  Pulse: (!) 104  Height: 5\' 9"  (1.753 m)  Weight: 236 lb 12.8 oz (107.4 kg)  SpO2: 97%  BMI (Calculated): 34.95    Physical Exam Vitals and nursing note reviewed.  Constitutional:      Appearance: Normal appearance. She is obese.  HENT:     Head: Normocephalic and atraumatic.     Nose: Nose normal.  Cardiovascular:     Rate and Rhythm: Normal rate and regular rhythm.     Heart sounds: Normal heart sounds.  Pulmonary:     Effort: Pulmonary effort is normal.     Breath sounds: Normal breath sounds.  Abdominal:     Palpations: Abdomen is soft.  Musculoskeletal:        General: Normal range of motion.     Cervical back: Normal range of motion.  Skin:    General: Skin is warm and dry.  Neurological:     General: No focal deficit present.     Mental Status: She is alert.  Psychiatric:        Mood and Affect: Mood normal.        Behavior: Behavior normal.      No results found for any visits on 12/30/22.  Recent Results (from the  past 2160 hour(s))  CMP14+EGFR     Status: Abnormal   Collection Time: 11/18/22  1:23 PM  Result Value Ref Range   Glucose 74 70 - 99 mg/dL   BUN 7 6 - 24 mg/dL   Creatinine, Ser 8.29 0.57 - 1.00 mg/dL   eGFR 76 >56 OZ/HYQ/6.57   BUN/Creatinine Ratio 7 (L) 9 - 23   Sodium 138 134 - 144 mmol/L   Potassium 4.9 3.5 - 5.2 mmol/L   Chloride 101 96 - 106 mmol/L   CO2 21 20 - 29 mmol/L   Calcium 9.1 8.7 - 10.2 mg/dL   Total Protein 6.8 6.0 - 8.5 g/dL   Albumin 4.0 3.9 - 4.9 g/dL   Globulin, Total 2.8 1.5 - 4.5 g/dL   Albumin/Globulin Ratio 1.4 1.2 - 2.2   Bilirubin Total 0.4 0.0 - 1.2 mg/dL   Alkaline Phosphatase 57 44 - 121 IU/L   AST 15 0 - 40 IU/L   ALT 9 0 - 32 IU/L  Lipid Panel w/o Chol/HDL Ratio     Status: Abnormal   Collection Time: 11/18/22  1:23 PM  Result Value Ref Range   Cholesterol, Total 197 100 - 199 mg/dL   Triglycerides 846 0 - 149 mg/dL   HDL 54 >96 mg/dL   VLDL Cholesterol Cal 25 5 - 40 mg/dL   LDL Chol Calc (NIH) 295 (H) 0 - 99 mg/dL  CBC With Differential     Status: Abnormal   Collection Time: 11/18/22  1:23 PM  Result Value Ref Range   WBC 9.3 3.4 - 10.8 x10E3/uL   RBC 4.50 3.77 - 5.28 x10E6/uL   Hemoglobin 11.7 11.1 - 15.9 g/dL   Hematocrit 28.4 13.2 - 46.6 %   MCV 82 79 - 97 fL   MCH 26.0 (L) 26.6 - 33.0 pg   MCHC 31.9 31.5 - 35.7 g/dL   RDW 44.0 10.2 - 72.5 %   Neutrophils 67 Not Estab. %   Lymphs 27 Not Estab. %   Monocytes 5 Not Estab. %   Eos 1 Not Estab. %   Basos 0 Not Estab. %   Neutrophils Absolute 6.3 1.4 - 7.0 x10E3/uL   Lymphocytes Absolute 2.5 0.7 - 3.1 x10E3/uL   Monocytes Absolute 0.4 0.1 - 0.9 x10E3/uL   EOS (ABSOLUTE) 0.1 0.0 - 0.4 x10E3/uL   Basophils Absolute 0.0  0.0 - 0.2 x10E3/uL   Immature Granulocytes 0 Not Estab. %   Immature Grans (Abs) 0.0 0.0 - 0.1 x10E3/uL  POCT XPERT XPRESS SARS COVID-2/FLU/RSV [[id number]]     Status: Normal   Collection Time: 11/27/22  2:16 PM  Result Value Ref Range   SARS Coronavirus 2 negative     FLU A negative    FLU B negative    RSV RNA, PCR negative       Assessment & Plan:  Blood work done in May 2024. LDL elevated. Confirms she is taking rosuvastatin as prescribed. Continue to work on diet and exercise. Blood pressure well controlled today on amlodipine.   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   Total time spent: 25 minutes  Margaretann Loveless, MD  12/30/2022   This document may have been prepared by [facility] Voice Recognition software and as such may include unintentional dictation errors.

## 2023-01-05 NOTE — Progress Notes (Signed)
 Rheumatology Follow Up Note  Chief Complaint  Patient presents with  . Rheumatoid arthritis involving multiple sites with positive      Subjective:HPI  Amber Price is a 42 y.o. female is here today for follow up of rheumatoid arthritis stage 2 moderate. The patient's allergies, current medications, past family history, past medical history, past social history, past surgical history and problem list were reviewed and updated as appropriate.   She last took methotrexate and folic acid three weeks ago. She did have mild flare that lasted for about a day. She has no pains or swelling of the joints. She has no fatigue, fever, infection, new skin rash, nasal/oral ulcer or dyspnea or dry cough.   Review of Systems:   Review of Systems  Constitutional:  Negative for fatigue.  HENT:  Negative for mouth sores and trouble swallowing.        Neg: Dry Mouth  Eyes:  Negative for redness.       Neg: Dry Eyes  Respiratory:  Negative for cough and shortness of breath.   Cardiovascular:  Negative for chest pain and leg swelling.  Gastrointestinal:  Negative for constipation, diarrhea and nausea.  Endocrine: Negative for cold intolerance and heat intolerance.  Genitourinary:  Negative for hematuria.  Musculoskeletal:        Per HPI  Skin:  Positive for rash. Negative for color change.  Neurological:  Negative for dizziness, weakness, numbness and headaches.  Hematological:  Does not bruise/bleed easily.  Psychiatric/Behavioral:  Negative for dysphoric mood and sleep disturbance. The patient is not nervous/anxious.   All other systems reviewed and are negative.  Objective:  Vitals:   01/05/23 1324  BP: 122/70  Temp: 36.4 C (97.5 F)  TempSrc: Temporal  Weight: (!) 106.6 kg (235 lb)  Height: 175.3 cm (5' 9)  PainSc: 0-No pain    Length of Stiffness: 1 Hour   GEN - Pleasant, No Apparent Distress  HEENT - normocephalic and atraumatic. Conjunctiva Clear.   Neck - supple with no  adenopathy or thyromegaly.   C spine with full range of motion. Heart - regular rate and rhythm, No murmurs/gallops/rub, Nml S1S2 Lungs - clear to auscultation in all fields. Extremities - there is no cyanosis or edema. Neurological - alert and oriented.  Spine - no paraspinal tenderness; no L spine or SI Joint Tenderness Skin - Base of scalp with silver, scaly rash improved; does have behind the ear and over the eyes  MSK - The following joints were examined bilaterally: Hands, Wrists, Elbows, Shoulders, Metatarsals, Ankels, Knees and Hips; they were normal apart from what is noted.    100% Fist Formation No Synovitis or Tenderness  No Dactylitis No Tender Point ______________________________________________________________________ Labs/Imaging Reviewed in EMR   CMP Cr 0.96, Ast 15, ALT 9 CBC Hgb 11.7, Hct 36.7, Plt 308 Uric Acid 6.3 TSH 2.19 Vitamin D  18.8 (L) Esr 86 (H) --> 41 CRP 37 (H) --> 25 Pos: RF 25.9 (H); AntiCCP 250 (H), Anti-CEP-1 (48), Anti-Sa (62) Neg: ANA Direct, ANA IFA, 14.3.3 ETA Rheum Neg: Hep B, C and Quantiferon    CDAI is calculated as follows:   SDAI = SJC + TJC +PGA + EGA = 2 Where:   SJC = Swollen Joint Count TJC = Tender Joint Count PGA = Patient Global Assessment of Disease Activity EGA = Evaluator Global Assessment of Disease Activity   Interpretation <=2.8: Remission >2.8 and <=10: Low Disease Activity >10 and <=22: Moderate Disease Activity >22: High Disease Activity  Assessment and Plan     Rheumatoid arthritis, stage 2 moderate: Much Better -- Diagnosed 05/2022; Positive RF, Synovitis and Tenderness on Exam, elevated ESR and CRP; father with history of rheumatoid arthritis -- Rash at base of the neck likely psoriasis  -- Restart Methotrexate 12.5 mg weekly and Folic Acid 1 mg Daily -- Check Labs   2. Vitamin D  Deficiency -- Continue with Vitamin D  supplementation   3. Long term use of immunosuppressive therapy -- Methotrexate  is discussed at length including risks, benefits, adverse effects, direction on use and need for monitoring on a routine basis with office visits and labs.  Literature regarding methotrexate was provided to the patient.  Patient directed to call office if any adverse effect develops or if more questions arise.  In addition, issues addressed included mouth sores, skin rashes, gastrointestinal side effects, fever, pulmonary side effects, hair loss, and abnormal lab findings.  Discussed the use of folic acid with methotrexate. -- Patient has been educated on the importance of keeping follow up appointments for disease and medication monitoring.  -- Patient has been educated on the potential adverse events associated with prednisone including but not limited to elevated blood sugars, decrease bone density as well as glaucoma and mood affects.  -- Reviewed labs   Diagnoses and all orders for this visit:  Rheumatoid arthritis involving multiple sites with positive rheumatoid factor (CMS/HHS-HCC) -     methotrexate (RHEUMATREX) 2.5 MG tablet; Take 5 tablets (12.5 mg total) by mouth every 7 (seven) days All on the same day -     folic acid (FOLVITE) 1 MG tablet; Take 1 tablet (1 mg total) by mouth once daily  Long-term use of immunosuppressant medication    Return in about 3 months (around 04/07/2023) for Routine Follow Up.   All new prescription medications, changes in current prescription dosages, and sample medications were discussed with the patient, including patient education, medication name, use, dosage, potential side effects, drug interactions, consequences of not using/taking, and special instructions.  Patient expressed understanding.  No barriers to adherence.   I appreciate the opportunity to participate in the care of [name]. Please do not hesitate to contact me with any questions or concerns that may arise in regards to the patient's rheumatologic disease.    Attestation  Statement:   I personally performed the service. (TP)  MAYUR LOREE BLANCH, MD

## 2023-03-31 ENCOUNTER — Other Ambulatory Visit: Payer: Self-pay | Admitting: Internal Medicine

## 2023-03-31 DIAGNOSIS — Z30011 Encounter for initial prescription of contraceptive pills: Secondary | ICD-10-CM

## 2023-03-31 DIAGNOSIS — Z3009 Encounter for other general counseling and advice on contraception: Secondary | ICD-10-CM

## 2023-03-31 MED ORDER — JUNEL FE 1/20 1-20 MG-MCG PO TABS: 1.0000 | ORAL_TABLET | Freq: Every day | ORAL | 3 refills | Status: DC

## 2023-04-02 ENCOUNTER — Ambulatory Visit: Payer: BC Managed Care – PPO | Admitting: Internal Medicine

## 2023-06-24 ENCOUNTER — Other Ambulatory Visit: Payer: Self-pay | Admitting: Internal Medicine

## 2023-06-24 DIAGNOSIS — Z30011 Encounter for initial prescription of contraceptive pills: Secondary | ICD-10-CM

## 2023-07-02 ENCOUNTER — Encounter: Payer: Self-pay | Admitting: Internal Medicine

## 2023-07-02 ENCOUNTER — Ambulatory Visit (INDEPENDENT_AMBULATORY_CARE_PROVIDER_SITE_OTHER): Payer: BC Managed Care – PPO | Admitting: Internal Medicine

## 2023-07-02 VITALS — BP 128/82 | Ht 69.0 in | Wt 235.7 lb

## 2023-07-02 DIAGNOSIS — Z862 Personal history of diseases of the blood and blood-forming organs and certain disorders involving the immune mechanism: Secondary | ICD-10-CM | POA: Diagnosis not present

## 2023-07-02 DIAGNOSIS — M05711 Rheumatoid arthritis with rheumatoid factor of right shoulder without organ or systems involvement: Secondary | ICD-10-CM

## 2023-07-02 DIAGNOSIS — I10 Essential (primary) hypertension: Secondary | ICD-10-CM

## 2023-07-02 DIAGNOSIS — E782 Mixed hyperlipidemia: Secondary | ICD-10-CM | POA: Diagnosis not present

## 2023-07-02 DIAGNOSIS — M05712 Rheumatoid arthritis with rheumatoid factor of left shoulder without organ or systems involvement: Secondary | ICD-10-CM

## 2023-07-02 DIAGNOSIS — R7309 Other abnormal glucose: Secondary | ICD-10-CM

## 2023-07-02 DIAGNOSIS — E669 Obesity, unspecified: Secondary | ICD-10-CM

## 2023-07-02 MED ORDER — AMLODIPINE BESYLATE 5 MG PO TABS: 5.0000 mg | ORAL_TABLET | Freq: Every day | ORAL | 11 refills | Status: AC

## 2023-07-02 NOTE — Progress Notes (Signed)
Established Patient Office Visit  Subjective:  Patient ID: Amber Price, female    DOB: 1981/03/06  Age: 42 y.o. MRN: [id number]  Chief Complaint  Patient presents with   Follow-up    Patient comes in for her follow-up today.  She is feeling well and has no new complaints.  She is fasting for her blood work.  She is still trying to diet and exercise to help lose weight.    No other concerns at this time.   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 RECK 2018 ANNUAL   Sickle cell trait (HCC)    Vitamin D deficiency 01/2016   ADD VIT D3 5000 IU DAILY    Past Surgical History:  Procedure Laterality Date   NO PAST SURGERIES      Social History   Socioeconomic History   Marital status: Single    Spouse name: Not on file   Number of children: 3   Years of education: 14   Highest education level: Not on file  Occupational History   Occupation: BUSINESS OFFICE SPECIALIST  Tobacco Use   Smoking status: Never   Smokeless tobacco: Never  Vaping Use   Vaping status: Never Used  Substance and Sexual Activity   Alcohol use: Not Currently   Drug use: No   Sexual activity: Yes    Birth control/protection: Pill  Other Topics Concern   Not on file  Social History Narrative   Not on file   Social Drivers of Health   Financial Resource Strain: Not on file  Food Insecurity: Not on file  Transportation Needs: Not on file  Physical Activity: Not on file  Stress: Not on file  Social Connections: Not on file  Intimate Partner Violence: Not on file    Family History  Problem Relation Age of Onset   Diabetes Mellitus II Mother    Hypertension Mother    Hypercholesterolemia Father    Cancer Paternal Grandmother 71       LUNG    Sickle cell trait Son     No Known Allergies  Outpatient Medications Prior to Visit  Medication Sig   amLODipine (NORVASC) 5 MG tablet Take 1 tablet (5 mg total) by mouth daily.   celecoxib (CELEBREX) 200 MG capsule Take 200 mg by mouth daily.   Cholecalciferol (VITAMIN D3) 1.25 MG (50000 UT) CAPS Take 1 capsule (1.25 mg total) by mouth once a week.   JUNEL FE 1/20 1-20 MG-MCG tablet TAKE 1 TABLET BY MOUTH EVERY DAY   methotrexate (RHEUMATREX) 2.5 MG tablet Take 2.5 mg by mouth once a week.   rosuvastatin (CRESTOR) 20 MG tablet Take 1 tablet (20 mg total) by mouth daily.   Facility-Administered Medications Prior to Visit  Medication Dose Route Frequency Provider   multivitamin with minerals tablet 1 tablet  1 tablet Oral Daily Federico Flake, MD    Review of Systems  Constitutional: Negative.  Negative for chills, diaphoresis, fever, malaise/fatigue and weight loss.  HENT: Negative.  Negative for nosebleeds and sore throat.   Eyes: Negative.   Respiratory: Negative.  Negative for cough, shortness of breath and stridor.   Cardiovascular:  Negative.  Negative for chest pain, palpitations and leg swelling.  Gastrointestinal: Negative.  Negative for abdominal pain, constipation, diarrhea, heartburn, nausea and vomiting.  Genitourinary: Negative.  Negative for dysuria and flank pain.  Musculoskeletal: Negative.  Negative for joint pain and myalgias.  Skin: Negative.   Neurological: Negative.  Negative for dizziness and headaches.  Endo/Heme/Allergies: Negative.   Psychiatric/Behavioral: Negative.  Negative for depression and suicidal ideas. The patient is not nervous/anxious.        Objective:   BP 128/82   Ht 5\' 9"  (1.753 m)   Wt 235 lb 11.2 oz (106.9 kg)   BMI 34.81 kg/m   Vitals:   07/02/23 0910  BP: 128/82  Height: 5\' 9"  (1.753 m)  Weight: 235 lb 11.2 oz (106.9 kg)  BMI (Calculated): 34.79    Physical Exam Vitals and nursing note reviewed.  Constitutional:       Appearance: Normal appearance.  HENT:     Head: Normocephalic and atraumatic.     Nose: Nose normal.     Mouth/Throat:     Mouth: Mucous membranes are moist.     Pharynx: Oropharynx is clear.  Eyes:     Conjunctiva/sclera: Conjunctivae normal.     Pupils: Pupils are equal, round, and reactive to light.  Cardiovascular:     Rate and Rhythm: Normal rate and regular rhythm.     Pulses: Normal pulses.     Heart sounds: Normal heart sounds. No murmur heard. Pulmonary:     Effort: Pulmonary effort is normal.     Breath sounds: Normal breath sounds. No wheezing.  Abdominal:     General: Bowel sounds are normal.     Palpations: Abdomen is soft.     Tenderness: There is no abdominal tenderness. There is no right CVA tenderness or left CVA tenderness.  Musculoskeletal:        General: Normal range of motion.     Cervical back: Normal range of motion.     Right lower leg: No edema.     Left lower leg: No edema.  Skin:    General: Skin is warm and dry.  Neurological:     General: No focal deficit present.     Mental Status: She is alert and oriented to person, place, and time.  Psychiatric:        Mood and Affect: Mood normal.        Behavior: Behavior normal.      No results found for any visits on 07/02/23.  No results found for this or any previous visit (from the past 2160 hours).    Assessment & Plan:  Patient advised to continue all her medications.  Check labs today.  Will fill out her health form once results are available. Monitor blood pressure. 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   Total time spent: 30 minutes  Margaretann Loveless, MD  07/02/2023   This document may have been prepared by Dragon Voice Recognition software and as such may include unintentional dictation errors.

## 2023-07-03 LAB — CBC WITH DIFFERENTIAL/PLATELET
Basophils Absolute: 0 10*3/uL (ref 0.0–0.2)
Basos: 0 %
EOS (ABSOLUTE): 0.1 10*3/uL (ref 0.0–0.4)
Eos: 1 %
Hematocrit: 36.4 % (ref 34.0–46.6)
Hemoglobin: 11.7 g/dL (ref 11.1–15.9)
Immature Grans (Abs): 0 10*3/uL (ref 0.0–0.1)
Immature Granulocytes: 0 %
Lymphocytes Absolute: 1.5 10*3/uL (ref 0.7–3.1)
Lymphs: 22 %
MCH: 26.4 pg — ABNORMAL LOW (ref 26.6–33.0)
MCHC: 32.1 g/dL (ref 31.5–35.7)
MCV: 82 fL (ref 79–97)
Monocytes Absolute: 0.4 10*3/uL (ref 0.1–0.9)
Monocytes: 6 %
Neutrophils Absolute: 4.8 10*3/uL (ref 1.4–7.0)
Neutrophils: 71 %
Platelets: 326 10*3/uL (ref 150–450)
RBC: 4.43 x10E6/uL (ref 3.77–5.28)
RDW: 14.8 % (ref 11.7–15.4)
WBC: 6.8 10*3/uL (ref 3.4–10.8)

## 2023-07-03 LAB — LIPID PANEL W/O CHOL/HDL RATIO
Cholesterol, Total: 203 mg/dL — ABNORMAL HIGH (ref 100–199)
HDL: 52 mg/dL (ref >39)
LDL Chol Calc (NIH): 125 mg/dL — ABNORMAL HIGH (ref 0–99)
Triglycerides: 144 mg/dL (ref 0–149)
VLDL Cholesterol Cal: 26 mg/dL (ref 5–40)

## 2023-07-03 LAB — HEMOGLOBIN A1C
Est. average glucose Bld gHb Est-mCnc: 131 mg/dL
Hgb A1c MFr Bld: 6.2 % — ABNORMAL HIGH (ref 4.8–5.6)

## 2023-07-03 LAB — CMP14+EGFR
ALT: 11 [IU]/L (ref 0–32)
AST: 11 [IU]/L (ref 0–40)
Albumin: 4.1 g/dL (ref 3.9–4.9)
Alkaline Phosphatase: 65 [IU]/L (ref 44–121)
BUN/Creatinine Ratio: 9 (ref 9–23)
BUN: 10 mg/dL (ref 6–24)
Bilirubin Total: 0.5 mg/dL (ref 0.0–1.2)
CO2: 21 mmol/L (ref 20–29)
Calcium: 9.4 mg/dL (ref 8.7–10.2)
Chloride: 102 mmol/L (ref 96–106)
Creatinine, Ser: 1.06 mg/dL — ABNORMAL HIGH (ref 0.57–1.00)
Globulin, Total: 2.7 g/dL (ref 1.5–4.5)
Glucose: 82 mg/dL (ref 70–99)
Potassium: 4.9 mmol/L (ref 3.5–5.2)
Sodium: 138 mmol/L (ref 134–144)
Total Protein: 6.8 g/dL (ref 6.0–8.5)
eGFR: 67 mL/min/{1.73_m2} (ref >59)

## 2023-08-19 ENCOUNTER — Other Ambulatory Visit: Payer: Self-pay | Admitting: Internal Medicine

## 2023-08-19 DIAGNOSIS — M05711 Rheumatoid arthritis with rheumatoid factor of right shoulder without organ or systems involvement: Secondary | ICD-10-CM

## 2023-08-20 ENCOUNTER — Other Ambulatory Visit (HOSPITAL_COMMUNITY): Payer: Self-pay

## 2023-08-20 ENCOUNTER — Other Ambulatory Visit: Payer: Self-pay

## 2023-08-20 ENCOUNTER — Encounter: Payer: Self-pay | Admitting: Pharmacist

## 2023-08-20 MED ORDER — CELECOXIB 200 MG PO CAPS
200.0000 mg | ORAL_CAPSULE | Freq: Every day | ORAL | 2 refills | Status: DC
  Filled 2023-08-20: qty 30, 30d supply, fill #0

## 2023-08-25 ENCOUNTER — Other Ambulatory Visit: Payer: Self-pay

## 2023-08-27 ENCOUNTER — Other Ambulatory Visit: Payer: Self-pay | Admitting: Internal Medicine

## 2023-08-27 DIAGNOSIS — E559 Vitamin D deficiency, unspecified: Secondary | ICD-10-CM

## 2023-10-02 ENCOUNTER — Ambulatory Visit: Payer: BC Managed Care – PPO | Admitting: Internal Medicine

## 2023-10-12 ENCOUNTER — Encounter: Payer: Self-pay | Admitting: Internal Medicine

## 2023-10-12 ENCOUNTER — Ambulatory Visit (INDEPENDENT_AMBULATORY_CARE_PROVIDER_SITE_OTHER): Admitting: Internal Medicine

## 2023-10-12 VITALS — BP 118/68 | HR 97 | Ht 69.0 in | Wt 241.6 lb

## 2023-10-12 DIAGNOSIS — E669 Obesity, unspecified: Secondary | ICD-10-CM

## 2023-10-12 DIAGNOSIS — E782 Mixed hyperlipidemia: Secondary | ICD-10-CM

## 2023-10-12 DIAGNOSIS — I10 Essential (primary) hypertension: Secondary | ICD-10-CM | POA: Diagnosis not present

## 2023-10-12 DIAGNOSIS — M05712 Rheumatoid arthritis with rheumatoid factor of left shoulder without organ or systems involvement: Secondary | ICD-10-CM

## 2023-10-12 DIAGNOSIS — Z1231 Encounter for screening mammogram for malignant neoplasm of breast: Secondary | ICD-10-CM

## 2023-10-12 DIAGNOSIS — R7303 Prediabetes: Secondary | ICD-10-CM | POA: Diagnosis not present

## 2023-10-12 DIAGNOSIS — M05711 Rheumatoid arthritis with rheumatoid factor of right shoulder without organ or systems involvement: Secondary | ICD-10-CM

## 2023-10-12 NOTE — Progress Notes (Signed)
 Established Patient Office Visit  Subjective:  Patient ID: Amber Price, female    DOB: 1980-11-23  Age: 43 y.o. MRN: [id number]  Chief Complaint  Patient presents with   Follow-up    3 month follow up    Patient comes in for her follow-up today.  She is generally feeling well but has gained some weight.  She is taking all her medications regularly.  Last Pap smear was done in 2023 which was negative, HPV negative.  Next due in 2026.   Needs to schedule a mammogram. She is also fasting for labs    No other concerns at this time.   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 RECK 2018 ANNUAL   Sickle cell trait (HCC)    Vitamin D deficiency 01/2016   ADD VIT D3 5000 IU DAILY    Past Surgical History:  Procedure Laterality Date   NO PAST SURGERIES      Social History   Socioeconomic History   Marital status: Single    Spouse name: Not on file   Number of children: 3   Years of education: 14   Highest education level: Not on file  Occupational History   Occupation: BUSINESS OFFICE SPECIALIST  Tobacco Use   Smoking status: Never   Smokeless tobacco: Never  Vaping Use   Vaping status: Never Used  Substance and Sexual Activity   Alcohol use: Not Currently   Drug use: No   Sexual activity: Yes    Birth control/protection: Pill  Other Topics Concern   Not on file  Social History Narrative   Not on file   Social Drivers of Health   Financial Resource Strain: Not on file  Food Insecurity: Not on file  Transportation Needs: Not on file  Physical Activity: Not on file  Stress: Not on file  Social Connections: Not on file  Intimate Partner Violence: Not on file    Family History  Problem Relation Age of Onset   Diabetes  Mellitus II Mother    Hypertension Mother    Hypercholesterolemia Father    Cancer Paternal Grandmother 77       LUNG   Sickle cell trait Son     No Known Allergies  Outpatient Medications Prior to Visit  Medication Sig   amLODipine (NORVASC) 5 MG tablet Take 1 tablet (5 mg total) by mouth daily.   celecoxib (CELEBREX) 200 MG capsule Take 1 capsule (200 mg total) by mouth daily.   Cholecalciferol (VITAMIN D3) 1.25 MG (50000 UT) CAPS TAKE 1 CAPSULE BY MOUTH ONE TIME PER WEEK   JUNEL FE 1/20 1-20 MG-MCG tablet TAKE 1 TABLET BY MOUTH EVERY DAY   methotrexate (RHEUMATREX) 2.5 MG tablet Take 2.5 mg by mouth once a week.   rosuvastatin (CRESTOR) 20 MG tablet Take 1 tablet (20 mg total) by mouth daily.   Facility-Administered Medications Prior to Visit  Medication Dose Route Frequency Provider   multivitamin with minerals tablet 1 tablet  1 tablet Oral Daily Federico Flake, MD    Review of Systems  Constitutional: Negative.  Negative for chills, fever, malaise/fatigue and weight loss.  HENT: Negative.  Negative for nosebleeds and sore throat.   Eyes: Negative.   Respiratory: Negative.  Negative for cough, shortness of breath and stridor.   Cardiovascular: Negative.  Negative for chest pain, palpitations and leg swelling.  Gastrointestinal: Negative.  Negative for abdominal pain, constipation, diarrhea, heartburn, nausea and vomiting.  Genitourinary: Negative.  Negative for dysuria and flank pain.  Musculoskeletal: Negative.  Negative for joint pain and myalgias.  Skin: Negative.   Neurological: Negative.  Negative for dizziness and headaches.  Endo/Heme/Allergies: Negative.   Psychiatric/Behavioral: Negative.  Negative for depression and suicidal ideas. The patient is not nervous/anxious.        Objective:   BP 118/68   Pulse 97   Ht 5\' 9"  (1.753 m)   Wt 241 lb 9.6 oz (109.6 kg)   SpO2 98%   BMI 35.68 kg/m   Vitals:   10/12/23 0940  BP: 118/68  Pulse: 97   Height: 5\' 9"  (1.753 m)  Weight: 241 lb 9.6 oz (109.6 kg)  SpO2: 98%  BMI (Calculated): 35.66    Physical Exam Vitals and nursing note reviewed.  Constitutional:      Appearance: Normal appearance.  HENT:     Head: Normocephalic and atraumatic.     Nose: Nose normal.     Mouth/Throat:     Mouth: Mucous membranes are moist.     Pharynx: Oropharynx is clear.  Eyes:     Conjunctiva/sclera: Conjunctivae normal.     Pupils: Pupils are equal, round, and reactive to light.  Cardiovascular:     Rate and Rhythm: Normal rate and regular rhythm.     Pulses: Normal pulses.     Heart sounds: Normal heart sounds. No murmur heard. Pulmonary:     Effort: Pulmonary effort is normal.     Breath sounds: Normal breath sounds. No wheezing.  Abdominal:     General: Bowel sounds are normal.     Palpations: Abdomen is soft.     Tenderness: There is no abdominal tenderness. There is no right CVA tenderness or left CVA tenderness.  Musculoskeletal:        General: Normal range of motion.     Cervical back: Normal range of motion.     Right lower leg: No edema.     Left lower leg: No edema.  Skin:    General: Skin is warm and dry.  Neurological:     General: No focal deficit present.     Mental Status: She is alert and oriented to person, place, and time.  Psychiatric:        Mood and Affect: Mood normal.        Behavior: Behavior normal.      No results found for any visits on 10/12/23.  No results found for this or any previous visit (from the past 2160 hours).    Assessment & Plan:  Patient advised to continue taking all her medications.  Strict diet control and exercise recommended to help her lose weight.  Check blood work today. 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   Total time spent: 30 minutes  Margaretann Loveless, MD  10/12/2023   This document may have been prepared by [facility] Voice Recognition software and as such may include unintentional dictation errors.

## 2023-10-13 LAB — CBC WITH DIFFERENTIAL/PLATELET
Basophils Absolute: 0.1 10*3/uL (ref 0.0–0.2)
Basos: 1 %
EOS (ABSOLUTE): 0.1 10*3/uL (ref 0.0–0.4)
Eos: 1 %
Hematocrit: 37.5 % (ref 34.0–46.6)
Hemoglobin: 11.7 g/dL (ref 11.1–15.9)
Immature Grans (Abs): 0 10*3/uL (ref 0.0–0.1)
Immature Granulocytes: 0 %
Lymphocytes Absolute: 1.4 10*3/uL (ref 0.7–3.1)
Lymphs: 18 %
MCH: 25.6 pg — ABNORMAL LOW (ref 26.6–33.0)
MCHC: 31.2 g/dL — ABNORMAL LOW (ref 31.5–35.7)
MCV: 82 fL (ref 79–97)
Monocytes Absolute: 0.4 10*3/uL (ref 0.1–0.9)
Monocytes: 5 %
Neutrophils Absolute: 6 10*3/uL (ref 1.4–7.0)
Neutrophils: 75 %
Platelets: 354 10*3/uL (ref 150–450)
RBC: 4.57 x10E6/uL (ref 3.77–5.28)
RDW: 15 % (ref 11.7–15.4)
WBC: 8.1 10*3/uL (ref 3.4–10.8)

## 2023-10-13 LAB — LIPID PANEL W/O CHOL/HDL RATIO
Cholesterol, Total: 162 mg/dL (ref 100–199)
HDL: 53 mg/dL (ref >39)
LDL Chol Calc (NIH): 90 mg/dL (ref 0–99)
Triglycerides: 107 mg/dL (ref 0–149)
VLDL Cholesterol Cal: 19 mg/dL (ref 5–40)

## 2023-10-13 LAB — CMP14+EGFR
ALT: 9 IU/L (ref 0–32)
AST: 16 IU/L (ref 0–40)
Albumin: 3.9 g/dL (ref 3.9–4.9)
Alkaline Phosphatase: 62 IU/L (ref 44–121)
BUN/Creatinine Ratio: 8 — ABNORMAL LOW (ref 9–23)
BUN: 9 mg/dL (ref 6–24)
Bilirubin Total: 0.3 mg/dL (ref 0.0–1.2)
CO2: 22 mmol/L (ref 20–29)
Calcium: 9.4 mg/dL (ref 8.7–10.2)
Chloride: 104 mmol/L (ref 96–106)
Creatinine, Ser: 1.17 mg/dL — ABNORMAL HIGH (ref 0.57–1.00)
Globulin, Total: 2.7 g/dL (ref 1.5–4.5)
Glucose: 88 mg/dL (ref 70–99)
Potassium: 4.9 mmol/L (ref 3.5–5.2)
Sodium: 138 mmol/L (ref 134–144)
Total Protein: 6.6 g/dL (ref 6.0–8.5)
eGFR: 60 mL/min/{1.73_m2} (ref >59)

## 2023-10-13 LAB — HEMOGLOBIN A1C
Est. average glucose Bld gHb Est-mCnc: 128 mg/dL
Hgb A1c MFr Bld: 6.1 % — ABNORMAL HIGH (ref 4.8–5.6)

## 2023-10-14 NOTE — Progress Notes (Signed)
 Patient notified

## 2023-11-20 ENCOUNTER — Other Ambulatory Visit: Payer: Self-pay | Admitting: Internal Medicine

## 2023-11-23 NOTE — Progress Notes (Signed)
 Rheumatology Follow Up Note  Chief Complaint  Patient presents with  . Rheumatoid Arthritis      Subjective:HPI  Amber Price is a 43 y.o. female is here today for follow up of rheumatoid arthritis stage 2 moderate. The patient's allergies, current medications, past family history, past medical history, past social history, past surgical history and problem list were reviewed and updated as appropriate.   She is taking methotrexate and folic acid. She is tolerating the medications well. She has no pains or swelling of the joints today. She did have one episode of pain and mild swelling of the left wrist and middle finger that only lasted for a day. She has no fatigue, fever, infection, new skin rash, nasal/oral ulcer or dyspnea or dry cough. She has no hospitalizations since last evaluation.   Review of Systems:   Review of Systems  Constitutional:  Negative for fatigue.  HENT:  Negative for mouth sores and trouble swallowing.        Neg: Dry Mouth  Eyes:  Negative for redness.       Neg: Dry Eyes  Respiratory:  Negative for cough and shortness of breath.   Cardiovascular:  Negative for chest pain and leg swelling.  Gastrointestinal:  Negative for constipation, diarrhea and nausea.  Endocrine: Negative for cold intolerance and heat intolerance.  Genitourinary:  Negative for hematuria.  Musculoskeletal:        Per HPI  Skin:  Positive for rash. Negative for color change.  Neurological:  Negative for dizziness, weakness, numbness and headaches.  Hematological:  Does not bruise/bleed easily.  Psychiatric/Behavioral:  Negative for dysphoric mood and sleep disturbance. The patient is not nervous/anxious.   All other systems reviewed and are negative.  Objective:  Vitals:   11/23/23 0913  BP: 126/80  Temp: 36.2 C (97.2 F)  TempSrc: Temporal  Weight: (!) 106.6 kg (235 lb)  Height: 175.3 cm (5' 9)  PainSc: 0-No pain   Length of Stiffness: 1 Hour   GEN - Pleasant, No Apparent  Distress  HEENT - normocephalic and atraumatic. Conjunctiva Clear.   Neck - supple with no adenopathy or thyromegaly.   C spine with full range of motion. Heart - regular rate and rhythm, No murmurs/gallops/rub, Nml S1S2 Lungs - clear to auscultation in all fields. Extremities - there is no cyanosis or edema. Neurological - alert and oriented.  Spine - no paraspinal tenderness; no L spine or SI Joint Tenderness Skin - Base of scalp with silver, scaly rash improved; does have behind the ear and over the eyes  MSK - The following joints were examined bilaterally: Hands, Wrists, Elbows, Shoulders, Metatarsals, Ankels, Knees and Hips; they were normal apart from what is noted.    100% Fist Formation No Synovitis or Tenderness  No Dactylitis No Tender Point ______________________________________________________________________ Labs/Imaging Reviewed in EMR   Cr 1.17, Ast 16, ALT 09 CBC Hgb 11.7, Hct 37.5, Plt 354 Uric Acid 6.3 TSH 2.19 Esr 86 (H) --> 52 CRP 37 (H) --> 25 Pos: RF 25.9 (H); AntiCCP 250 (H), Anti-CEP-1 (48), Anti-Sa (62) Neg: ANA Direct, ANA IFA, 14.3.3 ETA Rheum Neg: Hep B, C and Quantiferon    CDAI is calculated as follows:   CDAI = SJC + TJC +PGA + EGA = 2 Where:   SJC = Swollen Joint Count TJC = Tender Joint Count PGA = Patient Global Assessment of Disease Activity EGA = Evaluator Global Assessment of Disease Activity   Interpretation <=2.8: Remission >2.8 and <=10: Low Disease  Activity >10 and <=22: Moderate Disease Activity >22: High Disease Activity     Assessment and Plan     Rheumatoid arthritis, stage 2 moderate: Stable  -- Diagnosed 05/2022; Positive RF, Synovitis and Tenderness on Exam, elevated ESR and CRP; father with history of rheumatoid arthritis -- Rash at base of the neck likely psoriasis  -- Continue Methotrexate 12.5 mg weekly and Folic Acid 1 mg Daily -- Can take Celebrex  as needed for pains  -- Reviewed Labs   2. Vitamin D   Deficiency -- Continue with Vitamin D  supplementation   3. Long term use of immunosuppressive therapy -- Methotrexate is discussed at length including risks, benefits, adverse effects, direction on use and need for monitoring on a routine basis with office visits and labs.  Literature regarding methotrexate was provided to the patient.  Patient directed to call office if any adverse effect develops or if more questions arise.  In addition, issues addressed included mouth sores, skin rashes, gastrointestinal side effects, fever, pulmonary side effects, hair loss, and abnormal lab findings.  Discussed the use of folic acid with methotrexate. -- Patient has been educated on the importance of keeping follow up appointments for disease and medication monitoring.  -- Patient has been educated on the potential adverse events associated with prednisone including but not limited to elevated blood sugars, decrease bone density as well as glaucoma and mood affects.  -- Reviewed labs   Diagnoses and all orders for this visit:  Rheumatoid arthritis involving multiple sites with positive rheumatoid factor (CMS/HHS-HCC)  Long-term use of immunosuppressant medication    Return in about 4 months (around 03/25/2024) for Routine Follow Up.   All new prescription medications, changes in current prescription dosages, and sample medications were discussed with the patient, including patient education, medication name, use, dosage, potential side effects, drug interactions, consequences of not using/taking, and special instructions.  Patient expressed understanding.  No barriers to adherence.   I appreciate the opportunity to participate in the care of Terin Dierolf. Please do not hesitate to contact me with any questions or concerns that may arise in regards to the patient's rheumatologic disease.    Attestation Statement:   I personally performed the service. (TP)  MAYUR LOREE BLANCH, MD

## 2023-11-25 ENCOUNTER — Other Ambulatory Visit: Payer: Self-pay | Admitting: Family

## 2023-11-25 DIAGNOSIS — Z30011 Encounter for initial prescription of contraceptive pills: Secondary | ICD-10-CM

## 2024-01-11 ENCOUNTER — Encounter: Payer: Self-pay | Admitting: Internal Medicine

## 2024-01-11 ENCOUNTER — Ambulatory Visit (INDEPENDENT_AMBULATORY_CARE_PROVIDER_SITE_OTHER): Admitting: Internal Medicine

## 2024-01-11 VITALS — BP 126/78 | HR 83 | Ht 69.0 in | Wt 244.2 lb

## 2024-01-11 DIAGNOSIS — M05711 Rheumatoid arthritis with rheumatoid factor of right shoulder without organ or systems involvement: Secondary | ICD-10-CM

## 2024-01-11 DIAGNOSIS — R7303 Prediabetes: Secondary | ICD-10-CM | POA: Diagnosis not present

## 2024-01-11 DIAGNOSIS — E782 Mixed hyperlipidemia: Secondary | ICD-10-CM

## 2024-01-11 DIAGNOSIS — I10 Essential (primary) hypertension: Secondary | ICD-10-CM | POA: Diagnosis not present

## 2024-01-11 DIAGNOSIS — M05712 Rheumatoid arthritis with rheumatoid factor of left shoulder without organ or systems involvement: Secondary | ICD-10-CM

## 2024-01-11 MED ORDER — VALACYCLOVIR HCL 500 MG PO TABS: 500.0000 mg | ORAL_TABLET | Freq: Every day | ORAL | 3 refills | Status: AC

## 2024-01-11 NOTE — Progress Notes (Signed)
 Established Patient Office Visit  Subjective:  Patient ID: Amber Price, female    DOB: July 04, 1981  Age: 43 y.o. MRN: [id number]  Chief Complaint  Patient presents with   Follow-up    3 month follow up    Patient comes in for her follow-up today.  She is generally feeling well but continues to have arthritic aches and pains due to her rheumatoid arthritis.  Her initial blood pressure is high but she has not taken her medications yet.  She is fasting for blood work.  Repeat blood pressure reading shows an improvement.    No other concerns at this time.   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 RECK 2018 ANNUAL   Sickle cell trait (HCC)    Vitamin D  deficiency 01/2016   ADD VIT D3 5000 IU DAILY    Past Surgical History:  Procedure Laterality Date   NO PAST SURGERIES      Social History   Socioeconomic History   Marital status: Single    Spouse name: Not on file   Number of children: 3   Years of education: 14   Highest education level: Not on file  Occupational History   Occupation: BUSINESS OFFICE SPECIALIST  Tobacco Use   Smoking status: Never   Smokeless tobacco: Never  Vaping Use   Vaping status: Never Used  Substance and Sexual Activity   Alcohol use: Not Currently   Drug use: No   Sexual activity: Yes    Birth control/protection: Pill  Other Topics Concern   Not on file  Social History Narrative   Not on file   Social Drivers of Health   Financial Resource Strain: Not on file  Food Insecurity: Not on file  Transportation Needs: Not on file  Physical Activity: Not on file  Stress: Not on file  Social Connections: Not on file  Intimate Partner Violence: Not on file    Family History  Problem Relation Age  of Onset   Diabetes Mellitus II Mother    Hypertension Mother    Hypercholesterolemia Father    Cancer Paternal Grandmother 70       LUNG   Sickle cell trait Son     No Known Allergies  Outpatient Medications Prior to Visit  Medication Sig   amLODipine  (NORVASC ) 5 MG tablet Take 1 tablet (5 mg total) by mouth daily.   Cholecalciferol (VITAMIN D3) 1.25 MG (50000 UT) CAPS TAKE 1 CAPSULE BY MOUTH ONE TIME PER WEEK   JUNEL  FE 1/20 1-20 MG-MCG tablet TAKE 1 TABLET BY MOUTH EVERY DAY   methotrexate (RHEUMATREX) 2.5 MG tablet Take 2.5 mg by mouth once a week.   rosuvastatin  (CRESTOR ) 20 MG tablet TAKE 1 TABLET BY MOUTH EVERY DAY   [DISCONTINUED] valACYclovir  (VALTREX ) 500 MG tablet Take 500 mg by mouth 2 (two) times daily as needed.   celecoxib  (CELEBREX ) 200 MG capsule Take 1 capsule (200 mg total) by mouth daily. (Patient not taking: Reported on 01/11/2024)   Facility-Administered Medications Prior to Visit  Medication Dose Route Frequency Provider   multivitamin with minerals tablet 1 tablet  1 tablet Oral Daily Eldonna Suzen Octave, MD  Review of Systems  Constitutional: Negative.  Negative for chills, fever, malaise/fatigue and weight loss.  HENT: Negative.  Negative for sore throat.   Eyes: Negative.   Respiratory: Negative.  Negative for cough and shortness of breath.   Cardiovascular: Negative.  Negative for chest pain, palpitations and leg swelling.  Gastrointestinal: Negative.  Negative for abdominal pain, constipation, diarrhea, heartburn, nausea and vomiting.  Genitourinary: Negative.  Negative for dysuria and flank pain.  Musculoskeletal: Negative.  Negative for joint pain and myalgias.  Skin: Negative.   Neurological: Negative.  Negative for dizziness, tingling, tremors and headaches.  Endo/Heme/Allergies: Negative.   Psychiatric/Behavioral: Negative.  Negative for depression and suicidal ideas. The patient is not nervous/anxious.        Objective:   BP 126/78    Pulse 83   Ht 5' 9 (1.753 m)   Wt 244 lb 3.2 oz (110.8 kg)   SpO2 98%   BMI 36.06 kg/m   Vitals:   01/11/24 0939 01/11/24 0956  BP: (!) 150/90 126/78  Pulse: 83   Height: 5' 9 (1.753 m)   Weight: 244 lb 3.2 oz (110.8 kg)   SpO2: 98%   BMI (Calculated): 36.05     Physical Exam Vitals and nursing note reviewed.  Constitutional:      Appearance: Normal appearance.  HENT:     Head: Normocephalic and atraumatic.     Nose: Nose normal.     Mouth/Throat:     Mouth: Mucous membranes are moist.     Pharynx: Oropharynx is clear.   Eyes:     Conjunctiva/sclera: Conjunctivae normal.     Pupils: Pupils are equal, round, and reactive to light.    Cardiovascular:     Rate and Rhythm: Normal rate and regular rhythm.     Pulses: Normal pulses.     Heart sounds: Normal heart sounds. No murmur heard. Pulmonary:     Effort: Pulmonary effort is normal.     Breath sounds: Normal breath sounds. No wheezing.  Abdominal:     General: Bowel sounds are normal.     Palpations: Abdomen is soft.     Tenderness: There is no abdominal tenderness. There is no right CVA tenderness or left CVA tenderness.   Musculoskeletal:        General: Normal range of motion.     Cervical back: Normal range of motion.     Right lower leg: No edema.     Left lower leg: No edema.   Skin:    General: Skin is warm and dry.   Neurological:     General: No focal deficit present.     Mental Status: She is alert and oriented to person, place, and time.   Psychiatric:        Mood and Affect: Mood normal.        Behavior: Behavior normal.      No results found for any visits on 01/11/24.  No results found for this or any previous visit (from the past 2160 hours).    Assessment & Plan:  Continue current medications.  Strict diet control and exercise as tolerated.  Check blood work today. Problem List Items Addressed This Visit     Essential hypertension, benign - Primary   Rheumatoid arthritis  involving both shoulders with positive rheumatoid factor (HCC)   Relevant Orders   CBC with Diff   Mixed hyperlipidemia   Relevant Orders   CMP14+EGFR   Lipid Panel w/o Chol/HDL Ratio   Prediabetes  Relevant Orders   Hemoglobin A1c    Return in about 3 months (around 04/12/2024).   Total time spent: 30 minutes  FERNAND FREDY RAMAN, MD  01/11/2024   This document may have been prepared by [facility] Voice Recognition software and as such may include unintentional dictation errors.

## 2024-01-12 ENCOUNTER — Ambulatory Visit: Payer: Self-pay | Admitting: Internal Medicine

## 2024-01-12 LAB — CMP14+EGFR
ALT: 9 IU/L (ref 0–32)
AST: 14 IU/L (ref 0–40)
Albumin: 3.8 g/dL — ABNORMAL LOW (ref 3.9–4.9)
Alkaline Phosphatase: 62 IU/L (ref 44–121)
BUN/Creatinine Ratio: 8 — ABNORMAL LOW (ref 9–23)
BUN: 8 mg/dL (ref 6–24)
Bilirubin Total: 0.3 mg/dL (ref 0.0–1.2)
CO2: 20 mmol/L (ref 20–29)
Calcium: 8.8 mg/dL (ref 8.7–10.2)
Chloride: 103 mmol/L (ref 96–106)
Creatinine, Ser: 1 mg/dL (ref 0.57–1.00)
Globulin, Total: 2.5 g/dL (ref 1.5–4.5)
Glucose: 88 mg/dL (ref 70–99)
Potassium: 4.8 mmol/L (ref 3.5–5.2)
Sodium: 136 mmol/L (ref 134–144)
Total Protein: 6.3 g/dL (ref 6.0–8.5)
eGFR: 72 mL/min/{1.73_m2} (ref >59)

## 2024-01-12 LAB — LIPID PANEL W/O CHOL/HDL RATIO
Cholesterol, Total: 168 mg/dL (ref 100–199)
HDL: 48 mg/dL (ref >39)
LDL Chol Calc (NIH): 101 mg/dL — ABNORMAL HIGH (ref 0–99)
Triglycerides: 107 mg/dL (ref 0–149)
VLDL Cholesterol Cal: 19 mg/dL (ref 5–40)

## 2024-01-12 LAB — CBC WITH DIFFERENTIAL/PLATELET
Basophils Absolute: 0 10*3/uL (ref 0.0–0.2)
Basos: 1 %
EOS (ABSOLUTE): 0.1 10*3/uL (ref 0.0–0.4)
Eos: 1 %
Hematocrit: 35.6 % (ref 34.0–46.6)
Hemoglobin: 11.2 g/dL (ref 11.1–15.9)
Immature Grans (Abs): 0 10*3/uL (ref 0.0–0.1)
Immature Granulocytes: 0 %
Lymphocytes Absolute: 1.5 10*3/uL (ref 0.7–3.1)
Lymphs: 22 %
MCH: 26.4 pg — ABNORMAL LOW (ref 26.6–33.0)
MCHC: 31.5 g/dL (ref 31.5–35.7)
MCV: 84 fL (ref 79–97)
Monocytes Absolute: 0.5 10*3/uL (ref 0.1–0.9)
Monocytes: 7 %
Neutrophils Absolute: 4.6 10*3/uL (ref 1.4–7.0)
Neutrophils: 69 %
Platelets: 310 10*3/uL (ref 150–450)
RBC: 4.24 x10E6/uL (ref 3.77–5.28)
RDW: 15.2 % (ref 11.7–15.4)
WBC: 6.7 10*3/uL (ref 3.4–10.8)

## 2024-01-12 LAB — HEMOGLOBIN A1C
Est. average glucose Bld gHb Est-mCnc: 126 mg/dL
Hgb A1c MFr Bld: 6 % — ABNORMAL HIGH (ref 4.8–5.6)

## 2024-04-04 NOTE — Progress Notes (Signed)
 Rheumatology Follow Up Note  Chief Complaint  Patient presents with  . Rheumatoid arthritis involving multiple sites with positive      Subjective:HPI  Amber Price is a 43 y.o. female is here today for follow up of rheumatoid arthritis stage 2 moderate. The patient's allergies, current medications, past family history, past medical history, past social history, past surgical history and problem list were reviewed and updated as appropriate.   She is taking methotrexate and folic acid. She is tolerating the medications well. She has no pains or swelling of the joints today. She does get occasional stiffness but no major swelling. She has no fatigue, fever, infection, new skin rash, nasal/oral ulcer or dyspnea or dry cough. She has no hospitalizations since last evaluation.   Review of Systems:   Review of Systems  Constitutional:  Negative for fatigue.  HENT:  Negative for mouth sores and trouble swallowing.        Neg: Dry Mouth  Eyes:  Negative for redness.       Neg: Dry Eyes  Respiratory:  Negative for cough and shortness of breath.   Cardiovascular:  Negative for chest pain and leg swelling.  Gastrointestinal:  Negative for constipation, diarrhea and nausea.  Endocrine: Negative for cold intolerance and heat intolerance.  Genitourinary:  Negative for hematuria.  Musculoskeletal:        Per HPI  Skin:  Positive for rash. Negative for color change.  Neurological:  Negative for dizziness, weakness, numbness and headaches.  Hematological:  Does not bruise/bleed easily.  Psychiatric/Behavioral:  Negative for dysphoric mood and sleep disturbance. The patient is not nervous/anxious.   All other systems reviewed and are negative.  Objective:  Vitals:   04/04/24 1400  BP: (!) 151/85  Temp: 36.4 C (97.5 F)  TempSrc: Temporal  Weight: (!) 108 kg (238 lb)  Height: 175.3 cm (5' 9)  PainSc: 0-No pain   Length of Stiffness: 1 Hour   GEN - Pleasant, No Apparent Distress  HEENT  - normocephalic and atraumatic. Conjunctiva Clear.  No nasal/oral ulcers Neck - supple with no adenopathy or thyromegaly.   C spine with full range of motion. Heart - regular rate and rhythm, No murmurs/gallops/rub, Nml S1S2 Lungs - clear to auscultation in all fields. Extremities - there is no cyanosis or edema. Neurological - alert and oriented.  Spine - no paraspinal tenderness; no L spine or SI Joint Tenderness Skin - Base of scalp with silver, scaly rash improved; does have behind the ear and over the eyes  MSK - The following joints were examined bilaterally: Hands, Wrists, Elbows, Shoulders, Metatarsals, Ankels, Knees and Hips; they were normal apart from what is noted.    100% Fist Formation No Synovitis or Tenderness  No Dactylitis No Tender Point ______________________________________________________________________ Labs/Imaging Reviewed in EMR   Cr 1.0, Ast 14, ALT 09 CBC Hgb 11.2, Hct 35.6, Plt 310 Uric Acid 6.3 TSH 2.19 HgA1c 6.0 Esr 86 (H) --> 52 CRP 37 (H) --> 25 Pos: RF 25.9 (H); AntiCCP 250 (H), Anti-CEP-1 (48), Anti-Sa (62) Neg: ANA Direct, ANA IFA, 14.3.3 ETA Rheum Neg: Hep B, C and Quantiferon    CDAI is calculated as follows:   CDAI = SJC + TJC +PGA + EGA = 2 Where:   SJC = Swollen Joint Count TJC = Tender Joint Count PGA = Patient Global Assessment of Disease Activity EGA = Evaluator Global Assessment of Disease Activity   Interpretation <=2.8: Remission >2.8 and <=10: Low Disease Activity >10 and <=22:  Moderate Disease Activity >22: High Disease Activity     Assessment and Plan     Rheumatoid arthritis, stage 2 moderate: Stable  -- Diagnosed 05/2022; Positive RF, Synovitis and Tenderness on Exam, elevated ESR and CRP; father with history of rheumatoid arthritis -- Rash at base of the neck likely psoriasis, has resolved -- Continue Methotrexate 12.5 mg weekly and Folic Acid 1 mg Daily -- Can take Celebrex  as needed for pains  -- Check Labs    2. Vitamin D  Deficiency -- Continue with Vitamin D  supplementation   3. Long term use of immunosuppressive therapy -- Methotrexate is discussed at length including risks, benefits, adverse effects, direction on use and need for monitoring on a routine basis with office visits and labs.  Literature regarding methotrexate was provided to the patient.  Patient directed to call office if any adverse effect develops or if more questions arise.  In addition, issues addressed included mouth sores, skin rashes, gastrointestinal side effects, fever, pulmonary side effects, hair loss, and abnormal lab findings.  Discussed the use of folic acid with methotrexate. -- Patient has been educated on the importance of keeping follow up appointments for disease and medication monitoring.  -- Patient has been educated on the potential adverse events associated with prednisone including but not limited to elevated blood sugars, decrease bone density as well as glaucoma and mood affects.  -- Check labs   Diagnoses and all orders for this visit:  Rheumatoid arthritis involving multiple sites with positive rheumatoid factor (CMS/HHS-HCC) -     folic acid (FOLVITE) 1 MG tablet; Take 1 tablet (1 mg total) by mouth once daily -     Alanine Aminotransferase (ALT) -     Albumin -     Aspartate Aminotransferase (AST) -     CBC w/auto Differential (5 Part) -     Creatinine -     Sedimentation Rate-Westergren - [facility]  Long-term use of immunosuppressant medication -     Alanine Aminotransferase (ALT) -     Albumin -     Aspartate Aminotransferase (AST) -     CBC w/auto Differential (5 Part) -     Creatinine    Return in about 4 months (around 08/04/2024) for Routine Follow Up.   All new prescription medications, changes in current prescription dosages, and sample medications were discussed with the patient, including patient education, medication name, use, dosage, potential side effects, drug interactions,  consequences of not using/taking, and special instructions.  Patient expressed understanding.  No barriers to adherence.   I appreciate the opportunity to participate in the care of Karelyn Brisby. Please do not hesitate to contact me with any questions or concerns that may arise in regards to the patient's rheumatologic disease.    Attestation Statement:   I personally performed the service. (TP)  MAYUR LOREE BLANCH, MD

## 2024-04-06 ENCOUNTER — Other Ambulatory Visit: Payer: Self-pay

## 2024-04-06 ENCOUNTER — Emergency Department

## 2024-04-06 ENCOUNTER — Inpatient Hospital Stay
Admission: EM | Admit: 2024-04-06 | Discharge: 2024-04-08 | DRG: 872 | Disposition: A | Discharge status: Home / Self Care | Source: Ambulatory Visit | Attending: Internal Medicine | Admitting: Internal Medicine

## 2024-04-06 ENCOUNTER — Inpatient Hospital Stay

## 2024-04-06 ENCOUNTER — Inpatient Hospital Stay (HOSPITAL_COMMUNITY): Admit: 2024-04-06 | Discharge: 2024-04-06 | Disposition: A | Discharge status: Home / Self Care

## 2024-04-06 DIAGNOSIS — R7881 Bacteremia: Secondary | ICD-10-CM | POA: Diagnosis present

## 2024-04-06 DIAGNOSIS — Z6835 Body mass index (BMI) 35.0-35.9, adult: Secondary | ICD-10-CM | POA: Diagnosis not present

## 2024-04-06 DIAGNOSIS — Z79899 Other long term (current) drug therapy: Secondary | ICD-10-CM | POA: Diagnosis not present

## 2024-04-06 DIAGNOSIS — Z832 Family history of diseases of the blood and blood-forming organs and certain disorders involving the immune mechanism: Secondary | ICD-10-CM | POA: Diagnosis not present

## 2024-04-06 DIAGNOSIS — Z5986 Financial insecurity: Secondary | ICD-10-CM

## 2024-04-06 DIAGNOSIS — Z79631 Long term (current) use of antimetabolite agent: Secondary | ICD-10-CM | POA: Diagnosis not present

## 2024-04-06 DIAGNOSIS — C180 Malignant neoplasm of cecum: Secondary | ICD-10-CM | POA: Diagnosis present

## 2024-04-06 DIAGNOSIS — Z8249 Family history of ischemic heart disease and other diseases of the circulatory system: Secondary | ICD-10-CM

## 2024-04-06 DIAGNOSIS — E66812 Obesity, class 2: Secondary | ICD-10-CM | POA: Diagnosis present

## 2024-04-06 DIAGNOSIS — N12 Tubulo-interstitial nephritis, not specified as acute or chronic: Principal | ICD-10-CM | POA: Diagnosis present

## 2024-04-06 DIAGNOSIS — D849 Immunodeficiency, unspecified: Secondary | ICD-10-CM | POA: Diagnosis present

## 2024-04-06 DIAGNOSIS — I471 Supraventricular tachycardia, unspecified: Secondary | ICD-10-CM | POA: Diagnosis present

## 2024-04-06 DIAGNOSIS — Z87410 Personal history of cervical dysplasia: Secondary | ICD-10-CM

## 2024-04-06 DIAGNOSIS — E782 Mixed hyperlipidemia: Secondary | ICD-10-CM | POA: Diagnosis present

## 2024-04-06 DIAGNOSIS — M069 Rheumatoid arthritis, unspecified: Secondary | ICD-10-CM | POA: Diagnosis present

## 2024-04-06 DIAGNOSIS — K6389 Other specified diseases of intestine: Secondary | ICD-10-CM | POA: Diagnosis not present

## 2024-04-06 DIAGNOSIS — E559 Vitamin D deficiency, unspecified: Secondary | ICD-10-CM | POA: Diagnosis present

## 2024-04-06 DIAGNOSIS — Z5941 Food insecurity: Secondary | ICD-10-CM

## 2024-04-06 DIAGNOSIS — R008 Other abnormalities of heart beat: Secondary | ICD-10-CM | POA: Diagnosis not present

## 2024-04-06 DIAGNOSIS — Z833 Family history of diabetes mellitus: Secondary | ICD-10-CM

## 2024-04-06 DIAGNOSIS — R7303 Prediabetes: Secondary | ICD-10-CM | POA: Diagnosis present

## 2024-04-06 DIAGNOSIS — C787 Secondary malignant neoplasm of liver and intrahepatic bile duct: Secondary | ICD-10-CM | POA: Diagnosis present

## 2024-04-06 DIAGNOSIS — R591 Generalized enlarged lymph nodes: Secondary | ICD-10-CM | POA: Diagnosis present

## 2024-04-06 DIAGNOSIS — K769 Liver disease, unspecified: Secondary | ICD-10-CM | POA: Diagnosis not present

## 2024-04-06 DIAGNOSIS — M19011 Primary osteoarthritis, right shoulder: Secondary | ICD-10-CM | POA: Diagnosis present

## 2024-04-06 DIAGNOSIS — M05712 Rheumatoid arthritis with rheumatoid factor of left shoulder without organ or systems involvement: Secondary | ICD-10-CM | POA: Diagnosis present

## 2024-04-06 DIAGNOSIS — I10 Essential (primary) hypertension: Secondary | ICD-10-CM | POA: Diagnosis present

## 2024-04-06 DIAGNOSIS — D573 Sickle-cell trait: Secondary | ICD-10-CM | POA: Diagnosis present

## 2024-04-06 DIAGNOSIS — Z8481 Family history of carrier of genetic disease: Secondary | ICD-10-CM | POA: Diagnosis not present

## 2024-04-06 DIAGNOSIS — C801 Malignant (primary) neoplasm, unspecified: Secondary | ICD-10-CM | POA: Diagnosis present

## 2024-04-06 DIAGNOSIS — N3000 Acute cystitis without hematuria: Secondary | ICD-10-CM | POA: Diagnosis not present

## 2024-04-06 DIAGNOSIS — D509 Iron deficiency anemia, unspecified: Secondary | ICD-10-CM | POA: Diagnosis present

## 2024-04-06 DIAGNOSIS — A4151 Sepsis due to Escherichia coli [E. coli]: Principal | ICD-10-CM | POA: Diagnosis present

## 2024-04-06 DIAGNOSIS — K37 Unspecified appendicitis: Secondary | ICD-10-CM | POA: Diagnosis present

## 2024-04-06 DIAGNOSIS — Z87440 Personal history of urinary (tract) infections: Secondary | ICD-10-CM

## 2024-04-06 DIAGNOSIS — Z83438 Family history of other disorder of lipoprotein metabolism and other lipidemia: Secondary | ICD-10-CM | POA: Diagnosis not present

## 2024-04-06 DIAGNOSIS — R933 Abnormal findings on diagnostic imaging of other parts of digestive tract: Secondary | ICD-10-CM | POA: Diagnosis not present

## 2024-04-06 DIAGNOSIS — K36 Other appendicitis: Secondary | ICD-10-CM

## 2024-04-06 DIAGNOSIS — K5669 Other partial intestinal obstruction: Secondary | ICD-10-CM | POA: Diagnosis not present

## 2024-04-06 DIAGNOSIS — M05711 Rheumatoid arthritis with rheumatoid factor of right shoulder without organ or systems involvement: Secondary | ICD-10-CM | POA: Diagnosis present

## 2024-04-06 DIAGNOSIS — M19012 Primary osteoarthritis, left shoulder: Secondary | ICD-10-CM | POA: Diagnosis present

## 2024-04-06 DIAGNOSIS — R5383 Other fatigue: Secondary | ICD-10-CM | POA: Diagnosis present

## 2024-04-06 DIAGNOSIS — D490 Neoplasm of unspecified behavior of digestive system: Secondary | ICD-10-CM

## 2024-04-06 DIAGNOSIS — N390 Urinary tract infection, site not specified: Secondary | ICD-10-CM | POA: Diagnosis present

## 2024-04-06 DIAGNOSIS — N951 Menopausal and female climacteric states: Secondary | ICD-10-CM | POA: Diagnosis present

## 2024-04-06 HISTORY — DX: Essential (primary) hypertension: I10

## 2024-04-06 LAB — LACTIC ACID, PLASMA
Lactic Acid, Venous: 1 mmol/L (ref 0.5–1.9)
Lactic Acid, Venous: 1.2 mmol/L (ref 0.5–1.9)
Lactic Acid, Venous: 1.1 mmol/L (ref 0.5–1.9)
Lactic Acid, Venous: 1.3 mmol/L (ref 0.5–1.9)

## 2024-04-06 LAB — CBC
HCT: 35.3 % — ABNORMAL LOW (ref 36.0–46.0)
Hemoglobin: 11.3 g/dL — ABNORMAL LOW (ref 12.0–15.0)
MCH: 25.1 pg — ABNORMAL LOW (ref 26.0–34.0)
MCHC: 32 g/dL (ref 30.0–36.0)
MCV: 78.4 fL — ABNORMAL LOW (ref 80.0–100.0)
Platelets: 316 K/uL (ref 150–400)
RBC: 4.5 MIL/uL (ref 3.87–5.11)
RDW: 14.9 % (ref 11.5–15.5)
WBC: 14.4 K/uL — ABNORMAL HIGH (ref 4.0–10.5)
nRBC: 0 % (ref 0.0–0.2)

## 2024-04-06 LAB — URINALYSIS, ROUTINE W REFLEX MICROSCOPIC
Bilirubin Urine: NEGATIVE
Glucose, UA: NEGATIVE mg/dL
Ketones, ur: NEGATIVE mg/dL
Nitrite: POSITIVE — AB
Protein, ur: 30 mg/dL — AB
Specific Gravity, Urine: 1.006 (ref 1.005–1.030)
WBC, UA: >50 WBC/hpf (ref 0–5)
pH: 6 (ref 5.0–8.0)

## 2024-04-06 LAB — CBC WITH DIFFERENTIAL/PLATELET
Abs Immature Granulocytes: 0.07 K/uL (ref 0.00–0.07)
Basophils Absolute: 0 K/uL (ref 0.0–0.1)
Basophils Relative: 0 %
Eosinophils Absolute: 0 K/uL (ref 0.0–0.5)
Eosinophils Relative: 0 %
HCT: 35.7 % — ABNORMAL LOW (ref 36.0–46.0)
Hemoglobin: 11.8 g/dL — ABNORMAL LOW (ref 12.0–15.0)
Immature Granulocytes: 1 %
Lymphocytes Relative: 6 %
Lymphs Abs: 0.9 K/uL (ref 0.7–4.0)
MCH: 25.6 pg — ABNORMAL LOW (ref 26.0–34.0)
MCHC: 33.1 g/dL (ref 30.0–36.0)
MCV: 77.4 fL — ABNORMAL LOW (ref 80.0–100.0)
Monocytes Absolute: 0.5 K/uL (ref 0.1–1.0)
Monocytes Relative: 3 %
Neutro Abs: 13.4 K/uL — ABNORMAL HIGH (ref 1.7–7.7)
Neutrophils Relative %: 90 %
Platelets: 316 K/uL (ref 150–400)
RBC: 4.61 MIL/uL (ref 3.87–5.11)
RDW: 15.1 % (ref 11.5–15.5)
WBC: 14.9 K/uL — ABNORMAL HIGH (ref 4.0–10.5)
nRBC: 0 % (ref 0.0–0.2)

## 2024-04-06 LAB — TROPONIN I (HIGH SENSITIVITY)
Troponin I (High Sensitivity): 3 ng/L (ref <18)
Troponin I (High Sensitivity): 4 ng/L (ref <18)
Troponin I (High Sensitivity): 8 ng/L (ref <18)

## 2024-04-06 LAB — COMPREHENSIVE METABOLIC PANEL WITH GFR
ALT: 13 U/L (ref 0–44)
ALT: 12 U/L (ref 0–44)
AST: 16 U/L (ref 15–41)
AST: 18 U/L (ref 15–41)
Albumin: 3.3 g/dL — ABNORMAL LOW (ref 3.5–5.0)
Albumin: 3.1 g/dL — ABNORMAL LOW (ref 3.5–5.0)
Alkaline Phosphatase: 56 U/L (ref 38–126)
Alkaline Phosphatase: 54 U/L (ref 38–126)
Anion gap: 11 (ref 5–15)
Anion gap: 12 (ref 5–15)
BUN: 9 mg/dL (ref 6–20)
BUN: 7 mg/dL (ref 6–20)
CO2: 22 mmol/L (ref 22–32)
CO2: 23 mmol/L (ref 22–32)
Calcium: 8.8 mg/dL — ABNORMAL LOW (ref 8.9–10.3)
Calcium: 8.6 mg/dL — ABNORMAL LOW (ref 8.9–10.3)
Chloride: 103 mmol/L (ref 98–111)
Chloride: 103 mmol/L (ref 98–111)
Creatinine, Ser: 1.11 mg/dL — ABNORMAL HIGH (ref 0.44–1.00)
Creatinine, Ser: 1.03 mg/dL — ABNORMAL HIGH (ref 0.44–1.00)
GFR, Estimated: >60 mL/min (ref >60)
GFR, Estimated: >60 mL/min (ref >60)
Glucose, Bld: 103 mg/dL — ABNORMAL HIGH (ref 70–99)
Glucose, Bld: 105 mg/dL — ABNORMAL HIGH (ref 70–99)
Potassium: 3.5 mmol/L (ref 3.5–5.1)
Potassium: 3.8 mmol/L (ref 3.5–5.1)
Sodium: 136 mmol/L (ref 135–145)
Sodium: 138 mmol/L (ref 135–145)
Total Bilirubin: 0.9 mg/dL (ref 0.0–1.2)
Total Bilirubin: 1.1 mg/dL (ref 0.0–1.2)
Total Protein: 7.3 g/dL (ref 6.5–8.1)
Total Protein: 7.3 g/dL (ref 6.5–8.1)

## 2024-04-06 LAB — LIPASE, BLOOD: Lipase: 14 U/L (ref 11–51)

## 2024-04-06 LAB — PROTIME-INR
INR: 1 (ref 0.8–1.2)
Prothrombin Time: 13.7 s (ref 11.4–15.2)

## 2024-04-06 LAB — POC URINE PREG, ED: Preg Test, Ur: NEGATIVE

## 2024-04-06 LAB — APTT: aPTT: 30 s (ref 24–36)

## 2024-04-06 MED ORDER — SODIUM CHLORIDE 0.9 % IV SOLN
1.0000 g | Freq: Once | INTRAVENOUS | Status: AC
  Administered 2024-04-06: 1 g via INTRAVENOUS
  Filled 2024-04-06: qty 10

## 2024-04-06 MED ORDER — SODIUM CHLORIDE 0.9 % IV SOLN
2.0000 g | Freq: Two times a day (BID) | INTRAVENOUS | Status: DC
  Administered 2024-04-06: 2 g via INTRAVENOUS
  Filled 2024-04-06 (×2): qty 12.5

## 2024-04-06 MED ORDER — SODIUM CHLORIDE 0.9 % IV BOLUS
1000.0000 mL | Freq: Once | INTRAVENOUS | Status: AC
  Administered 2024-04-06: 1000 mL via INTRAVENOUS

## 2024-04-06 MED ORDER — SODIUM CHLORIDE 0.9 % IV SOLN
Freq: Once | INTRAVENOUS | Status: AC
Start: 2024-04-06 — End: 2024-04-06

## 2024-04-06 MED ORDER — SODIUM CHLORIDE 0.9 % IV SOLN: 1.0000 g | INTRAVENOUS | Status: DC

## 2024-04-06 MED ORDER — ONDANSETRON HCL 4 MG PO TABS: 4.0000 mg | ORAL_TABLET | Freq: Four times a day (QID) | ORAL | Status: DC | PRN

## 2024-04-06 MED ORDER — IOHEXOL 350 MG/ML SOLN
75.0000 mL | Freq: Once | INTRAVENOUS | Status: AC | PRN
Start: 2024-04-06 — End: 2024-04-06
  Administered 2024-04-06: 75 mL via INTRAVENOUS

## 2024-04-06 MED ORDER — MORPHINE SULFATE (PF) 2 MG/ML IV SOLN
2.0000 mg | INTRAVENOUS | Status: DC | PRN
  Filled 2024-04-06: qty 1

## 2024-04-06 MED ORDER — IOHEXOL 350 MG/ML SOLN: 100.0000 mL | Freq: Once | INTRAVENOUS | Status: DC | PRN

## 2024-04-06 MED ORDER — SODIUM CHLORIDE 0.9 % IV SOLN: INTRAVENOUS | Status: DC

## 2024-04-06 MED ORDER — ACETAMINOPHEN 650 MG RE SUPP: 650.0000 mg | Freq: Four times a day (QID) | RECTAL | Status: DC | PRN

## 2024-04-06 MED ORDER — CEFEPIME HCL 2 G IV SOLR: 2.0000 g | Freq: Once | INTRAVENOUS | Status: DC

## 2024-04-06 MED ORDER — ONDANSETRON HCL 4 MG/2ML IJ SOLN: 4.0000 mg | Freq: Four times a day (QID) | INTRAMUSCULAR | Status: DC | PRN

## 2024-04-06 MED ORDER — SENNOSIDES-DOCUSATE SODIUM 8.6-50 MG PO TABS: 1.0000 | ORAL_TABLET | Freq: Every evening | ORAL | Status: DC | PRN

## 2024-04-06 MED ORDER — ENOXAPARIN SODIUM 60 MG/0.6ML IJ SOSY
55.0000 mg | PREFILLED_SYRINGE | INTRAMUSCULAR | Status: DC
  Administered 2024-04-06 – 2024-04-07 (×2): 55 mg via SUBCUTANEOUS
  Filled 2024-04-06 (×2): qty 0.6

## 2024-04-06 MED ORDER — MORPHINE SULFATE (PF) 2 MG/ML IV SOLN: 2.0000 mg | INTRAVENOUS | Status: DC | PRN

## 2024-04-06 MED ORDER — IOHEXOL 300 MG/ML  SOLN
100.0000 mL | Freq: Once | INTRAMUSCULAR | Status: AC | PRN
  Administered 2024-04-06: 100 mL via INTRAVENOUS

## 2024-04-06 MED ORDER — HYDROCODONE-ACETAMINOPHEN 5-325 MG PO TABS: 1.0000 | ORAL_TABLET | ORAL | Status: DC | PRN

## 2024-04-06 MED ORDER — METOPROLOL TARTRATE 25 MG PO TABS
12.5000 mg | ORAL_TABLET | Freq: Two times a day (BID) | ORAL | Status: DC
  Administered 2024-04-06 – 2024-04-08 (×4): 12.5 mg via ORAL
  Filled 2024-04-06 (×4): qty 1

## 2024-04-06 MED ORDER — ACETAMINOPHEN 325 MG PO TABS: 650.0000 mg | ORAL_TABLET | Freq: Four times a day (QID) | ORAL | Status: DC | PRN

## 2024-04-06 MED ORDER — LABETALOL HCL 5 MG/ML IV SOLN
10.0000 mg | Freq: Once | INTRAVENOUS | Status: DC
  Filled 2024-04-06: qty 4

## 2024-04-06 NOTE — Progress Notes (Signed)
 PHARMACIST - PHYSICIAN COMMUNICATION  CONCERNING:  Enoxaparin  (Lovenox ) for DVT Prophylaxis    RECOMMENDATION: Patient was prescribed enoxaprin 40mg  q24 hours for VTE prophylaxis.   Filed Weights   04/06/24 1102  Weight: 108 kg (238 lb)    Body mass index is 35.15 kg/m.  Estimated Creatinine Clearance: 85.5 mL/min (A) (by C-G formula based on SCr of 1.11 mg/dL (H)).   Based on [facility] policy patient is candidate for enoxaparin  0.5mg /kg TBW SQ every 24 hours based on BMI being >30.  DESCRIPTION: Pharmacy has adjusted enoxaparin  dose per [facility] policy.  Patient is now receiving enoxaparin  0.5 mg/kg every 24 hours   Rankin CANDIE Dills, PharmD, [facility] 04/06/2024 2:50 PM

## 2024-04-06 NOTE — Sepsis Progress Note (Signed)
 Sepsis protocol monitored by eLink

## 2024-04-06 NOTE — ED Triage Notes (Signed)
 Pt to ED for L sided sharp non radiating chest pain intermittent since 3 days. HR 142 in triage. Denies dizziness, SOB. Hx sickle cell triat.

## 2024-04-06 NOTE — ED Notes (Signed)
 Patient transported to CT

## 2024-04-06 NOTE — Plan of Care (Signed)
  Problem: Fluid Volume: Goal: Hemodynamic stability will improve Outcome: Progressing   Problem: Clinical Measurements: Goal: Diagnostic test results will improve Outcome: Progressing   Problem: Respiratory: Goal: Ability to maintain adequate ventilation will improve Outcome: Progressing   Problem: Education: Goal: Knowledge of General Education information will improve Description: Including pain rating scale, medication(s)/side effects and non-pharmacologic comfort measures Outcome: Progressing   Problem: Clinical Measurements: Goal: Ability to maintain clinical measurements within normal limits will improve Outcome: Progressing   Problem: Activity: Goal: Risk for activity intolerance will decrease Outcome: Progressing   Problem: Nutrition: Goal: Adequate nutrition will be maintained Outcome: Progressing   Problem: Coping: Goal: Level of anxiety will decrease Outcome: Progressing   Problem: Elimination: Goal: Will not experience complications related to bowel motility Outcome: Progressing   Problem: Pain Managment: Goal: General experience of comfort will improve and/or be controlled Outcome: Progressing   Problem: Safety: Goal: Ability to remain free from injury will improve Outcome: Progressing   Problem: Skin Integrity: Goal: Risk for impaired skin integrity will decrease Outcome: Progressing

## 2024-04-06 NOTE — H&P (Addendum)
 History and Physical    Amber Price FMW:[id number] DOB: [birth year] DOA: 04/06/2024  PCP: Fernand Fredy RAMAN, MD Patient coming from: Home  Chief Complaint: dysuria and back pain  HPI: Amber Price is a 43 y.o. female with medical history significant of prediabetes, rheumatoid arthritis treated with methotrexate, anemia, hypertension and vitamin D  deficiency who presented to the hospital with complaint of urinary frequency and dysuria as well as back pain.  Patient states that her symptoms have been persistent for the last 2 to 3 days.  When she went to the restroom she noticed a malodorous smell and she bought over-the-counter Azo to help with this.  She says she took the medication but symptoms did not improve and therefore she presented to the hospital for further evaluation and treatment today.  The patient reported a dull chest ache which intermittent in nature. She states that she is doing well but it comes and goes and started with this dysuria, frequency.    ED Course:  In the ER, BP 150/90, HR 136, RR 16, O2 saturation 98% on RA,  and Tmax 98.7. Cbc demonstrated wbc 14.4,  hb/hct 11.3/35.3, and platelet 316. Chemistry demonstrated Na 136, K 3.5, Cl 103, bicarb 22, Bun/Cr  9/1.11 and glucose 103.  Initial troponin was 3.  CXR demonstrated no acute cardiopulmonary findings. Urinalysis demonstrated moderate LE and positive nitrites with many bacteria. EKG findings consistent with supraventricular tachycardia HR 138 and QT prolongation of 508. Ct abdomen demonstrated asymmetric wall thickening and enhancement in the cecum, measuring 4.3 cm in craniocaudal dimension, and involving the appendiceal orifice, worrisome for underlying colonic neoplasm. Likely causing obstruction of the appendix, which is fluid filled an dilated appendix, suggesting acute appendicitis. There are 3 hypodense lesions in both lobes of the liver, measuring up to 1.8 cm, likely metastatic disease. A couple of mildly prominent  right ileocolic chain lymph nodes are present measuring up to 7 mm (axial 50-52). These could be reactive or metastatic.  Review of Systems:  All systems reviewed and apart from history of presenting illness, are negative.  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 RECK 2018 ANNUAL   Sickle cell trait    Vitamin D  deficiency 01/2016   ADD VIT D3 5000 IU DAILY    Past Surgical History:  Procedure Laterality Date   NO PAST SURGERIES       reports that she has never smoked. She has never used smokeless tobacco. She reports that she does not currently use alcohol. She reports that she does not use drugs.  No Known Allergies  Family History  Problem Relation Age of Onset   Diabetes Mellitus II Mother    Hypertension Mother    Hypercholesterolemia Father    Cancer Paternal Grandmother 56       LUNG   Sickle cell trait Son     Prior to Admission medications   Medication Sig Start Date End Date Taking? Authorizing Provider  folic acid (FOLVITE) 1 MG tablet Take 1 mg by mouth daily. 04/04/24 04/04/25 Yes [provider]  amLODipine  (NORVASC ) 5 MG tablet Take 1 tablet (5 mg total) by  mouth daily. 07/02/23 07/01/24  Fernand Fredy RAMAN, MD  celecoxib  (CELEBREX ) 200 MG capsule Take 1 capsule (200 mg total) by mouth daily. Patient not taking: Reported on 01/11/2024 08/20/23   Fernand Fredy RAMAN, MD  Cholecalciferol (VITAMIN D3) 1.25 MG (50000 UT) CAPS TAKE 1 CAPSULE BY MOUTH ONE TIME PER WEEK 08/27/23   Fernand Fredy RAMAN, MD  JUNEL  FE 1/20 1-20 MG-MCG tablet TAKE 1 TABLET BY MOUTH EVERY DAY 11/25/23   Orlean Alan HERO, FNP  methotrexate (RHEUMATREX) 2.5 MG tablet Take 2.5 mg by mouth once a week.    [provider]  rosuvastatin   (CRESTOR ) 20 MG tablet TAKE 1 TABLET BY MOUTH EVERY DAY 11/20/23   Orlean Alan HERO, FNP  valACYclovir  (VALTREX ) 500 MG tablet Take 1 tablet (500 mg total) by mouth daily. 01/11/24   Fernand Fredy RAMAN, MD    Physical Exam: Vitals:   04/06/24 1130 04/06/24 1254 04/06/24 1300 04/06/24 1330  BP:  (!) 149/90 (!) 146/85 (!) 147/83  Pulse: (!) 124 (!) 121 (!) 120 (!) 116  Resp: (!) 25 16 20  (!) 24  Temp:      TempSrc:      SpO2: 100% 100% 100% 100%  Weight:      Height:        Physical Exam Constitutional:      General: He is not in acute distress.    Appearance: Normal appearance.  HENT:     Head: Normocephalic and atraumatic.  Eyes:     Extraocular Movements: Extraocular movements intact.     Conjunctiva/sclera: Conjunctivae normal.     Pupils: Pupils are equal, round, and reactive to light.  Cardiovascular:     Rate and Rhythm: tachycardia.     Pulses: Normal pulses.     Heart sounds: Normal heart sounds.  Pulmonary:     Effort: Pulmonary effort is normal. No respiratory distress.     Breath sounds: Normal breath sounds. No wheezing, rhonchi or rales.  Abdominal:     General: Abdomen is flat. Bowel sounds are normal. There is no distension.     Palpations: Abdomen is soft.     Tenderness: There is no abdominal tenderness.  Musculoskeletal:        General: No deformity. Normal range of motion.  Skin:    General: Skin is warm and dry.     Coloration: Skin is not jaundiced.  Neurological:     General: No focal deficit present.     Mental Status: He is alert and oriented to person, place, and time. Mental status is at baseline.   Labs on Admission: I have personally reviewed following labs and imaging studies  CBC: Recent Labs  Lab 04/06/24 1124  WBC 14.4*  HGB 11.3*  HCT 35.3*  MCV 78.4*  PLT 316   Basic Metabolic Panel: Recent Labs  Lab 04/06/24 1124  NA 136  K 3.5  CL 103  CO2 22  GLUCOSE 103*  BUN 9  CREATININE 1.11*  CALCIUM  8.8*   GFR: Estimated  Creatinine Clearance: 85.5 mL/min (A) (by C-G formula based on SCr of 1.11 mg/dL (H)). Liver Function Tests: Recent Labs  Lab 04/06/24 1124  AST 16  ALT 13  ALKPHOS 56  BILITOT 0.9  PROT 7.3  ALBUMIN 3.3*   Recent Labs  Lab 04/06/24 1124  LIPASE 14   No results for input(s): AMMONIA  in the last 168 hours. Coagulation Profile: No results for input(s): INR, PROTIME in the last 168 hours. Cardiac Enzymes: No  results for input(s): CKTOTAL, CKMB, CKMBINDEX, TROPONINI in the last 168 hours. BNP (last 3 results) No results for input(s): PROBNP in the last 8760 hours. HbA1C: No results for input(s): HGBA1C in the last 72 hours. CBG: No results for input(s): GLUCAP in the last 168 hours. Lipid Profile: No results for input(s): CHOL, HDL, LDLCALC, TRIG, CHOLHDL, LDLDIRECT in the last 72 hours. Thyroid  Function Tests: No results for input(s): TSH, T4TOTAL, FREET4, T3FREE, THYROIDAB in the last 72 hours. Anemia Panel: No results for input(s): VITAMINB12, FOLATE, FERRITIN, TIBC, IRON, RETICCTPCT in the last 72 hours. Urine analysis:    Component Value Date/Time   COLORURINE YELLOW (A) 04/06/2024 1124   APPEARANCEUR HAZY (A) 04/06/2024 1124   LABSPEC 1.006 04/06/2024 1124   PHURINE 6.0 04/06/2024 1124   GLUCOSEU NEGATIVE 04/06/2024 1124   HGBUR MODERATE (A) 04/06/2024 1124   BILIRUBINUR NEGATIVE 04/06/2024 1124   BILIRUBINUR negative 08/19/2022 1121   KETONESUR NEGATIVE 04/06/2024 1124   PROTEINUR 30 (A) 04/06/2024 1124   UROBILINOGEN 0.2 08/19/2022 1121   NITRITE POSITIVE (A) 04/06/2024 1124   LEUKOCYTESUR MODERATE (A) 04/06/2024 1124    Radiological Exams on Admission: DG Chest Port 1 View Result Date: 04/06/2024 EXAM: 1 VIEW(S) XRAY OF THE CHEST 04/06/2024 11:36:23 AM COMPARISON: None available. CLINICAL HISTORY: chest pain. Chest pain FINDINGS: LUNGS AND PLEURA: No focal pulmonary opacity. No pulmonary edema. No pleural  effusion. No pneumothorax. HEART AND MEDIASTINUM: No acute abnormality of the cardiac and mediastinal silhouettes. BONES AND SOFT TISSUES: No acute osseous abnormality. IMPRESSION: 1. Normal chest radiograph. Electronically signed by: Lonni Necessary MD 04/06/2024 12:35 PM EDT RP Workstation: [id number]    EKG: Independently reviewed.   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 Total she should  have 3.2 L bolus  Will obtain TSH for further evaluation Also possible this is in the setting of obstruction and she requires more fluids  CTA chest for further evaluation as well in view of colonic mass   Appendicitis??? Patient did not have rebound tenderness on exam Though imaging did demonstrate possible acute appendicitis General surgery has been consulted to see the patient  Colon mass with possible liver mets This is a new finding on CT abdomen It is possible that the mass is obstructing the appendix Patient is on IV antibiotics  General surgery has consulted to see the patient Oncology has also been consulted to see the patient   Hypertension She normally takes amlodipine  at home Consider prn  labetalol  especially given npo status   Prediabetes Will hold off on SSI Will monitor at this time   DVT prophylaxis: lovenox  Code Status: Full  Family Communication: no  Disposition Plan: Status is: Inpatient Remains inpatient appropriate because: ongoing  sinus tachycardia and possible surgical intervention    Consults called: general surgery and oncology  Admission status: inpatient  Level of care: Level of care: Telemetry Medical The medical decision making on this patient was of high complexity and the patient is at high risk for clinical deterioration, therefore this is a level 3 visit.  Bradly MARLA Drones MD [facility]  If 7PM-7AM, please contact night-coverage [email or web address]  04/06/2024, 2:49 PM

## 2024-04-06 NOTE — Progress Notes (Signed)
 Patient is off the floor, having chest ct

## 2024-04-06 NOTE — Consult Note (Signed)
 [facility] SURGICAL CONSULTATION NOTE (initial) - cpt: 99255   HISTORY OF PRESENT ILLNESS (HPI):  43 y.o. female presented to [facility] ED today for evaluation of nausea, associated headache and waxing and waning chest pain.  She denies cough, shortness of breath or dyspnea. Patient reports some subjective chills, mild hot flashes.  Her recorded home temperature 99.5.  She did admit to dysuria and urinary frequency with a known history of urinary tract infections.  She also reported malodor of her urine.  She does have some mild back pain.  Surgery is consulted by ED physician Dr. Arlander in this context for evaluation and management of CT findings as noted below for a cecal process that appears to be involving/causing appendiceal dilatation or secondary appendicitis.  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 RECK 2018 ANNUAL   Sickle cell trait    Vitamin D  deficiency 01/2016   ADD VIT D3 5000 IU DAILY     PAST SURGICAL HISTORY (PSH):  Past Surgical History:  Procedure Laterality Date   NO PAST SURGERIES       MEDICATIONS:  Prior to Admission medications   Medication Sig Start Date End Date Taking? Authorizing Provider  folic acid (FOLVITE) 1 MG tablet Take 1 mg by mouth daily. 04/04/24 04/04/25 Yes [provider]  amLODipine  (NORVASC ) 5 MG tablet Take 1 tablet (5 mg total) by mouth daily. 07/02/23 07/01/24  Fernand Fredy RAMAN, MD  celecoxib  (CELEBREX ) 200 MG capsule Take 1 capsule (200 mg total) by mouth daily. Patient not taking: Reported on 01/11/2024 08/20/23   Fernand Fredy RAMAN, MD  Cholecalciferol (VITAMIN D3) 1.25 MG (50000 UT) CAPS TAKE 1 CAPSULE BY MOUTH ONE TIME PER WEEK 08/27/23    Fernand Fredy RAMAN, MD  JUNEL  FE 1/20 1-20 MG-MCG tablet TAKE 1 TABLET BY MOUTH EVERY DAY 11/25/23   Orlean Alan HERO, FNP  methotrexate (RHEUMATREX) 2.5 MG tablet Take 2.5 mg by mouth once a week.    [provider]  rosuvastatin  (CRESTOR ) 20 MG tablet TAKE 1 TABLET BY MOUTH EVERY DAY 11/20/23   Orlean Alan HERO, FNP  valACYclovir  (VALTREX ) 500 MG tablet Take 1 tablet (500 mg total) by mouth daily. 01/11/24   Fernand Fredy RAMAN, MD     ALLERGIES:  No Known Allergies   SOCIAL HISTORY:  Social History   Socioeconomic History   Marital status: Single    Spouse name: Not on file   Number of children: 3   Years of education: 14   Highest education level: Not on file  Occupational History   Occupation: BUSINESS OFFICE SPECIALIST  Tobacco Use   Smoking status: Never   Smokeless tobacco: Never  Vaping Use   Vaping status: Never Used  Substance and Sexual Activity   Alcohol use: Not Currently   Drug use: No   Sexual activity: Yes    Birth control/protection: Pill  Other Topics Concern   Not on file  Social History Narrative   Not on file   Social Drivers of Health   Financial Resource Strain: Not on file  Food Insecurity: Not on file  Transportation Needs: Not on file  Physical Activity: Not on file  Stress: Not on file  Social Connections: Not on file  Intimate Partner Violence: Not on file     FAMILY HISTORY:  Family History  Problem Relation Age of Onset   Diabetes Mellitus II Mother    Hypertension Mother    Hypercholesterolemia Father    Cancer Paternal Grandmother 19       LUNG   Sickle cell trait Son       REVIEW OF SYSTEMS:  Review of Systems  Constitutional:  Negative for weight loss.  HENT: Negative.    Eyes: Negative.   Respiratory:  Negative for cough, shortness of breath and wheezing.   Cardiovascular:  Positive for chest pain.  Gastrointestinal:  Positive for nausea. Negative for abdominal pain, blood in stool, constipation, diarrhea, melena and  vomiting.  Genitourinary:  Positive for dysuria, frequency and urgency.  Skin: Negative.   Neurological: Negative.   Psychiatric/Behavioral: Negative.      VITAL SIGNS:  Temp:  [98.7 F (37.1 C)] 98.7 F (37.1 C) (09/24 1101) Pulse Rate:  [116-146] 136 (09/24 1535) Resp:  [16-28] 16 (09/24 1535) BP: (97-150)/(72-97) 150/90 (09/24 1535) SpO2:  [98 %-100 %] 98 % (09/24 1535) Weight:  [891 kg] 108 kg (09/24 1102)     Height: 5' 9 (175.3 cm) Weight: 108 kg BMI (Calculated): 35.13   INTAKE/OUTPUT:  No intake/output data recorded.  PHYSICAL EXAM:  Physical Exam Blood pressure (!) 150/90, pulse (!) 136, temperature 98.7 F (37.1 C), temperature source Oral, resp. rate 16, height 5' 9 (1.753 m), weight 108 kg, last menstrual period 04/04/2024, SpO2 98%. Last Weight  Most recent update: 04/06/2024 11:02 AM    Weight  108 kg (238 lb)             CONSTITUTIONAL: Well developed, and nourished, appropriately responsive and aware without distress.   EYES: Sclera non-icteric.   EARS, NOSE, MOUTH AND THROAT:  The oropharynx is clear. Oral mucosa is pink and moist.    Hearing is intact to voice.  NECK: Trachea is midline, and there is no jugular venous distension.  LYMPH NODES:  Lymph nodes in the neck are not enlarged. RESPIRATORY:  Lungs are clear, and breath sounds are equal bilaterally.   Normal respiratory effort without pathologic use of accessory muscles. CARDIOVASCULAR: Heart is tachycardic.   Well perfused.  GI: The abdomen is  soft, nontender, and nondistended. There were no palpable masses. I did not appreciate hepatosplenomegaly. There were normal bowel sounds. MUSCULOSKELETAL:  Symmetrical muscle tone appreciated in all four extremities. Warm without edema.  SKIN: Skin turgor is normal. No pathologic skin lesions appreciated.  NEUROLOGIC:  Motor and sensation appear grossly normal.  Cranial nerves are grossly without defect. PSYCH:  Alert and oriented to person, place and  time. Affect is appropriate for situation.  Data Reviewed I have personally reviewed what is currently available of the patient's imaging, recent labs and medical records.    Labs:     Latest Ref Rng & Units 04/06/2024    3:41 PM 04/06/2024   11:24 AM 01/11/2024   10:18 AM  CBC  WBC 4.0 - 10.5 K/uL 14.9  14.4  6.7   Hemoglobin 12.0 - 15.0 g/dL 88.1  88.6  88.7   Hematocrit 36.0 - 46.0 % 35.7  35.3  35.6   Platelets 150 - 400 K/uL 316  316  310       Latest Ref Rng & Units 04/06/2024  3:41 PM 04/06/2024   11:24 AM 01/11/2024   10:18 AM  CMP  Glucose 70 - 99 mg/dL 894  896  88   BUN 6 - 20 mg/dL 7  9  8    Creatinine 0.44 - 1.00 mg/dL 8.96  8.88  8.99   Sodium 135 - 145 mmol/L 138  136  136   Potassium 3.5 - 5.1 mmol/L 3.8  3.5  4.8   Chloride 98 - 111 mmol/L 103  103  103   CO2 22 - 32 mmol/L 23  22  20    Calcium  8.9 - 10.3 mg/dL 8.6  8.8  8.8   Total Protein 6.5 - 8.1 g/dL 7.3  7.3  6.3   Total Bilirubin 0.0 - 1.2 mg/dL 1.1  0.9  0.3   Alkaline Phos 38 - 126 U/L 54  56  62   AST 15 - 41 U/L 18  16  14    ALT 0 - 44 U/L 12  13  9       Imaging studies:   Last 24 hrs: CT ABDOMEN PELVIS W CONTRAST Result Date: 04/06/2024 CLINICAL DATA:  Acute aortic syndrome (AAS) suspected, left-sided, sharp, nonradiating chest pain. EXAM: CT ABDOMEN AND PELVIS WITH CONTRAST TECHNIQUE: Multidetector CT imaging of the abdomen and pelvis was performed using the standard protocol following bolus administration of intravenous contrast. RADIATION DOSE REDUCTION: This exam was performed according to the departmental dose-optimization program which includes automated exposure control, adjustment of the mA and/or kV according to patient size and/or use of iterative reconstruction technique. CONTRAST:  100mL OMNIPAQUE  IOHEXOL  300 MG/ML  SOLN COMPARISON:  None available. FINDINGS: Lower chest: No focal airspace consolidation or pleural effusion.3 mm nodule, partially visualized, in the subpleural right lower  lobe (axial 1). Hepatobiliary: 1.7 cm lesion in the lateral right hepatic lobe (axial 23). Posterior left hepatic lobe lesion measuring 1.8 cm (axial 14).6 mm hypodensity in the central aspect of segment 4 (axial 22).No radiopaque stones or wall thickening of the gallbladder. No intrahepatic or extrahepatic biliary ductal dilation. The portal veins are patent. Pancreas: No mass or main ductal dilation. No peripancreatic inflammation or fluid collection. Spleen: Normal size. No mass. Adrenals/Urinary Tract: No adrenal masses. No renal mass. Mild right-sided hydroureteronephrosis. No nephrolithiasis in either kidney. The urinary bladder is distended without focal abnormality. Stomach/Bowel: The stomach is decompressed without focal abnormality. No small bowel wall thickening or inflammation. No small bowel obstruction.Asymmetric wall thickening enhancement in the cecum extending into the appendiceal orifice (axial 50, sagittal 61), worrisome for underlying colonic neoplasm.The appendix is fluid-filled and dilated measuring up to 1.9 cm with periappendiceal inflammation. Vascular/Lymphatic: No aortic aneurysm. A couple of prominent, right ileocolic chain lymph nodes are present measuring up to 7 mm (axial 50-52). Reproductive: The uterus and ovaries are within normal limits for patient's age.No free pelvic fluid. Other: No pneumoperitoneum, ascites, or mesenteric inflammation. Musculoskeletal: No acute fracture or destructive lesion. Multilevel thoracic osteophytosis. IMPRESSION: 1. Asymmetric wall thickening and enhancement in the cecum, measuring 4.3 cm in craniocaudal dimension, and involving the appendiceal orifice (axial 50, sagittal 61), worrisome for underlying colonic neoplasm. Likely causing obstruction of the appendix, which is fluid-filled and dilated appendix, suggesting acute appendicitis. Surgical consultation recommended. 2. A couple of mildly prominent right ileocolic chain lymph nodes are present  measuring up to 7 mm (axial 50-52). These could be reactive or metastatic. 3. There are 3 hypodense lesions in both lobes of the liver, measuring up to 1.8 cm, likely metastatic disease. Critical  Value/emergent results were called by telephone at the time of interpretation on 04/06/2024 at 3:00 pm to provider LAMAR PRICE , who verbally acknowledged these results. Electronically Signed   By: Rogelia Myers M.D.   On: 04/06/2024 15:11   DG Chest Port 1 View Result Date: 04/06/2024 EXAM: 1 VIEW(S) XRAY OF THE CHEST 04/06/2024 11:36:23 AM COMPARISON: None available. CLINICAL HISTORY: chest pain. Chest pain FINDINGS: LUNGS AND PLEURA: No focal pulmonary opacity. No pulmonary edema. No pleural effusion. No pneumothorax. HEART AND MEDIASTINUM: No acute abnormality of the cardiac and mediastinal silhouettes. BONES AND SOFT TISSUES: No acute osseous abnormality. IMPRESSION: 1. Normal chest radiograph. Electronically signed by: Lonni Necessary MD 04/06/2024 12:35 PM EDT RP Workstation: [id number]     Assessment/Plan:  43 y.o. 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 Layvonne input and concur with MRI to evaluate for hepatic mets.    - GI consult re: cecal mass.    - No need for urgent operative intervention, may benefit from completion of work up and potential treatment with further clarification of process at hand.    - Will follow with you.   All of the above findings and recommendations were discussed with the patient and  family(if present), and all of patient's and present family's questions were answered to their expressed satisfaction.  I personally spent a total of 80 minutes in the care of the patient today including preparing to see the patient, getting/reviewing separately obtained history, performing a medically appropriate exam/evaluation, referring and communicating with other health care professionals, documenting clinical information in the EHR, independently interpreting results, and communicating results.    Thank you for the opportunity to participate in this patient's care.   -- Honor Leghorn, M.D., FACS 04/06/2024, 5:32 PM

## 2024-04-06 NOTE — ED Provider Notes (Addendum)
 [facility] Provider Note    Event Date/Time   First MD Initiated Contact with Patient 04/06/24 1108     (approximate)   History   Chest Pain   HPI  Amber Price is a 43 y.o. female with history of rheumatoid arthritis, takes methotrexate and folic acid, history of hypertension who presents with complaints of chest discomfort, dysuria, back pain.  Symptoms been ongoing for about 2 to 3 days, her PCP referred her here because of the chest discomfort.  She reports currently not having any chest discomfort, no shortness of breath at this time.  Does not think that she has had any fevers.     Physical Exam   Triage Vital Signs: ED Triage Vitals  Encounter Vitals Group     BP 04/06/24 1101 (!) 143/97     Girls Systolic BP Percentile --      Girls Diastolic BP Percentile --      Boys Systolic BP Percentile --      Boys Diastolic BP Percentile --      Pulse Rate 04/06/24 1101 (!) 142     Resp 04/06/24 1101 20     Temp 04/06/24 1101 98.7 F (37.1 C)     Temp Source 04/06/24 1101 Oral     SpO2 04/06/24 1101 100 %     Weight 04/06/24 1102 108 kg (238 lb)     Height 04/06/24 1102 1.753 m (5' 9)     Head Circumference --      Peak Flow --      Pain Score 04/06/24 1103 5     Pain Loc --      Pain Education --      Exclude from Growth Chart --     Most recent vital signs: Vitals:   04/06/24 1330 04/06/24 1400  BP: (!) 147/83 97/72  Pulse: (!) 116 (!) 146  Resp: (!) 24 (!) 28  Temp:    SpO2: 100% 100%     General: Awake, no distress.  CV:  Good peripheral perfusion.  Resp:  Normal effort.  Clear to auscultation bilaterally, mild tachypnea noted Abd:  No distention.  Soft, nontender, overall reassuring exam no significant CVA tenderness Other:     ED Results / Procedures / Treatments   Labs (all labs ordered are listed, but only abnormal results are displayed) Labs Reviewed  CBC - Abnormal; Notable for the following components:       Result Value   WBC 14.4 (*)    Hemoglobin 11.3 (*)    HCT 35.3 (*)    MCV 78.4 (*)    MCH 25.1 (*)    All other components within normal limits  COMPREHENSIVE METABOLIC PANEL WITH GFR - Abnormal; Notable for the following components:   Glucose, Bld 103 (*)    Creatinine, Ser 1.11 (*)    Calcium  8.8 (*)    Albumin 3.3 (*)    All other components within normal limits  URINALYSIS, ROUTINE W REFLEX MICROSCOPIC - Abnormal; Notable for the following components:   Color, Urine YELLOW (*)    APPearance HAZY (*)    Hgb urine dipstick MODERATE (*)    Protein, ur 30 (*)    Nitrite POSITIVE (*)    Leukocytes,Ua MODERATE (*)    Bacteria, UA MANY (*)    All other components within normal limits  CULTURE, BLOOD (ROUTINE X 2)  CULTURE, BLOOD (ROUTINE X 2)  URINE CULTURE  LIPASE, BLOOD  CBC WITH  DIFFERENTIAL/PLATELET  LACTIC ACID, PLASMA  LACTIC ACID, PLASMA  PROTIME-INR  APTT  LACTIC ACID, PLASMA  LACTIC ACID, PLASMA  CBC  CBC WITH DIFFERENTIAL/PLATELET  COMPREHENSIVE METABOLIC PANEL WITH GFR  POC URINE PREG, ED  TROPONIN I (HIGH SENSITIVITY)     EKG  ED ECG REPORT I, Lamar Price, the attending physician, personally viewed and interpreted this ECG.  Date: 04/06/2024  Rhythm: Sinus tachycardia QRS Axis: normal Intervals: normal ST/T Wave abnormalities: normal Narrative Interpretation: no evidence of acute ischemia    RADIOLOGY  CT abdomen pelvis   PROCEDURES:  Critical Care performed: yes  CRITICAL CARE Performed by: Lamar Price   Total critical care time: 30 minutes  Critical care time was exclusive of separately billable procedures and treating other patients.  Critical care was necessary to treat or prevent imminent or life-threatening deterioration.  Critical care was time spent personally by me on the following activities: development of treatment plan with patient and/or surrogate as well as nursing, discussions with consultants, evaluation of  patient's response to treatment, examination of patient, obtaining history from patient or surrogate, ordering and performing treatments and interventions, ordering and review of laboratory studies, ordering and review of radiographic studies, pulse oximetry and re-evaluation of patient's condition.   Procedures   MEDICATIONS ORDERED IN ED: Medications  0.9 %  sodium chloride  infusion (has no administration in time range)  sodium chloride  0.9 % bolus 1,000 mL (has no administration in time range)  cefTRIAXone  (ROCEPHIN ) 1 g in sodium chloride  0.9 % 100 mL IVPB (has no administration in time range)  enoxaparin  (LOVENOX ) injection 55 mg (has no administration in time range)  acetaminophen  (TYLENOL ) tablet 650 mg (has no administration in time range)    Or  acetaminophen  (TYLENOL ) suppository 650 mg (has no administration in time range)  HYDROcodone -acetaminophen  (NORCO/VICODIN) 5-325 MG per tablet 1-2 tablet (has no administration in time range)  morphine  (PF) 2 MG/ML injection 2 mg (has no administration in time range)  senna-docusate (Senokot-S) tablet 1 tablet (has no administration in time range)  ondansetron  (ZOFRAN ) tablet 4 mg (has no administration in time range)    Or  ondansetron  (ZOFRAN ) injection 4 mg (has no administration in time range)  0.9 %  sodium chloride  infusion (0 mLs Intravenous Stopped 04/06/24 1434)  cefTRIAXone  (ROCEPHIN ) 1 g in sodium chloride  0.9 % 100 mL IVPB (0 g Intravenous Stopped 04/06/24 1514)  iohexol  (OMNIPAQUE ) 300 MG/ML solution 100 mL (100 mLs Intravenous Contrast Given 04/06/24 1409)     IMPRESSION / MDM / ASSESSMENT AND PLAN / ED COURSE  I reviewed the triage vital signs and the nursing notes. Patient's presentation is most consistent with acute presentation with potential threat to life or bodily function.  Patient presents with symptoms as above with significant tachycardia.  Differential is wide, including arrhythmia, ACS, pneumonia, UTI,  pyelonephritis, ureterolithiasis  Will treat with IV fluids, obtain labs, chest x-ray, urinalysis  Even after a liter fluid patient remains significantly tachycardic, her white blood cell count is elevated at 14.4, she is immunosuppressed, urinalysis consistent with UTI, will send for CT abdomen pelvis to evaluate for ureterolithiasis or other abnormality, will treat with IV Rocephin  in the meantime  Discussed with hospitalist who will admit   ----------------------------------------- 3:18 PM on 04/06/2024 ----------------------------------------- Contacted by radiology and informed of new colonic mass causing likely appendicitis, concern for possible metastases as well.  Consulted with general surgery, they will evaluate the patient.  I have requested oncology consultation as well.  The  patient has been made n.p.o.        FINAL CLINICAL IMPRESSION(S) / ED DIAGNOSES   Final diagnoses:  Pyelonephritis  Colonic mass     Rx / DC Orders   ED Discharge Orders     None        Note:  This document was prepared using Dragon voice recognition software and may include unintentional dictation errors.   Arlander [place], MD 04/06/24 1441    Arlander [place], MD 04/06/24 [phone]

## 2024-04-06 NOTE — Consult Note (Signed)
 Hematology/Oncology Consult note [phone] [phone]      Patient Care Team: Fernand Fredy RAMAN, MD as PCP - General (Internal Medicine)   Name of the patient: Amber Price  [id number]  03/08/1981   REASON FOR COSULTATION:  Colon mass, requested by Dr. Arlander History of presenting illness-  43 y.o. female with PMH listed at below who presents to ER for evaluation of urinary frequency and dysuria.  She also reports chest pressure. In the emergency room, she is tachycardic, Tmax 98.7.  Mild leukocytosis with a total white count 14.4. Urinary analysis showed leukocyte esterase and nitrates.  She had a CT scan of abdomen pelvis with contrast. CT showed asymmetric wall thickening and enhancement in the cecum measuring 4.3 cm, involving appendiceal orifice, worrisome for colonic neoplasm.  Likely causing obstruction of appendix which is fluid-filled and dilated appendix.  Right ileocolic chain lymph nodes.  3 hypodense lesions in both lobes of liver.  Measuring up to 1.8 cm.  CT angiogram of chest showed no PE.   Patient was admitted and is on IV antibiotics for SIRS secondary to urinary tract infection.  Oncology was consulted for abnormal CT scan results.  Surgery has been also consulted.  Patient denies any change of bowel habits, blood in the stool, melena.  Denies any family history of colon cancer.   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) 11/20/2022   Mixed hyperlipidemia 11/20/2022   Essential hypertension, benign 08/19/2022   Anemia complicating pregnancy in third trimester 01/05/2019   Rubella non-immune status, antepartum 01/05/2019   Labor and delivery,  indication for care 11/28/2018   Advanced maternal age in multigravida, first trimester    Fibroid 12/15/2017   History of herpes genitalis 07/03/2017   Obesity (BMI 30-39.9) 07/03/2017     Past Medical History:  Diagnosis Date   Anemia    In pregnancy   Bacterial vaginosis    Cervical dysplasia 2002   [place]   Herpes    History of Papanicolaou smear of cervix 01/09/2015   -/-   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 RECK 2018 ANNUAL   Sickle cell trait    Vitamin D  deficiency 01/2016   ADD VIT D3 5000 IU DAILY     Past Surgical History:  Procedure Laterality Date   NO PAST SURGERIES      Social History   Socioeconomic History   Marital status: Single    Spouse name: Not on file   Number of children: 3   Years of education: 14   Highest education level: Not on file  Occupational History   Occupation: BUSINESS OFFICE SPECIALIST  Tobacco Use   Smoking status: Never   Smokeless tobacco: Never  Vaping Use   Vaping status: Never Used  Substance and Sexual Activity   Alcohol use: Not Currently   Drug use: No   Sexual activity: Yes    Birth control/protection: Pill  Other Topics Concern   Not on file  Social History Narrative   Not on file   Social Drivers of Health   Financial Resource Strain: Not on file  Food  Insecurity: Not on file  Transportation Needs: Not on file  Physical Activity: Not on file  Stress: Not on file  Social Connections: Not on file  Intimate Partner Violence: Not on file     Family History  Problem Relation Age of Onset   Diabetes Mellitus II Mother    Hypertension Mother    Hypercholesterolemia Father    Cancer Paternal Grandmother 87       LUNG   Sickle cell trait Son      Current Facility-Administered Medications:    0.9 %  sodium chloride  infusion, , Intravenous, Continuous, Stephens, Tiona K, MD   acetaminophen  (TYLENOL ) tablet 650 mg, 650 mg, Oral, Q6H  PRN **OR** acetaminophen  (TYLENOL ) suppository 650 mg, 650 mg, Rectal, Q6H PRN, Stephens, Tiona K, MD   ceFEPIme  (MAXIPIME ) 2 g in sodium chloride  0.9 % 100 mL IVPB, 2 g, Intravenous, Q12H, Stephens, Tiona K, MD, Last Rate: 200 mL/hr at 04/06/24 1651, 2 g at 04/06/24 1651   enoxaparin  (LOVENOX ) injection 55 mg, 55 mg, Subcutaneous, Q24H, Stephens, Tiona K, MD   HYDROcodone -acetaminophen  (NORCO/VICODIN) 5-325 MG per tablet 1-2 tablet, 1-2 tablet, Oral, Q4H PRN, Stephens, Tiona K, MD   labetalol  (NORMODYNE ) injection 10 mg, 10 mg, Intravenous, Once, Stephens, Tiona K, MD   morphine  (PF) 2 MG/ML injection 2 mg, 2 mg, Intravenous, Q2H PRN, Stephens, Tiona K, MD   ondansetron  (ZOFRAN ) tablet 4 mg, 4 mg, Oral, Q6H PRN **OR** ondansetron  (ZOFRAN ) injection 4 mg, 4 mg, Intravenous, Q6H PRN, Stephens, Tiona K, MD   senna-docusate (Senokot-S) tablet 1 tablet, 1 tablet, Oral, QHS PRN, Stephens, Tiona K, MD  Review of Systems  Constitutional:  Positive for fatigue. Negative for appetite change, chills, fever and unexpected weight change.  HENT:   Negative for hearing loss and voice change.   Eyes:  Negative for eye problems.  Respiratory:  Negative for chest tightness and cough.   Cardiovascular:  Negative for chest pain.  Gastrointestinal:  Negative for abdominal distention, abdominal pain, blood in stool, nausea and vomiting.  Endocrine: Negative for hot flashes.  Genitourinary:  Positive for dysuria and frequency. Negative for difficulty urinating.   Musculoskeletal:  Negative for arthralgias.  Skin:  Negative for itching and rash.  Neurological:  Negative for extremity weakness.  Hematological:  Negative for adenopathy.  Psychiatric/Behavioral:  Negative for confusion.     PHYSICAL EXAM Vitals:   04/06/24 1400 04/06/24 1430 04/06/24 1515 04/06/24 1535  BP: 97/72  (!) 150/85 (!) 150/90  Pulse: (!) 146  (!) 132 (!) 136  Resp: (!) 28 (!) 24  16  Temp:      TempSrc:      SpO2: 100%  100% 98%   Weight:      Height:       Physical Exam Constitutional:      General: She is not in acute distress.    Appearance: She is not diaphoretic.  HENT:     Head: Normocephalic and atraumatic.  Eyes:     General: No scleral icterus. Cardiovascular:     Rate and Rhythm: Tachycardia present.  Pulmonary:     Effort: Pulmonary effort is normal. No respiratory distress.     Breath sounds: No wheezing.  Abdominal:     General: Bowel sounds are normal. There is no distension.     Palpations: Abdomen is soft.     Tenderness: There is no abdominal tenderness.  Musculoskeletal:        General: Normal range of motion.  Skin:  General: Skin is warm and dry.     Findings: No erythema.  Neurological:     Mental Status: She is alert and oriented to person, place, and time. Mental status is at baseline.     Motor: No abnormal muscle tone.  Psychiatric:        Mood and Affect: Mood and affect normal.       LABORATORY STUDIES    Latest Ref Rng & Units 04/06/2024    3:41 PM 04/06/2024   11:24 AM 01/11/2024   10:18 AM  CBC  WBC 4.0 - 10.5 K/uL 14.9  14.4  6.7   Hemoglobin 12.0 - 15.0 g/dL 88.1  88.6  88.7   Hematocrit 36.0 - 46.0 % 35.7  35.3  35.6   Platelets 150 - 400 K/uL 316  316  310       Latest Ref Rng & Units 04/06/2024    3:41 PM 04/06/2024   11:24 AM 01/11/2024   10:18 AM  CMP  Glucose 70 - 99 mg/dL 894  896  88   BUN 6 - 20 mg/dL 7  9  8    Creatinine 0.44 - 1.00 mg/dL 8.96  8.88  8.99   Sodium 135 - 145 mmol/L 138  136  136   Potassium 3.5 - 5.1 mmol/L 3.8  3.5  4.8   Chloride 98 - 111 mmol/L 103  103  103   CO2 22 - 32 mmol/L 23  22  20    Calcium  8.9 - 10.3 mg/dL 8.6  8.8  8.8   Total Protein 6.5 - 8.1 g/dL 7.3  7.3  6.3   Total Bilirubin 0.0 - 1.2 mg/dL 1.1  0.9  0.3   Alkaline Phos 38 - 126 U/L 54  56  62   AST 15 - 41 U/L 18  16  14    ALT 0 - 44 U/L 12  13  9       RADIOGRAPHIC STUDIES: I have personally reviewed the radiological images as listed and agreed with  the findings in the report. CT ABDOMEN PELVIS W CONTRAST Result Date: 04/06/2024 CLINICAL DATA:  Acute aortic syndrome (AAS) suspected, left-sided, sharp, nonradiating chest pain. EXAM: CT ABDOMEN AND PELVIS WITH CONTRAST TECHNIQUE: Multidetector CT imaging of the abdomen and pelvis was performed using the standard protocol following bolus administration of intravenous contrast. RADIATION DOSE REDUCTION: This exam was performed according to the departmental dose-optimization program which includes automated exposure control, adjustment of the mA and/or kV according to patient size and/or use of iterative reconstruction technique. CONTRAST:  100mL OMNIPAQUE  IOHEXOL  300 MG/ML  SOLN COMPARISON:  None available. FINDINGS: Lower chest: No focal airspace consolidation or pleural effusion.3 mm nodule, partially visualized, in the subpleural right lower lobe (axial 1). Hepatobiliary: 1.7 cm lesion in the lateral right hepatic lobe (axial 23). Posterior left hepatic lobe lesion measuring 1.8 cm (axial 14).6 mm hypodensity in the central aspect of segment 4 (axial 22).No radiopaque stones or wall thickening of the gallbladder. No intrahepatic or extrahepatic biliary ductal dilation. The portal veins are patent. Pancreas: No mass or main ductal dilation. No peripancreatic inflammation or fluid collection. Spleen: Normal size. No mass. Adrenals/Urinary Tract: No adrenal masses. No renal mass. Mild right-sided hydroureteronephrosis. No nephrolithiasis in either kidney. The urinary bladder is distended without focal abnormality. Stomach/Bowel: The stomach is decompressed without focal abnormality. No small bowel wall thickening or inflammation. No small bowel obstruction.Asymmetric wall thickening enhancement in the cecum extending into the appendiceal orifice (axial 50, sagittal  61), worrisome for underlying colonic neoplasm.The appendix is fluid-filled and dilated measuring up to 1.9 cm with periappendiceal inflammation.  Vascular/Lymphatic: No aortic aneurysm. A couple of prominent, right ileocolic chain lymph nodes are present measuring up to 7 mm (axial 50-52). Reproductive: The uterus and ovaries are within normal limits for patient's age.No free pelvic fluid. Other: No pneumoperitoneum, ascites, or mesenteric inflammation. Musculoskeletal: No acute fracture or destructive lesion. Multilevel thoracic osteophytosis. IMPRESSION: 1. Asymmetric wall thickening and enhancement in the cecum, measuring 4.3 cm in craniocaudal dimension, and involving the appendiceal orifice (axial 50, sagittal 61), worrisome for underlying colonic neoplasm. Likely causing obstruction of the appendix, which is fluid-filled and dilated appendix, suggesting acute appendicitis. Surgical consultation recommended. 2. A couple of mildly prominent right ileocolic chain lymph nodes are present measuring up to 7 mm (axial 50-52). These could be reactive or metastatic. 3. There are 3 hypodense lesions in both lobes of the liver, measuring up to 1.8 cm, likely metastatic disease. Critical Value/emergent results were called by telephone at the time of interpretation on 04/06/2024 at 3:00 pm to provider LAMAR PRICE , who verbally acknowledged these results. Electronically Signed   By: Rogelia Myers M.D.   On: 04/06/2024 15:11   DG Chest Port 1 View Result Date: 04/06/2024 EXAM: 1 VIEW(S) XRAY OF THE CHEST 04/06/2024 11:36:23 AM COMPARISON: None available. CLINICAL HISTORY: chest pain. Chest pain FINDINGS: LUNGS AND PLEURA: No focal pulmonary opacity. No pulmonary edema. No pleural effusion. No pneumothorax. HEART AND MEDIASTINUM: No acute abnormality of the cardiac and mediastinal silhouettes. BONES AND SOFT TISSUES: No acute osseous abnormality. IMPRESSION: 1. Normal chest radiograph. Electronically signed by: Lonni Necessary MD 04/06/2024 12:35 PM EDT RP Workstation: [id number]     Assessment and plan-   # UTI On IV antibiotics for urine and blood  culture.  # Cecum wall thickening, with dilated and fluid-filled appendix. Multiple liver lesions. Physical examination did not show any guarding or tenderness of right lower quadrant. Liver lesions, DDx metastasis versus abscess.  Radiology favor metastasis.  Recommend colonoscopy.  I recommend obtaining MRI of liver for further evaluation.  Check CEA. She will also need liver lesion biopsy in the future. Discussed with surgery, she does not need urgent surgery.  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  # Microcytic anemia, MCV 77.4.  Will check iron panel outpatient.  Thank you for allowing me to participate in the care of this patient.   Zelphia Cap, MD, PhD Hematology Oncology 04/06/2024

## 2024-04-07 ENCOUNTER — Inpatient Hospital Stay

## 2024-04-07 DIAGNOSIS — K6389 Other specified diseases of intestine: Secondary | ICD-10-CM | POA: Diagnosis not present

## 2024-04-07 DIAGNOSIS — A4151 Sepsis due to Escherichia coli [E. coli]: Secondary | ICD-10-CM | POA: Diagnosis not present

## 2024-04-07 DIAGNOSIS — R933 Abnormal findings on diagnostic imaging of other parts of digestive tract: Secondary | ICD-10-CM

## 2024-04-07 DIAGNOSIS — R7881 Bacteremia: Secondary | ICD-10-CM | POA: Diagnosis present

## 2024-04-07 DIAGNOSIS — K36 Other appendicitis: Secondary | ICD-10-CM | POA: Diagnosis not present

## 2024-04-07 LAB — BASIC METABOLIC PANEL WITH GFR
Anion gap: 8 (ref 5–15)
BUN: 9 mg/dL (ref 6–20)
CO2: 23 mmol/L (ref 22–32)
Calcium: 8.1 mg/dL — ABNORMAL LOW (ref 8.9–10.3)
Chloride: 109 mmol/L (ref 98–111)
Creatinine, Ser: 0.92 mg/dL (ref 0.44–1.00)
GFR, Estimated: >60 mL/min (ref >60)
Glucose, Bld: 82 mg/dL (ref 70–99)
Potassium: 3.6 mmol/L (ref 3.5–5.1)
Sodium: 140 mmol/L (ref 135–145)

## 2024-04-07 LAB — CBC
HCT: 31.5 % — ABNORMAL LOW (ref 36.0–46.0)
Hemoglobin: 10.3 g/dL — ABNORMAL LOW (ref 12.0–15.0)
MCH: 25.4 pg — ABNORMAL LOW (ref 26.0–34.0)
MCHC: 32.7 g/dL (ref 30.0–36.0)
MCV: 77.6 fL — ABNORMAL LOW (ref 80.0–100.0)
Platelets: 307 K/uL (ref 150–400)
RBC: 4.06 MIL/uL (ref 3.87–5.11)
RDW: 15 % (ref 11.5–15.5)
WBC: 10.3 K/uL (ref 4.0–10.5)
nRBC: 0 % (ref 0.0–0.2)

## 2024-04-07 LAB — BLOOD CULTURE ID PANEL (REFLEXED) - BCID2

## 2024-04-07 LAB — ECHOCARDIOGRAM COMPLETE
Height: 69 in
S' Lateral: 2.3 cm
Weight: 3808 [oz_av]

## 2024-04-07 MED ORDER — SODIUM CHLORIDE 0.9 % IV SOLN: INTRAVENOUS | Status: AC

## 2024-04-07 MED ORDER — PEG 3350-KCL-NA BICARB-NACL 420 G PO SOLR
4000.0000 mL | Freq: Once | ORAL | Status: AC
  Administered 2024-04-07: 4000 mL via ORAL
  Filled 2024-04-07: qty 4000

## 2024-04-07 MED ORDER — GADOBUTROL 1 MMOL/ML IV SOLN
10.0000 mL | Freq: Once | INTRAVENOUS | Status: AC | PRN
  Administered 2024-04-07: 10 mL via INTRAVENOUS

## 2024-04-07 MED ORDER — CEFTRIAXONE SODIUM 2 G IJ SOLR
2.0000 g | INTRAMUSCULAR | Status: DC
  Administered 2024-04-07 – 2024-04-08 (×2): 2 g via INTRAVENOUS
  Filled 2024-04-07 (×4): qty 20

## 2024-04-07 NOTE — Progress Notes (Signed)
 PHARMACY - PHYSICIAN COMMUNICATION CRITICAL VALUE ALERT - BLOOD CULTURE IDENTIFICATION (BCID)  Amber Price is an 43 y.o. female who presented to [facility] on 04/06/2024 with a chief complaint of UTI  Assessment:  E Coli in 4 of 4 bottles , no resistance (include suspected source if known)  Name of physician (or Provider) Contacted: [place]  Current antibiotics: Cefepime  2 gm IV Q12H   Changes to prescribed antibiotics recommended:  Recommendations accepted by provider  - will d/c cefepime  and start Ceftriaxone  2 gm IV Q24H   No results found for this or any previous visit.  Dietrick Barris D 04/07/2024  1:56 AM

## 2024-04-07 NOTE — Consult Note (Signed)
 Consultation  Referring Provider:     Hospitalist Admit date: 04/06/2024 Consult date: 04/06/2024         Reason for Consultation: Abnormal imaging              HPI:   Amber Price is a 43 y.o. lady with history of obesity here with fatigue, dysuria, and mild fever and incidentally found to have cecal mass. Patient has never had a colonoscopy. No family history of GI malignancies. No significant abdominal surgeries. No blood thinners. When examined yesterday, heart rate was in the 130's.   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 RECK 2018 ANNUAL   Sickle cell trait    Vitamin D  deficiency 01/2016   ADD VIT D3 5000 IU DAILY    Past Surgical History:  Procedure Laterality Date   NO PAST SURGERIES      Family History  Problem Relation Age of Onset   Diabetes Mellitus II Mother    Hypertension Mother    Hypercholesterolemia Father    Cancer Paternal Grandmother 3       LUNG   Sickle cell trait Son     Social History   Tobacco Use   Smoking status: Never   Smokeless tobacco: Never  Vaping Use   Vaping status: Never Used  Substance Use Topics   Alcohol use: Not Currently   Drug use: No    Prior to Admission medications   Medication Sig Start Date End Date Taking? Authorizing Provider  amLODipine  (NORVASC ) 5 MG tablet Take 1 tablet (5 mg total) by mouth daily. 07/02/23 07/01/24 Yes Fernand Fredy RAMAN, MD  Cholecalciferol (VITAMIN D3) 1.25 MG (50000 UT) CAPS TAKE 1 CAPSULE BY MOUTH ONE TIME PER WEEK 08/27/23  Yes Fernand Fredy RAMAN, MD  folic acid (FOLVITE) 1 MG tablet Take 1 mg by mouth daily. 04/04/24 04/04/25 Yes [provider]  methotrexate (RHEUMATREX) 2.5 MG tablet Take 7.5 mg by mouth once a week.   Yes  [provider]  rosuvastatin  (CRESTOR ) 20 MG tablet TAKE 1 TABLET BY MOUTH EVERY DAY 11/20/23  Yes Orlean Alan CHRISTELLA, FNP  valACYclovir  (VALTREX ) 500 MG tablet Take 1 tablet (500 mg total) by mouth daily. 01/11/24  Yes Fernand Fredy RAMAN, MD  celecoxib  (CELEBREX ) 200 MG capsule Take 1 capsule (200 mg total) by mouth daily. Patient not taking: Reported on 01/11/2024 08/20/23   Fernand Fredy RAMAN, MD  JUNEL  FE 1/20 1-20 MG-MCG tablet TAKE 1 TABLET BY MOUTH EVERY DAY 11/25/23   Orlean Alan CHRISTELLA, FNP    Current Facility-Administered Medications  Medication Dose Route Frequency Provider Last Rate Last Admin   acetaminophen  (TYLENOL ) tablet 650 mg  650 mg Oral Q6H PRN Stephens, Tiona K, MD       Or   acetaminophen  (TYLENOL ) suppository 650 mg  650 mg Rectal Q6H PRN Stephens, Tiona K, MD       cefTRIAXone  (ROCEPHIN ) 2 g in sodium chloride  0.9 % 100 mL IVPB  2 g Intravenous Q24H Stephens, Tiona K, MD 200 mL/hr at 04/07/24 0633 2 g at 04/07/24 9366   enoxaparin  (LOVENOX ) injection 55 mg  55 mg  Subcutaneous Q24H Stephens, Tiona K, MD   55 mg at 04/06/24 2112   HYDROcodone -acetaminophen  (NORCO/VICODIN) 5-325 MG per tablet 1-2 tablet  1-2 tablet Oral Q4H PRN Stephens, Tiona K, MD       labetalol  (NORMODYNE ) injection 10 mg  10 mg Intravenous Once Stephens, Tiona K, MD       metoprolol  tartrate (LOPRESSOR ) tablet 12.5 mg  12.5 mg Oral BID Stephens, Tiona K, MD   12.5 mg at 04/07/24 1048   morphine  (PF) 2 MG/ML injection 2 mg  2 mg Intravenous Q2H PRN Stephens, Tiona K, MD       ondansetron  (ZOFRAN ) tablet 4 mg  4 mg Oral Q6H PRN Stephens, Tiona K, MD       Or   ondansetron  (ZOFRAN ) injection 4 mg  4 mg Intravenous Q6H PRN Stephens, Tiona K, MD       senna-docusate (Senokot-S) tablet 1 tablet  1 tablet Oral QHS PRN Stephens, Tiona K, MD        Allergies as of 04/06/2024   (No Known Allergies)     Review of Systems:    All systems reviewed and negative except where noted in HPI.  Review of Systems   Constitutional:  Positive for malaise/fatigue. Negative for chills and fever.  Respiratory:  Negative for shortness of breath.   Cardiovascular:  Negative for chest pain.  Gastrointestinal:  Negative for blood in stool.  Genitourinary:  Positive for dysuria.  Musculoskeletal:  Negative for joint pain.  Skin:  Negative for rash.  Neurological:  Negative for focal weakness.  Psychiatric/Behavioral:  Negative for substance abuse.   All other systems reviewed and are negative.      Physical Exam:  Vital signs in last 24 hours: Temp:  [98 F (36.7 C)-98.4 F (36.9 C)] 98.1 F (36.7 C) (09/25 1059) Pulse Rate:  [70-146] 100 (09/25 1059) Resp:  [16-28] 16 (09/25 0434) BP: (97-159)/(59-90) 154/86 (09/25 1059) SpO2:  [96 %-100 %] 98 % (09/25 1059) Last BM Date : 04/07/24 General:   Pleasant in NAD Head:  Normocephalic and atraumatic. Eyes:   No icterus.   Conjunctiva pink. Ears:  Normal auditory acuity. Mouth: Mucosa pink moist, no lesions. Neck:  Supple; no masses felt Lungs: No respiratory distress Abdomen:   Flat, soft, nondistended, nontender Msk:  Strength 5/5. Symmetrical without gross deformities. Neurologic:  Alert and  oriented x4;  No focal deficits Skin:  Warm, dry, pink without significant lesions or rashes. Psych:  Alert and cooperative. Normal affect.  LAB RESULTS: Recent Labs    04/06/24 1124 04/06/24 1541 04/07/24 0624  WBC 14.4* 14.9* 10.3  HGB 11.3* 11.8* 10.3*  HCT 35.3* 35.7* 31.5*  PLT 316 316 307   BMET Recent Labs    04/06/24 1124 04/06/24 1541 04/07/24 0624  NA 136 138 140  K 3.5 3.8 3.6  CL 103 103 109  CO2 22 23 23   GLUCOSE 103* 105* 82  BUN 9 7 9   CREATININE 1.11* 1.03* 0.92  CALCIUM  8.8* 8.6* 8.1*   LFT Recent Labs    04/06/24 1541  PROT 7.3  ALBUMIN 3.1*  AST 18  ALT 12  ALKPHOS 54  BILITOT 1.1   PT/INR Recent Labs    04/06/24 1511  LABPROT 13.7  INR 1.0    STUDIES: MR LIVER W WO CONTRAST Result Date:  04/07/2024 EXAM: MRCP WITHOUT AND WITH IV CONTRAST 04/07/2024 09:30:07 AM TECHNIQUE: Multisequence, multiplanar magnetic resonance images of the abdomen without and with intravenous contrast. MRCP sequences  were performed. 10 mL gadobutrol  (GADAVIST ) 1 MMOL/ML injection 10 mL GADOBUTROL  1 MMOL/ML IV SOLN was administered. COMPARISON: CT of yesterday. CLINICAL HISTORY: Liver lesion, > 1cm. Followup from CT. Eval liver mets? 10 gad given. No hx ca per patient. FINDINGS: LIVER: No significant steatosis. Mild hepatomegaly at 18.5 cm craniocaudal. Multiple bilateral liver lesions are identified. The most suspicious lesion is in the posterior aspect of segment 2 at 2.0 cm on 23 / 17. demonstrates early and delayed hypoenhancement as well as restricted diffusion on image 14 / 12. A segment 4b 6 mm hypoenhancing lesion on 38 / 17 is also suspicious for metastasis. Anterior right hepatic lobe 1.3 cm lesion on image 16 / 15 is moderately T2 hyperintense and demonstrates possible peripheral nodular enhancement on image 35 / 19. less distinct on other postcontrast images. Finally, within the Right Hepatic Lobe, there are observations of 1.0 and 1.4 cm on early postcontrast images 38 / 17 and 44 / 17 which are relatively occult on other pulse sequences favoring hemangiomas. GALLBLADDER AND BILIARY SYSTEM: Gallbladder is unremarkable. No intrahepatic or extrahepatic ductal dilation. SPLEEN: Unremarkable. PANCREAS/PANCREATIC DUCT: Visualized pancreas is unremarkable. No pancreatic ductal dilatation. ADRENAL GLANDS: Unremarkable. KIDNEYS: Unremarkable. LYMPH NODES: Mild ileocolic mesenteric adenopathy at 9 mm on image 44 / 23. VASCULATURE: Unremarkable. PERITONEUM: No ascites. ABDOMINAL WALL: No hernia. No mass. BOWEL: The cecal mass on CT is again identified including on image 44 / 23. BONES: No acute abnormality or worrisome osseous lesion. SOFT TISSUES: MISCELLANEOUS: Unremarkable. IMPRESSION: 1. Multiple bilateral liver  lesions. 2 left hepatic lobe lesions are highly suspicious for metastasis. 1 right liver lobe lesion is indeterminate but favored to represent a hemangioma. 2 smaller right liver lobe lesions are favored to be benign, likely hemangiomas. Consider sampling of the segment 2 lesion versus further evaluation with PET. 2.  Cecal mass with adjacent adenopathy, suspicious for nodal metastasis. Electronically signed by: Rockey Kilts MD 04/07/2024 10:10 AM EDT RP Workstation: [id number]   CT Angio Chest Pulmonary Embolism (PE) W or WO Contrast Result Date: 04/06/2024 CLINICAL DATA:  Left-sided chest pain. EXAM: CT ANGIOGRAPHY CHEST WITH CONTRAST TECHNIQUE: Multidetector CT imaging of the chest was performed using the standard protocol during bolus administration of intravenous contrast. Multiplanar CT image reconstructions and MIPs were obtained to evaluate the vascular anatomy. RADIATION DOSE REDUCTION: This exam was performed according to the departmental dose-optimization program which includes automated exposure control, adjustment of the mA and/or kV according to patient size and/or use of iterative reconstruction technique. CONTRAST:  75mL OMNIPAQUE  IOHEXOL  350 MG/ML SOLN COMPARISON:  None Available. FINDINGS: Cardiovascular: Satisfactory opacification of the pulmonary arteries to the segmental level. No evidence of pulmonary embolism. Normal heart size. No pericardial effusion. Mediastinum/Nodes: No enlarged mediastinal, hilar, or axillary lymph nodes. Thyroid  gland, trachea, and esophagus demonstrate no significant findings. Lungs/Pleura: Lungs are clear. No pleural effusion or pneumothorax. Upper Abdomen: No acute abnormality. Musculoskeletal: No chest wall abnormality. No acute or significant osseous findings. Review of the MIP images confirms the above findings. IMPRESSION: No evidence of pulmonary embolism or other acute intrathoracic process. Electronically Signed   By: Greig Pique M.D.   On: 04/06/2024  19:11   CT ABDOMEN PELVIS W CONTRAST Result Date: 04/06/2024 CLINICAL DATA:  Acute aortic syndrome (AAS) suspected, left-sided, sharp, nonradiating chest pain. EXAM: CT ABDOMEN AND PELVIS WITH CONTRAST TECHNIQUE: Multidetector CT imaging of the abdomen and pelvis was performed using the standard protocol following bolus administration of intravenous contrast. RADIATION DOSE REDUCTION: This exam  was performed according to the departmental dose-optimization program which includes automated exposure control, adjustment of the mA and/or kV according to patient size and/or use of iterative reconstruction technique. CONTRAST:  100mL OMNIPAQUE  IOHEXOL  300 MG/ML  SOLN COMPARISON:  None available. FINDINGS: Lower chest: No focal airspace consolidation or pleural effusion.3 mm nodule, partially visualized, in the subpleural right lower lobe (axial 1). Hepatobiliary: 1.7 cm lesion in the lateral right hepatic lobe (axial 23). Posterior left hepatic lobe lesion measuring 1.8 cm (axial 14).6 mm hypodensity in the central aspect of segment 4 (axial 22).No radiopaque stones or wall thickening of the gallbladder. No intrahepatic or extrahepatic biliary ductal dilation. The portal veins are patent. Pancreas: No mass or main ductal dilation. No peripancreatic inflammation or fluid collection. Spleen: Normal size. No mass. Adrenals/Urinary Tract: No adrenal masses. No renal mass. Mild right-sided hydroureteronephrosis. No nephrolithiasis in either kidney. The urinary bladder is distended without focal abnormality. Stomach/Bowel: The stomach is decompressed without focal abnormality. No small bowel wall thickening or inflammation. No small bowel obstruction.Asymmetric wall thickening enhancement in the cecum extending into the appendiceal orifice (axial 50, sagittal 61), worrisome for underlying colonic neoplasm.The appendix is fluid-filled and dilated measuring up to 1.9 cm with periappendiceal inflammation. Vascular/Lymphatic: No  aortic aneurysm. A couple of prominent, right ileocolic chain lymph nodes are present measuring up to 7 mm (axial 50-52). Reproductive: The uterus and ovaries are within normal limits for patient's age.No free pelvic fluid. Other: No pneumoperitoneum, ascites, or mesenteric inflammation. Musculoskeletal: No acute fracture or destructive lesion. Multilevel thoracic osteophytosis. IMPRESSION: 1. Asymmetric wall thickening and enhancement in the cecum, measuring 4.3 cm in craniocaudal dimension, and involving the appendiceal orifice (axial 50, sagittal 61), worrisome for underlying colonic neoplasm. Likely causing obstruction of the appendix, which is fluid-filled and dilated appendix, suggesting acute appendicitis. Surgical consultation recommended. 2. A couple of mildly prominent right ileocolic chain lymph nodes are present measuring up to 7 mm (axial 50-52). These could be reactive or metastatic. 3. There are 3 hypodense lesions in both lobes of the liver, measuring up to 1.8 cm, likely metastatic disease. Critical Value/emergent results were called by telephone at the time of interpretation on 04/06/2024 at 3:00 pm to provider LAMAR PRICE , who verbally acknowledged these results. Electronically Signed   By: Rogelia Myers M.D.   On: 04/06/2024 15:11   DG Chest Port 1 View Result Date: 04/06/2024 EXAM: 1 VIEW(S) XRAY OF THE CHEST 04/06/2024 11:36:23 AM COMPARISON: None available. CLINICAL HISTORY: chest pain. Chest pain FINDINGS: LUNGS AND PLEURA: No focal pulmonary opacity. No pulmonary edema. No pleural effusion. No pneumothorax. HEART AND MEDIASTINUM: No acute abnormality of the cardiac and mediastinal silhouettes. BONES AND SOFT TISSUES: No acute osseous abnormality. IMPRESSION: 1. Normal chest radiograph. Electronically signed by: Lonni Necessary MD 04/06/2024 12:35 PM EDT RP Workstation: [id number]       Impression / Plan:   43 y/o lady with sepsis likely from UTI and found to have cecal mass  on imaging  - plan for colonoscopy once adequately resuscitated from her infection - Dr. Jinny will be covering to make final decision on timing of colonoscopy - further recs after any procedure  Frederic Schick MD, MPH [facility]

## 2024-04-07 NOTE — TOC Initial Note (Signed)
 Transition of Care [facility]) - Initial/Assessment Note    Patient Details  Name: Amber Price MRN: [id number] Date of Birth: 1980-11-21  [facility] CM/SW Contact:    Alfonso Rummer, LCSW Phone Number: 04/07/2024, 9:08 AM  Clinical Narrative:                  KEN DELENA Rummer completed TOC chart review. No needs identified please contact TOC should needs arise.        Patient Goals and CMS Choice            Expected Discharge Plan and Services                                              Prior Living Arrangements/Services                       Activities of Daily Living   ADL Screening (condition at time of admission) Independently performs ADLs?: Yes (appropriate for developmental age)  Permission Sought/Granted                  Emotional Assessment              Admission diagnosis:  UTI (urinary tract infection) [N39.0] Pyelonephritis [N12] Colonic mass [K63.89] Patient Active Problem List   Diagnosis Date Noted   UTI (urinary tract infection) 04/06/2024   Colonic mass 04/06/2024   Metastasis to liver of unknown origin (HCC) 04/06/2024   Appendicitis 04/06/2024   Liver lesion 04/06/2024   Prediabetes 10/12/2023   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 [place] [address] Phone: [phone] Fax: [phone]     Social Drivers of Health  (SDOH) Social History: SDOH Screenings   Food Insecurity: No Food Insecurity (04/06/2024)  Housing: Low Risk  (04/06/2024)  Transportation Needs: No Transportation Needs (04/06/2024)  Utilities: Not At Risk (04/06/2024)  Depression (PHQ2-9): Low Risk  (10/12/2023)  Tobacco Use: Low Risk  (04/06/2024)   SDOH Interventions:     Readmission Risk Interventions     No data to display

## 2024-04-07 NOTE — Plan of Care (Signed)
  Problem: Fluid Volume: Goal: Hemodynamic stability will improve Outcome: Progressing   Problem: Respiratory: Goal: Ability to maintain adequate ventilation will improve Outcome: Progressing   Problem: Education: Goal: Knowledge of General Education information will improve Description: Including pain rating scale, medication(s)/side effects and non-pharmacologic comfort measures Outcome: Progressing   Problem: Health Behavior/Discharge Planning: Goal: Ability to manage health-related needs will improve Outcome: Progressing

## 2024-04-07 NOTE — Plan of Care (Signed)

## 2024-04-07 NOTE — Progress Notes (Signed)
 Progress Note    Amber Price  FMW:[id number] DOB: 1981-01-17  DOA: 04/06/2024 PCP: Fernand Fredy RAMAN, MD      Brief Narrative:    Medical records reviewed and are as summarized below:  Amber Price is a 43 y.o. female with medical history significant for prediabetes, rheumatoid arthritis on methotrexate, anemia, hypertension, vitamin D  deficiency, who presented to the hospital with increased urinary frequency, dysuria, back pain and left-sided chest pain.  She also noticed foul-smelling urine urine output over-the-counter Azo to help with this.  However, there was no improvement in her symptoms.  In the ER, BP 150/90, HR 136, RR 16, O2 saturation 98% on RA,  and Tmax 98.7. Cbc demonstrated wbc 14.4,  hb/hct 11.3/35.3, and platelet 316. Chemistry demonstrated Na 136, K 3.5, Cl 103, bicarb 22, Bun/Cr  9/1.11 and glucose 103.  She had abnormal urinalysis positive for UTI.   CT abdomen and pelvis:  IMPRESSION: 1. Asymmetric wall thickening and enhancement in the cecum, measuring 4.3 cm in craniocaudal dimension, and involving the appendiceal orifice (axial 50, sagittal 61), worrisome for underlying colonic neoplasm. Likely causing obstruction of the appendix, which is fluid-filled and dilated appendix, suggesting acute appendicitis. Surgical consultation recommended. 2. A couple of mildly prominent right ileocolic chain lymph nodes are present measuring up to 7 mm (axial 50-52). These could be reactive or metastatic. 3. There are 3 hypodense lesions in both lobes of the liver, measuring up to 1.8 cm, likely metastatic disease.   She was started on IV fluids and IV antibiotics and the hospitalist team was consulted for admission.    Assessment/Plan:   Principal Problem:   Sepsis due to Escherichia coli (E. 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  (Class II obesity)   Sepsis secondary to E. coli bacteremia and acute UTI: Blood culture showed E. coli (1 out of 4 bottles).  Continue IV ceftriaxone .  Follow-up culture sensitivity report. Sinus tachycardia leukocytosis have improved   Colonic/cecal mass, multiple hepatic lesions suspicious for metastasis: Consulted gastroenterologist for further evaluation. She has been evaluated by general surgeon and no surgical intervention warranted at this time.  No appendicitis. Follow-up with oncologist.   Hypertension: Continue metoprolol    Rheumatoid arthritis: Methotrexate is on hold   Comorbidities include prediabetes, hyperlipidemia, chronic anemia     Diet Order             Diet NPO time specified  Diet effective midnight                                  Consultants: General Surgeon Oncologist Gastroenterologist  Procedures: None    Medications:    enoxaparin  (LOVENOX ) injection  55 mg Subcutaneous Q24H   labetalol   10 mg Intravenous Once   metoprolol  tartrate  12.5 mg Oral BID   Continuous Infusions:  sodium chloride  125 mL/hr at 04/06/24 2200   cefTRIAXone  (ROCEPHIN )  IV 2 g (04/07/24 0633)     Anti-infectives (From admission, onward)    Start     Dose/Rate Route Frequency Ordered Stop   04/07/24 1500  cefTRIAXone  (ROCEPHIN ) 1 g in sodium chloride  0.9 % 100 mL IVPB  Status:  Discontinued        1 g 200 mL/hr over 30 Minutes Intravenous Every 24 hours 04/06/24 1446 04/06/24 1601   04/07/24 0500  cefTRIAXone  (ROCEPHIN ) 2 g in sodium chloride  0.9 % 100 mL IVPB        2 g 200 mL/hr over 30 Minutes Intravenous Every 24 hours 04/07/24 0156     04/06/24 1700  ceFEPIme  (MAXIPIME ) 2 g in sodium chloride  0.9 % 100 mL IVPB  Status:  Discontinued         2 g 200 mL/hr over 30 Minutes Intravenous Every 12 hours 04/06/24 1602 04/07/24 0155   04/06/24 1445  ceFEPIme  (MAXIPIME ) 2 g in sodium chloride  0.9 % 100 mL IVPB  Status:  Discontinued        2 g 200 mL/hr over 30 Minutes Intravenous  Once 04/06/24 1444 04/06/24 1446   04/06/24 1345  cefTRIAXone  (ROCEPHIN ) 1 g in sodium chloride  0.9 % 100 mL IVPB        1 g 200 mL/hr over 30 Minutes Intravenous  Once 04/06/24 1342 04/06/24 1514              Family Communication/Anticipated D/C date and plan/Code Status   DVT prophylaxis:      Code Status: Full Code  Family Communication: Plan discussed with her mother at the bedside Disposition Plan: Plan to discharge home   Status is: Inpatient Remains inpatient appropriate because: E. coli sepsis, bacteremia and UTI       Subjective:   Interval events noted.  No abdominal pain, chest pain or vomiting.  She feels better.  Her mother and son were at the bedside.  Vera, RN, was also at the bedside.  Objective:    Vitals:   04/06/24 1935 04/06/24 2240 04/07/24 0434 04/07/24 1059  BP: (!) 159/87 124/64 (!) 107/59 (!) 154/86  Pulse: (!) 126 70 83 100  Resp: 16 16 16    Temp: 98.4 F (36.9 C) 98.3 F (36.8 C) 98 F (36.7 C) 98.1 F (36.7 C)  TempSrc:  Oral  Oral  SpO2: 99% 96% 98% 98%  Weight:      Height:       No data found.   Intake/Output Summary (Last 24 hours) at 04/07/2024 1133 Last data filed at 04/06/2024 2200 Gross per 24 hour  Intake 1431.08 ml  Output --  Net 1431.08 ml   Filed Weights   04/06/24 1102  Weight: 108 kg    Exam:  GEN: NAD SKIN: Warm and dry EYES: No pallor or icterus ENT: MMM CV: RRR PULM: CTA B ABD: soft, obese, NT, +BS CNS: AAO x 3, non focal EXT: No edema or tenderness GU: No CVA tenderness       Data Reviewed:   I have personally reviewed following labs and imaging studies:  Labs: Labs show the following:   Basic Metabolic Panel: Recent Labs  Lab  04/06/24 1124 04/06/24 1541 04/07/24 0624  NA 136 138 140  K 3.5 3.8 3.6  CL 103 103 109  CO2 22 23 23   GLUCOSE 103* 105* 82  BUN 9 7 9   CREATININE 1.11* 1.03* 0.92  CALCIUM  8.8* 8.6* 8.1*   GFR Estimated Creatinine Clearance: 103.2 mL/min (by C-G formula based on SCr of 0.92 mg/dL). Liver Function Tests: Recent Labs  Lab 04/06/24 1124 04/06/24 1541  AST 16 18  ALT 13 12  ALKPHOS 56 54  BILITOT 0.9 1.1  PROT 7.3 7.3  ALBUMIN 3.3* 3.1*   Recent Labs  Lab 04/06/24 1124  LIPASE 14   No results for input(s): AMMONIA  in the last 168 hours. Coagulation profile Recent Labs  Lab 04/06/24 1511  INR 1.0    CBC: Recent Labs  Lab 04/06/24 1124 04/06/24 1541 04/07/24 0624  WBC 14.4* 14.9* 10.3  NEUTROABS  --  13.4*  --   HGB 11.3* 11.8* 10.3*  HCT 35.3* 35.7* 31.5*  MCV 78.4* 77.4* 77.6*  PLT 316 316 307   Cardiac Enzymes: No results for input(s): CKTOTAL, CKMB, CKMBINDEX, TROPONINI in the last 168 hours. BNP (last 3 results) No results for input(s): PROBNP in the last 8760 hours. CBG: No results for input(s): GLUCAP in the last 168 hours. D-Dimer: No results for input(s): DDIMER in the last 72 hours. Hgb A1c: No results for input(s): HGBA1C in the last 72 hours. Lipid Profile: No results for input(s): CHOL, HDL, LDLCALC, TRIG, CHOLHDL, LDLDIRECT in the last 72 hours. Thyroid  function studies: No results for input(s): TSH, T4TOTAL, T3FREE, THYROIDAB in the last 72 hours.  Invalid input(s): FREET3 Anemia work up: No results for input(s): VITAMINB12, FOLATE, FERRITIN, TIBC, IRON, RETICCTPCT in the last 72 hours. Sepsis Labs: Recent Labs  Lab 04/06/24 1124 04/06/24 1511 04/06/24 1541 04/06/24 1635 04/06/24 1757 04/07/24 0624  WBC 14.4*  --  14.9*  --   --  10.3  LATICACIDVEN  --  1.0 1.2 1.1 1.3  --     Microbiology Recent Results (from the past 240 hours)  Blood culture (routine x 2)     Status:  None (Preliminary result)   Collection Time: 04/06/24  2:24 PM   Specimen: BLOOD  Result Value Ref Range Status   Specimen Description   Final    BLOOD RIGHT ANTECUBITAL Performed at [facility], [address] [place], [place] [address]    Special Requests   Final    BOTTLES DRAWN AEROBIC AND ANAEROBIC Blood Culture results may not be optimal due to an inadequate volume of blood received in culture bottles Performed at [facility], [address], [place], [place] [address]    Culture  Setup Time   Final    GRAM NEGATIVE RODS IN BOTH AEROBIC AND ANAEROBIC BOTTLES CRITICAL VALUE NOTED.  VALUE IS CONSISTENT WITH PREVIOUSLY REPORTED AND CALLED VALUE. Performed at [facility], [address] [place], [place] [address]    Culture GRAM NEGATIVE RODS  Final   Report Status PENDING  Incomplete  Blood culture (routine x 2)     Status: None (Preliminary result)   Collection Time: 04/06/24  2:24 PM   Specimen: BLOOD  Result Value Ref Range Status   Specimen Description   Final    BLOOD BLOOD LEFT HAND Performed at [facility], [address], [place], [place] [address]    Special Requests   Final    BOTTLES DRAWN AEROBIC AND ANAEROBIC Blood Culture results may not be optimal due to an inadequate volume of blood received in culture bottles Performed at [facility], [address], [place], [place] [address]    Culture  Setup Time   Final    GRAM NEGATIVE RODS IN BOTH AEROBIC AND ANAEROBIC BOTTLES Organism ID to follow CRITICAL RESULT CALLED TO, READ BACK BY AND VERIFIED WITH:  JASON ROBBINS AT 0152 04/07/24 JG Performed at [facility], [address] [place], [place] [address]    Culture GRAM NEGATIVE  RODS  Final   Report Status PENDING  Incomplete  Blood Culture ID Panel (Reflexed)     Status: Abnormal   Collection Time: 04/06/24  2:24 PM  Result Value Ref Range Status   Enterococcus faecalis NOT DETECTED NOT  DETECTED Final   Enterococcus Faecium NOT DETECTED NOT DETECTED Final   Listeria monocytogenes NOT DETECTED NOT DETECTED Final   Staphylococcus species NOT DETECTED NOT DETECTED Final   Staphylococcus aureus (BCID) NOT DETECTED NOT DETECTED Final   Staphylococcus epidermidis NOT DETECTED NOT DETECTED Final   Staphylococcus lugdunensis NOT DETECTED NOT DETECTED Final   Streptococcus species NOT DETECTED NOT DETECTED Final   Streptococcus agalactiae NOT DETECTED NOT DETECTED Final   Streptococcus pneumoniae NOT DETECTED NOT DETECTED Final   Streptococcus pyogenes NOT DETECTED NOT DETECTED Final   A.calcoaceticus-baumannii NOT DETECTED NOT DETECTED Final   Bacteroides fragilis NOT DETECTED NOT DETECTED Final   Enterobacterales DETECTED (A) NOT DETECTED Final    Comment: Enterobacterales represent a large order of gram negative bacteria, not a single organism. CRITICAL RESULT CALLED TO, READ BACK BY AND VERIFIED WITH:  JASON ROBBINS AT 0152 04/07/24 JG    Enterobacter cloacae complex NOT DETECTED NOT DETECTED Final   Escherichia coli DETECTED (A) NOT DETECTED Final    Comment: CRITICAL RESULT CALLED TO, READ BACK BY AND VERIFIED WITH:  JASON ROBBINS AT 0152 04/07/24 JG    Klebsiella aerogenes NOT DETECTED NOT DETECTED Final   Klebsiella oxytoca NOT DETECTED NOT DETECTED Final   Klebsiella pneumoniae NOT DETECTED NOT DETECTED Final   Proteus species NOT DETECTED NOT DETECTED Final   Salmonella species NOT DETECTED NOT DETECTED Final   Serratia marcescens NOT DETECTED NOT DETECTED Final   Haemophilus influenzae NOT DETECTED NOT DETECTED Final   Neisseria meningitidis NOT DETECTED NOT DETECTED Final   Pseudomonas aeruginosa NOT DETECTED NOT DETECTED Final   Stenotrophomonas maltophilia NOT DETECTED NOT DETECTED Final   Candida albicans NOT DETECTED NOT DETECTED Final   Candida auris NOT DETECTED NOT DETECTED Final   Candida glabrata NOT DETECTED NOT DETECTED Final   Candida krusei NOT  DETECTED NOT DETECTED Final   Candida parapsilosis NOT DETECTED NOT DETECTED Final   Candida tropicalis NOT DETECTED NOT DETECTED Final   Cryptococcus neoformans/gattii NOT DETECTED NOT DETECTED Final   CTX-M ESBL NOT DETECTED NOT DETECTED Final   Carbapenem resistance IMP NOT DETECTED NOT DETECTED Final   Carbapenem resistance KPC NOT DETECTED NOT DETECTED Final   Carbapenem resistance NDM NOT DETECTED NOT DETECTED Final   Carbapenem resist OXA 48 LIKE NOT DETECTED NOT DETECTED Final   Carbapenem resistance VIM NOT DETECTED NOT DETECTED Final    Comment: Performed at [facility], [address] [place], [place] [address]    Procedures and diagnostic studies:  MR LIVER W WO CONTRAST Result Date: 04/07/2024 EXAM: MRCP WITHOUT AND WITH IV CONTRAST 04/07/2024 09:30:07 AM TECHNIQUE: Multisequence, multiplanar magnetic resonance images of the abdomen without and with intravenous contrast. MRCP sequences were performed. 10 mL gadobutrol  (GADAVIST ) 1 MMOL/ML injection 10 mL GADOBUTROL  1 MMOL/ML IV SOLN was administered. COMPARISON: CT of yesterday. CLINICAL HISTORY: Liver lesion, > 1cm. Followup from CT. Eval liver mets? 10 gad given. No hx ca per patient. FINDINGS: LIVER: No significant steatosis. Mild hepatomegaly at 18.5 cm craniocaudal. Multiple bilateral liver lesions are identified. The most suspicious lesion is in the posterior aspect of segment 2 at 2.0 cm on 23 / 17. demonstrates early and delayed hypoenhancement as well as restricted diffusion on  image 14 / 12. A segment 4b 6 mm hypoenhancing lesion on 38 / 17 is also suspicious for metastasis. Anterior right hepatic lobe 1.3 cm lesion on image 16 / 15 is moderately T2 hyperintense and demonstrates possible peripheral nodular enhancement on image 35 / 19. less distinct on other postcontrast images. Finally, within the Right Hepatic Lobe, there are observations of 1.0 and 1.4 cm on early postcontrast images 38 / 17 and 44 / 17 which are  relatively occult on other pulse sequences favoring hemangiomas. GALLBLADDER AND BILIARY SYSTEM: Gallbladder is unremarkable. No intrahepatic or extrahepatic ductal dilation. SPLEEN: Unremarkable. PANCREAS/PANCREATIC DUCT: Visualized pancreas is unremarkable. No pancreatic ductal dilatation. ADRENAL GLANDS: Unremarkable. KIDNEYS: Unremarkable. LYMPH NODES: Mild ileocolic mesenteric adenopathy at 9 mm on image 44 / 23. VASCULATURE: Unremarkable. PERITONEUM: No ascites. ABDOMINAL WALL: No hernia. No mass. BOWEL: The cecal mass on CT is again identified including on image 44 / 23. BONES: No acute abnormality or worrisome osseous lesion. SOFT TISSUES: MISCELLANEOUS: Unremarkable. IMPRESSION: 1. Multiple bilateral liver lesions. 2 left hepatic lobe lesions are highly suspicious for metastasis. 1 right liver lobe lesion is indeterminate but favored to represent a hemangioma. 2 smaller right liver lobe lesions are favored to be benign, likely hemangiomas. Consider sampling of the segment 2 lesion versus further evaluation with PET. 2.  Cecal mass with adjacent adenopathy, suspicious for nodal metastasis. Electronically signed by: Rockey Kilts MD 04/07/2024 10:10 AM EDT RP Workstation: [id number]   CT Angio Chest Pulmonary Embolism (PE) W or WO Contrast Result Date: 04/06/2024 CLINICAL DATA:  Left-sided chest pain. EXAM: CT ANGIOGRAPHY CHEST WITH CONTRAST TECHNIQUE: Multidetector CT imaging of the chest was performed using the standard protocol during bolus administration of intravenous contrast. Multiplanar CT image reconstructions and MIPs were obtained to evaluate the vascular anatomy. RADIATION DOSE REDUCTION: This exam was performed according to the departmental dose-optimization program which includes automated exposure control, adjustment of the mA and/or kV according to patient size and/or use of iterative reconstruction technique. CONTRAST:  75mL OMNIPAQUE  IOHEXOL  350 MG/ML SOLN COMPARISON:  None Available.  FINDINGS: Cardiovascular: Satisfactory opacification of the pulmonary arteries to the segmental level. No evidence of pulmonary embolism. Normal heart size. No pericardial effusion. Mediastinum/Nodes: No enlarged mediastinal, hilar, or axillary lymph nodes. Thyroid  gland, trachea, and esophagus demonstrate no significant findings. Lungs/Pleura: Lungs are clear. No pleural effusion or pneumothorax. Upper Abdomen: No acute abnormality. Musculoskeletal: No chest wall abnormality. No acute or significant osseous findings. Review of the MIP images confirms the above findings. IMPRESSION: No evidence of pulmonary embolism or other acute intrathoracic process. Electronically Signed   By: Greig Pique M.D.   On: 04/06/2024 19:11   CT ABDOMEN PELVIS W CONTRAST Result Date: 04/06/2024 CLINICAL DATA:  Acute aortic syndrome (AAS) suspected, left-sided, sharp, nonradiating chest pain. EXAM: CT ABDOMEN AND PELVIS WITH CONTRAST TECHNIQUE: Multidetector CT imaging of the abdomen and pelvis was performed using the standard protocol following bolus administration of intravenous contrast. RADIATION DOSE REDUCTION: This exam was performed according to the departmental dose-optimization program which includes automated exposure control, adjustment of the mA and/or kV according to patient size and/or use of iterative reconstruction technique. CONTRAST:  100mL OMNIPAQUE  IOHEXOL  300 MG/ML  SOLN COMPARISON:  None available. FINDINGS: Lower chest: No focal airspace consolidation or pleural effusion.3 mm nodule, partially visualized, in the subpleural right lower lobe (axial 1). Hepatobiliary: 1.7 cm lesion in the lateral right hepatic lobe (axial 23). Posterior left hepatic lobe lesion measuring 1.8 cm (axial 14).6 mm hypodensity in  the central aspect of segment 4 (axial 22).No radiopaque stones or wall thickening of the gallbladder. No intrahepatic or extrahepatic biliary ductal dilation. The portal veins are patent. Pancreas: No mass  or main ductal dilation. No peripancreatic inflammation or fluid collection. Spleen: Normal size. No mass. Adrenals/Urinary Tract: No adrenal masses. No renal mass. Mild right-sided hydroureteronephrosis. No nephrolithiasis in either kidney. The urinary bladder is distended without focal abnormality. Stomach/Bowel: The stomach is decompressed without focal abnormality. No small bowel wall thickening or inflammation. No small bowel obstruction.Asymmetric wall thickening enhancement in the cecum extending into the appendiceal orifice (axial 50, sagittal 61), worrisome for underlying colonic neoplasm.The appendix is fluid-filled and dilated measuring up to 1.9 cm with periappendiceal inflammation. Vascular/Lymphatic: No aortic aneurysm. A couple of prominent, right ileocolic chain lymph nodes are present measuring up to 7 mm (axial 50-52). Reproductive: The uterus and ovaries are within normal limits for patient's age.No free pelvic fluid. Other: No pneumoperitoneum, ascites, or mesenteric inflammation. Musculoskeletal: No acute fracture or destructive lesion. Multilevel thoracic osteophytosis. IMPRESSION: 1. Asymmetric wall thickening and enhancement in the cecum, measuring 4.3 cm in craniocaudal dimension, and involving the appendiceal orifice (axial 50, sagittal 61), worrisome for underlying colonic neoplasm. Likely causing obstruction of the appendix, which is fluid-filled and dilated appendix, suggesting acute appendicitis. Surgical consultation recommended. 2. A couple of mildly prominent right ileocolic chain lymph nodes are present measuring up to 7 mm (axial 50-52). These could be reactive or metastatic. 3. There are 3 hypodense lesions in both lobes of the liver, measuring up to 1.8 cm, likely metastatic disease. Critical Value/emergent results were called by telephone at the time of interpretation on 04/06/2024 at 3:00 pm to provider LAMAR PRICE , who verbally acknowledged these results. Electronically  Signed   By: Rogelia Myers M.D.   On: 04/06/2024 15:11   DG Chest Port 1 View Result Date: 04/06/2024 EXAM: 1 VIEW(S) XRAY OF THE CHEST 04/06/2024 11:36:23 AM COMPARISON: None available. CLINICAL HISTORY: chest pain. Chest pain FINDINGS: LUNGS AND PLEURA: No focal pulmonary opacity. No pulmonary edema. No pleural effusion. No pneumothorax. HEART AND MEDIASTINUM: No acute abnormality of the cardiac and mediastinal silhouettes. BONES AND SOFT TISSUES: No acute osseous abnormality. IMPRESSION: 1. Normal chest radiograph. Electronically signed by: Lonni Necessary MD 04/06/2024 12:35 PM EDT RP Workstation: [id number]               LOS: 1 day   Keoni Havey  [facility]   Pager on [email or web address]. If 7PM-7AM, please contact night-coverage at [email or web address]     04/07/2024, 11:33 AM

## 2024-04-07 NOTE — Progress Notes (Addendum)
 [facility] SURGICAL PROGRESS NOTE (cpt [phone])  Hospital Day(s): 1.   Interval History: Patient seen and examined, no acute events or new complaints overnight. Patient reports she is doing well. Denied any abdominal pain. She is up to the bathroom to wash up. No fever, chills, nausea, emesis.Her previous leukocytosis is resolved; WBC 10.3K. Hgb to 10.3. Renal function normalized; sCr - 0.92; UO - unmeasured. No electrolyte derangements. She is NPO. She underwent MRI this AM.   Review of Systems:  Constitutional: denies fever, chills  HEENT: denies cough or congestion  Respiratory: denies any shortness of breath  Cardiovascular: denies chest pain or palpitations  Gastrointestinal: denies abdominal pain, N/V Genitourinary: denies burning with urination or urinary frequency Musculoskeletal: denies pain, decreased motor or sensation  Vital signs in last 24 hours: [min-max] current  Temp:  [98 F (36.7 C)-98.7 F (37.1 C)] 98 F (36.7 C) (09/25 0434) Pulse Rate:  [70-146] 83 (09/25 0434) Resp:  [16-28] 16 (09/25 0434) BP: (97-159)/(59-97) 107/59 (09/25 0434) SpO2:  [96 %-100 %] 98 % (09/25 0434) Weight:  [891 kg] 108 kg (09/24 1102)     Height: 5' 9 (175.3 cm) Weight: 108 kg BMI (Calculated): 35.13   Intake/Output last 2 shifts:  09/24 0701 - 09/25 0700 In: 1431.1 [I.V.:1331.1; IV Piggyback:100] Out: -    Physical Exam:  Constitutional: alert, cooperative and no distress  HENT: normocephalic without obvious abnormality  Eyes: PERRL, EOM's grossly intact and symmetric  Respiratory: breathing non-labored at rest  Cardiovascular: regular rate and sinus rhythm  Gastrointestinal: soft, non-tender, and non-distended, no rebound/guarding Musculoskeletal: no edema or wounds, motor and sensation grossly intact, NT    Labs:     Latest Ref Rng & Units 04/07/2024    6:24 AM 04/06/2024    3:41 PM 04/06/2024   11:24 AM  CBC  WBC 4.0 - 10.5 K/uL 10.3  14.9  14.4    Hemoglobin 12.0 - 15.0 g/dL 89.6  88.1  88.6   Hematocrit 36.0 - 46.0 % 31.5  35.7  35.3   Platelets 150 - 400 K/uL 307  316  316       Latest Ref Rng & Units 04/07/2024    6:24 AM 04/06/2024    3:41 PM 04/06/2024   11:24 AM  CMP  Glucose 70 - 99 mg/dL 82  894  896   BUN 6 - 20 mg/dL 9  7  9    Creatinine 0.44 - 1.00 mg/dL 9.07  8.96  8.88   Sodium 135 - 145 mmol/L 140  138  136   Potassium 3.5 - 5.1 mmol/L 3.6  3.8  3.5   Chloride 98 - 111 mmol/L 109  103  103   CO2 22 - 32 mmol/L 23  23  22    Calcium  8.9 - 10.3 mg/dL 8.1  8.6  8.8   Total Protein 6.5 - 8.1 g/dL  7.3  7.3   Total Bilirubin 0.0 - 1.2 mg/dL  1.1  0.9   Alkaline Phos 38 - 126 U/L  54  56   AST 15 - 41 U/L  18  16   ALT 0 - 44 U/L  12  13      Imaging studies:  MRI (04/07/2024) personally reviewed and agree with radiologist report reviewed below:  IMPRESSION: 1. Multiple bilateral liver lesions. 2 left hepatic lobe lesions are highly suspicious for metastasis. 1 right liver lobe lesion is indeterminate but favored to represent a hemangioma. 2 smaller right liver lobe  lesions are favored to be benign, likely hemangiomas. Consider sampling of the segment 2 lesion versus further evaluation with PET. 2.  Cecal mass with adjacent adenopathy, suspicious for nodal metastasis.   Assessment/Plan:  43 y.o. female incidentally found to have possible cecal mass with associated chronic appendix changes   - She is without any complicating factors (ie: perforation, obstruction) to warrant surgical intervention at this time - Agree with GI consultation for endoscopic evaluation of this mass +/- biopsy; Appreciate their assistance   - Appreciate oncology evaluation; CEA pending  - Monitor abdominal examination   - Further management per primary service; we will follow    All of the above findings and recommendations were discussed with the patient, patient's family, and the medical team, and all of patient's and family's  questions were answered to their expressed satisfaction.   -- Arthea Platt, PA-C [facility] 04/07/2024, 9:57 AM M-F: 7am - 4pm

## 2024-04-07 NOTE — Progress Notes (Signed)
 Amber Copping, MD [facility]   [address], [address] [place], [place] [address] Phone: [phone] Fax : [phone]   Subjective: The patient was seen by Dr. Maryruth for consultation yesterday and had an MRI this morning.  The MRI shows the cecal mass with associated lymph nodes and lesions of the liver consistent with metastases.   Objective: Vital signs in last 24 hours: Vitals:   04/06/24 1935 04/06/24 2240 04/07/24 0434 04/07/24 1059  BP: (!) 159/87 124/64 (!) 107/59 (!) 154/86  Pulse: (!) 126 70 83 100  Resp: 16 16 16    Temp: 98.4 F (36.9 C) 98.3 F (36.8 C) 98 F (36.7 C) 98.1 F (36.7 C)  TempSrc:  Oral  Oral  SpO2: 99% 96% 98% 98%  Weight:      Height:       Weight change:   Intake/Output Summary (Last 24 hours) at 04/07/2024 1403 Last data filed at 04/06/2024 2200 Gross per 24 hour  Intake 1431.08 ml  Output --  Net 1431.08 ml     Exam: General: The patient laying in bed in no apparent distress alert and oriented x 3   Lab Results: @LABTEST2 @ Micro Results: Recent Results (from the past 240 hours)  Blood culture (routine x 2)     Status: None (Preliminary result)   Collection Time: 04/06/24  2:24 PM   Specimen: BLOOD  Result Value Ref Range Status   Specimen Description   Final    BLOOD RIGHT ANTECUBITAL Performed at [facility], [address] [place], [place] [address]    Special Requests   Final    BOTTLES DRAWN AEROBIC AND ANAEROBIC Blood Culture results may not be optimal due to an inadequate volume of blood received in culture bottles Performed at [facility], [address] [place], [place] [address]    Culture  Setup Time   Final    GRAM NEGATIVE RODS IN BOTH AEROBIC AND ANAEROBIC BOTTLES CRITICAL VALUE NOTED.  VALUE IS CONSISTENT WITH PREVIOUSLY REPORTED AND CALLED VALUE. Performed at [facility], [address] [place], [place] [address]    Culture GRAM NEGATIVE RODS  Final   Report Status  PENDING  Incomplete  Blood culture (routine x 2)     Status: None (Preliminary result)   Collection Time: 04/06/24  2:24 PM   Specimen: BLOOD  Result Value Ref Range Status   Specimen Description   Final    BLOOD BLOOD LEFT HAND Performed at [facility], [address], [place], [place] [address]    Special Requests   Final    BOTTLES DRAWN AEROBIC AND ANAEROBIC Blood Culture results may not be optimal due to an inadequate volume of blood received in culture bottles Performed at [facility], [address] [place], [place] [address]    Culture  Setup Time   Final    GRAM NEGATIVE RODS IN BOTH AEROBIC AND ANAEROBIC BOTTLES Organism ID to follow CRITICAL RESULT CALLED TO, READ BACK BY AND VERIFIED WITHBETHA SELINDA SIMPERS AT 9847 04/07/24 JG Performed at [facility], [address] [place], [place] [address]    Culture GRAM NEGATIVE RODS  Final   Report Status PENDING  Incomplete  Blood Culture ID Panel (Reflexed)     Status: Abnormal   Collection Time: 04/06/24  2:24 PM  Result Value Ref Range Status   Enterococcus faecalis NOT DETECTED NOT DETECTED Final   Enterococcus Faecium NOT DETECTED NOT DETECTED Final  Listeria monocytogenes NOT DETECTED NOT DETECTED Final   Staphylococcus species NOT DETECTED NOT DETECTED Final   Staphylococcus aureus (BCID) NOT DETECTED NOT DETECTED Final   Staphylococcus epidermidis NOT DETECTED NOT DETECTED Final   Staphylococcus lugdunensis NOT DETECTED NOT DETECTED Final   Streptococcus species NOT DETECTED NOT DETECTED Final   Streptococcus agalactiae NOT DETECTED NOT DETECTED Final   Streptococcus pneumoniae NOT DETECTED NOT DETECTED Final   Streptococcus pyogenes NOT DETECTED NOT DETECTED Final   A.calcoaceticus-baumannii NOT DETECTED NOT DETECTED Final   Bacteroides fragilis NOT DETECTED NOT DETECTED Final   Enterobacterales DETECTED (A) NOT DETECTED Final    Comment: Enterobacterales represent a large order of  gram negative bacteria, not a single organism. CRITICAL RESULT CALLED TO, READ BACK BY AND VERIFIED WITH:  JASON ROBBINS AT 0152 04/07/24 JG    Enterobacter cloacae complex NOT DETECTED NOT DETECTED Final   Escherichia coli DETECTED (A) NOT DETECTED Final    Comment: CRITICAL RESULT CALLED TO, READ BACK BY AND VERIFIED WITH:  JASON ROBBINS AT 0152 04/07/24 JG    Klebsiella aerogenes NOT DETECTED NOT DETECTED Final   Klebsiella oxytoca NOT DETECTED NOT DETECTED Final   Klebsiella pneumoniae NOT DETECTED NOT DETECTED Final   Proteus species NOT DETECTED NOT DETECTED Final   Salmonella species NOT DETECTED NOT DETECTED Final   Serratia marcescens NOT DETECTED NOT DETECTED Final   Haemophilus influenzae NOT DETECTED NOT DETECTED Final   Neisseria meningitidis NOT DETECTED NOT DETECTED Final   Pseudomonas aeruginosa NOT DETECTED NOT DETECTED Final   Stenotrophomonas maltophilia NOT DETECTED NOT DETECTED Final   Candida albicans NOT DETECTED NOT DETECTED Final   Candida auris NOT DETECTED NOT DETECTED Final   Candida glabrata NOT DETECTED NOT DETECTED Final   Candida krusei NOT DETECTED NOT DETECTED Final   Candida parapsilosis NOT DETECTED NOT DETECTED Final   Candida tropicalis NOT DETECTED NOT DETECTED Final   Cryptococcus neoformans/gattii NOT DETECTED NOT DETECTED Final   CTX-M ESBL NOT DETECTED NOT DETECTED Final   Carbapenem resistance IMP NOT DETECTED NOT DETECTED Final   Carbapenem resistance KPC NOT DETECTED NOT DETECTED Final   Carbapenem resistance NDM NOT DETECTED NOT DETECTED Final   Carbapenem resist OXA 48 LIKE NOT DETECTED NOT DETECTED Final   Carbapenem resistance VIM NOT DETECTED NOT DETECTED Final    Comment: Performed at [facility], [address] [place], [place] [address]   Studies/Results: MR LIVER W WO CONTRAST Result Date: 04/07/2024 EXAM: MRCP WITHOUT AND WITH IV CONTRAST 04/07/2024 09:30:07 AM TECHNIQUE: Multisequence, multiplanar magnetic  resonance images of the abdomen without and with intravenous contrast. MRCP sequences were performed. 10 mL gadobutrol  (GADAVIST ) 1 MMOL/ML injection 10 mL GADOBUTROL  1 MMOL/ML IV SOLN was administered. COMPARISON: CT of yesterday. CLINICAL HISTORY: Liver lesion, > 1cm. Followup from CT. Eval liver mets? 10 gad given. No hx ca per patient. FINDINGS: LIVER: No significant steatosis. Mild hepatomegaly at 18.5 cm craniocaudal. Multiple bilateral liver lesions are identified. The most suspicious lesion is in the posterior aspect of segment 2 at 2.0 cm on 23 / 17. demonstrates early and delayed hypoenhancement as well as restricted diffusion on image 14 / 12. A segment 4b 6 mm hypoenhancing lesion on 38 / 17 is also suspicious for metastasis. Anterior right hepatic lobe 1.3 cm lesion on image 16 / 15 is moderately T2 hyperintense and demonstrates possible peripheral nodular enhancement on image 35 / 19. less distinct on other postcontrast images. Finally, within the Right Hepatic Lobe, there are  observations of 1.0 and 1.4 cm on early postcontrast images 38 / 17 and 44 / 17 which are relatively occult on other pulse sequences favoring hemangiomas. GALLBLADDER AND BILIARY SYSTEM: Gallbladder is unremarkable. No intrahepatic or extrahepatic ductal dilation. SPLEEN: Unremarkable. PANCREAS/PANCREATIC DUCT: Visualized pancreas is unremarkable. No pancreatic ductal dilatation. ADRENAL GLANDS: Unremarkable. KIDNEYS: Unremarkable. LYMPH NODES: Mild ileocolic mesenteric adenopathy at 9 mm on image 44 / 23. VASCULATURE: Unremarkable. PERITONEUM: No ascites. ABDOMINAL WALL: No hernia. No mass. BOWEL: The cecal mass on CT is again identified including on image 44 / 23. BONES: No acute abnormality or worrisome osseous lesion. SOFT TISSUES: MISCELLANEOUS: Unremarkable. IMPRESSION: 1. Multiple bilateral liver lesions. 2 left hepatic lobe lesions are highly suspicious for metastasis. 1 right liver lobe lesion is indeterminate but  favored to represent a hemangioma. 2 smaller right liver lobe lesions are favored to be benign, likely hemangiomas. Consider sampling of the segment 2 lesion versus further evaluation with PET. 2.  Cecal mass with adjacent adenopathy, suspicious for nodal metastasis. Electronically signed by: Rockey Kilts MD 04/07/2024 10:10 AM EDT RP Workstation: [id number]   CT Angio Chest Pulmonary Embolism (PE) W or WO Contrast Result Date: 04/06/2024 CLINICAL DATA:  Left-sided chest pain. EXAM: CT ANGIOGRAPHY CHEST WITH CONTRAST TECHNIQUE: Multidetector CT imaging of the chest was performed using the standard protocol during bolus administration of intravenous contrast. Multiplanar CT image reconstructions and MIPs were obtained to evaluate the vascular anatomy. RADIATION DOSE REDUCTION: This exam was performed according to the departmental dose-optimization program which includes automated exposure control, adjustment of the mA and/or kV according to patient size and/or use of iterative reconstruction technique. CONTRAST:  75mL OMNIPAQUE  IOHEXOL  350 MG/ML SOLN COMPARISON:  None Available. FINDINGS: Cardiovascular: Satisfactory opacification of the pulmonary arteries to the segmental level. No evidence of pulmonary embolism. Normal heart size. No pericardial effusion. Mediastinum/Nodes: No enlarged mediastinal, hilar, or axillary lymph nodes. Thyroid  gland, trachea, and esophagus demonstrate no significant findings. Lungs/Pleura: Lungs are clear. No pleural effusion or pneumothorax. Upper Abdomen: No acute abnormality. Musculoskeletal: No chest wall abnormality. No acute or significant osseous findings. Review of the MIP images confirms the above findings. IMPRESSION: No evidence of pulmonary embolism or other acute intrathoracic process. Electronically Signed   By: Greig Pique M.D.   On: 04/06/2024 19:11   CT ABDOMEN PELVIS W CONTRAST Result Date: 04/06/2024 CLINICAL DATA:  Acute aortic syndrome (AAS) suspected,  left-sided, sharp, nonradiating chest pain. EXAM: CT ABDOMEN AND PELVIS WITH CONTRAST TECHNIQUE: Multidetector CT imaging of the abdomen and pelvis was performed using the standard protocol following bolus administration of intravenous contrast. RADIATION DOSE REDUCTION: This exam was performed according to the departmental dose-optimization program which includes automated exposure control, adjustment of the mA and/or kV according to patient size and/or use of iterative reconstruction technique. CONTRAST:  100mL OMNIPAQUE  IOHEXOL  300 MG/ML  SOLN COMPARISON:  None available. FINDINGS: Lower chest: No focal airspace consolidation or pleural effusion.3 mm nodule, partially visualized, in the subpleural right lower lobe (axial 1). Hepatobiliary: 1.7 cm lesion in the lateral right hepatic lobe (axial 23). Posterior left hepatic lobe lesion measuring 1.8 cm (axial 14).6 mm hypodensity in the central aspect of segment 4 (axial 22).No radiopaque stones or wall thickening of the gallbladder. No intrahepatic or extrahepatic biliary ductal dilation. The portal veins are patent. Pancreas: No mass or main ductal dilation. No peripancreatic inflammation or fluid collection. Spleen: Normal size. No mass. Adrenals/Urinary Tract: No adrenal masses. No renal mass. Mild right-sided hydroureteronephrosis. No nephrolithiasis in either  kidney. The urinary bladder is distended without focal abnormality. Stomach/Bowel: The stomach is decompressed without focal abnormality. No small bowel wall thickening or inflammation. No small bowel obstruction.Asymmetric wall thickening enhancement in the cecum extending into the appendiceal orifice (axial 50, sagittal 61), worrisome for underlying colonic neoplasm.The appendix is fluid-filled and dilated measuring up to 1.9 cm with periappendiceal inflammation. Vascular/Lymphatic: No aortic aneurysm. A couple of prominent, right ileocolic chain lymph nodes are present measuring up to 7 mm (axial  50-52). Reproductive: The uterus and ovaries are within normal limits for patient's age.No free pelvic fluid. Other: No pneumoperitoneum, ascites, or mesenteric inflammation. Musculoskeletal: No acute fracture or destructive lesion. Multilevel thoracic osteophytosis. IMPRESSION: 1. Asymmetric wall thickening and enhancement in the cecum, measuring 4.3 cm in craniocaudal dimension, and involving the appendiceal orifice (axial 50, sagittal 61), worrisome for underlying colonic neoplasm. Likely causing obstruction of the appendix, which is fluid-filled and dilated appendix, suggesting acute appendicitis. Surgical consultation recommended. 2. A couple of mildly prominent right ileocolic chain lymph nodes are present measuring up to 7 mm (axial 50-52). These could be reactive or metastatic. 3. There are 3 hypodense lesions in both lobes of the liver, measuring up to 1.8 cm, likely metastatic disease. Critical Value/emergent results were called by telephone at the time of interpretation on 04/06/2024 at 3:00 pm to provider LAMAR PRICE , who verbally acknowledged these results. Electronically Signed   By: Amber Myers M.D.   On: 04/06/2024 15:11   DG Chest Port 1 View Result Date: 04/06/2024 EXAM: 1 VIEW(S) XRAY OF THE CHEST 04/06/2024 11:36:23 AM COMPARISON: None available. CLINICAL HISTORY: chest pain. Chest pain FINDINGS: LUNGS AND PLEURA: No focal pulmonary opacity. No pulmonary edema. No pleural effusion. No pneumothorax. HEART AND MEDIASTINUM: No acute abnormality of the cardiac and mediastinal silhouettes. BONES AND SOFT TISSUES: No acute osseous abnormality. IMPRESSION: 1. Normal chest radiograph. Electronically signed by: Lonni Necessary MD 04/06/2024 12:35 PM EDT RP Workstation: [id number]   Medications: I have reviewed the patient's current medications. Scheduled Meds:  enoxaparin  (LOVENOX ) injection  55 mg Subcutaneous Q24H   labetalol   10 mg Intravenous Once   metoprolol  tartrate  12.5 mg Oral  BID   Continuous Infusions:  cefTRIAXone  (ROCEPHIN )  IV 2 g (04/07/24 0633)   PRN Meds:.acetaminophen  **OR** acetaminophen , HYDROcodone -acetaminophen , morphine  injection, ondansetron  **OR** ondansetron  (ZOFRAN ) IV, senna-docusate   Assessment: Principal Problem:   Sepsis due to Escherichia coli (E. 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  The patient will be set up for a colonoscopy tomorrow.  The patient will be given a prep today.  The patient and her mother have been explained the plan and agree with it.   LOS: 1 day   Amber Copping, MD.FACG 04/07/2024, 2:03 PM Pager [phone] 7am-5pm  Check AMION for 5pm -7am coverage and on weekends

## 2024-04-08 ENCOUNTER — Inpatient Hospital Stay: Admitting: Registered Nurse

## 2024-04-08 ENCOUNTER — Encounter
Admission: EM | Disposition: A | Discharge status: Home / Self Care | Payer: Self-pay | Source: Ambulatory Visit | Attending: Internal Medicine

## 2024-04-08 ENCOUNTER — Telehealth: Payer: Self-pay

## 2024-04-08 DIAGNOSIS — K6389 Other specified diseases of intestine: Secondary | ICD-10-CM | POA: Diagnosis not present

## 2024-04-08 DIAGNOSIS — C180 Malignant neoplasm of cecum: Secondary | ICD-10-CM

## 2024-04-08 DIAGNOSIS — D490 Neoplasm of unspecified behavior of digestive system: Secondary | ICD-10-CM

## 2024-04-08 DIAGNOSIS — N3000 Acute cystitis without hematuria: Secondary | ICD-10-CM | POA: Diagnosis not present

## 2024-04-08 DIAGNOSIS — K5669 Other partial intestinal obstruction: Secondary | ICD-10-CM

## 2024-04-08 DIAGNOSIS — R933 Abnormal findings on diagnostic imaging of other parts of digestive tract: Secondary | ICD-10-CM

## 2024-04-08 DIAGNOSIS — A4151 Sepsis due to Escherichia coli [E. coli]: Secondary | ICD-10-CM | POA: Diagnosis not present

## 2024-04-08 DIAGNOSIS — K769 Liver disease, unspecified: Secondary | ICD-10-CM | POA: Diagnosis not present

## 2024-04-08 DIAGNOSIS — K36 Other appendicitis: Secondary | ICD-10-CM | POA: Diagnosis not present

## 2024-04-08 HISTORY — PX: COLONOSCOPY: SHX5424

## 2024-04-08 LAB — IRON AND TIBC
Iron: 20 ug/dL — ABNORMAL LOW (ref 28–170)
Saturation Ratios: 5 % — ABNORMAL LOW (ref 10.4–31.8)
TIBC: 377 ug/dL (ref 250–450)
UIBC: 357 ug/dL

## 2024-04-08 LAB — URINE CULTURE: Culture: >=100000 — AB

## 2024-04-08 LAB — FERRITIN: Ferritin: 63 ng/mL (ref 11–307)

## 2024-04-08 SURGERY — COLONOSCOPY
Anesthesia: General

## 2024-04-08 MED ORDER — LEVOFLOXACIN 750 MG PO TABS: 750.0000 mg | ORAL_TABLET | Freq: Every day | ORAL | 0 refills | Status: AC

## 2024-04-08 MED ORDER — PROPOFOL 500 MG/50ML IV EMUL
INTRAVENOUS | Status: DC | PRN
  Administered 2024-04-08: 70 mg via INTRAVENOUS
  Administered 2024-04-08: 150 ug/kg/min via INTRAVENOUS
  Administered 2024-04-08: 30 mg via INTRAVENOUS

## 2024-04-08 MED ORDER — LIDOCAINE HCL (CARDIAC) PF 100 MG/5ML IV SOSY
PREFILLED_SYRINGE | INTRAVENOUS | Status: DC | PRN
  Administered 2024-04-08: 100 mg via INTRAVENOUS

## 2024-04-08 MED ORDER — DEXMEDETOMIDINE HCL IN NACL 80 MCG/20ML IV SOLN
INTRAVENOUS | Status: DC | PRN
  Administered 2024-04-08: 8 ug via INTRAVENOUS

## 2024-04-08 MED ORDER — SODIUM CHLORIDE 0.9 % IV SOLN: INTRAVENOUS | Status: DC

## 2024-04-08 NOTE — Op Note (Addendum)
 [facility] Patient Name: Amber Price Procedure Date: 04/08/2024 9:23 AM MRN: [id number] Account #: [id number] Date of Birth: September 21, 1980 Admit Type: Inpatient Age: 43 Room: [facility] ENDO ROOM 4 Gender: Female Note Status: Finalized Instrument Name: Colon Scope [id number] Procedure:             Colonoscopy Indications:           Abnormal CT of the GI tract Providers:             Rogelia Copping MD, MD Referring MD:          Fredy CANDIE Bathe, MD (Referring MD) Medicines:             Propofol  per Anesthesia Complications:         No immediate complications. Procedure:             Pre-Anesthesia Assessment:                        - Prior to the procedure, a History and Physical was                         performed, and patient medications and allergies were                         reviewed. The patient's tolerance of previous                         anesthesia was also reviewed. The risks and benefits                         of the procedure and the sedation options and risks                         were discussed with the patient. All questions were                         answered, and informed consent was obtained. Prior                         Anticoagulants: The patient has taken no anticoagulant                         or antiplatelet agents. ASA Grade Assessment: II - A                         patient with mild systemic disease. After reviewing                         the risks and benefits, the patient was deemed in                         satisfactory condition to undergo the procedure.                        After obtaining informed consent, the colonoscope was                         passed under direct vision. Throughout the procedure,  the patient's blood pressure, pulse, and oxygen                         saturations were monitored continuously. The                         Colonoscope was introduced through the anus and                          advanced to the the cecum, identified by its                         appearance. The colonoscopy was performed without                         difficulty. The patient tolerated the procedure well.                         The quality of the bowel preparation was excellent. Findings:      The perianal and digital rectal examinations were normal.      A fungating partially obstructing large mass was found in the cecum. The       mass was circumferential. No bleeding was present. Biopsies were taken       with a cold forceps for histology. 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 Biopsied. Recommendation:        - Return patient to hospital ward for ongoing care.                        - Resume previous diet.                        - Continue present medications.                        - Await pathology results.                        - Refer to a Careers adviser. Procedure Code(s):     --- Professional ---                        [phone], Colonoscopy, flexible; with biopsy, single or                         multiple Diagnosis Code(s):     --- Professional ---                        R93.3, Abnormal findings on diagnostic imaging of                         other parts of digestive tract                        D49.0, Neoplasm of unspecified behavior of digestive  system CPT copyright 2022 American [employer] Association. All rights reserved. The codes documented in this report are preliminary and upon coder review may  be revised to meet current compliance requirements. Rogelia Copping MD, MD 04/08/2024 9:45:17 AM This report has been signed electronically. Number of Addenda: 0 Note Initiated On: 04/08/2024 9:23 AM Scope Withdrawal Time: 0 hours 6 minutes 52 seconds  Total Procedure Duration: 0 hours 9 minutes 40 seconds  Estimated Blood Loss:  Estimated blood loss: none.      [facility]

## 2024-04-08 NOTE — Anesthesia Postprocedure Evaluation (Signed)
 Anesthesia Post Note  Patient: Amber Price  Procedure(s) Performed: COLONOSCOPY  Patient location during evaluation: PACU Anesthesia Type: General Level of consciousness: awake Pain management: satisfactory to patient Respiratory status: nonlabored ventilation Cardiovascular status: blood pressure returned to baseline Anesthetic complications: no   No notable events documented.   Last Vitals:  Vitals:   04/08/24 0957 04/08/24 1007  BP: 138/70 (!) 152/82  Pulse: 89 98  Resp: (!) 23 18  Temp:    SpO2: 98% 99%    Last Pain:  Vitals:   04/08/24 1045  TempSrc:   PainSc: 0-No pain                 VAN STAVEREN,[place]myra Rogacki

## 2024-04-08 NOTE — Progress Notes (Signed)
 Discharge instructions given to patient, questions answered. IV removed without complications. Patient transporting home via car by family.

## 2024-04-08 NOTE — Discharge Summary (Signed)
 Physician Discharge Summary   Patient: Amber Price MRN: [id number] DOB: 05-Aug-1980  Admit date:     04/06/2024  Discharge date: 04/08/24  Discharge Physician: AIDA CHO   PCP: Fernand Fredy RAMAN, MD   Recommendations at discharge:   Follow-up with PCP in 1 week Follow-up with Dr. Babara, oncologist, within 1 week of discharge  Discharge Diagnoses: Principal Problem:   Sepsis due to Escherichia coli (E. 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 *  Hospital Course:   Amber Price is a 43 y.o. female with medical history significant for prediabetes, rheumatoid arthritis on methotrexate, anemia, hypertension, vitamin D  deficiency, who presented to the hospital with increased urinary frequency, dysuria, back pain and left-sided chest pain.  She also noticed foul-smelling urine urine output over-the-counter Azo to help with this.  However, there was no improvement in her symptoms.   In the ER, BP 150/90, HR 136, RR 16, O2 saturation 98% on RA,  and Tmax 98.7. Cbc demonstrated wbc 14.4,  hb/hct 11.3/35.3, and platelet 316. Chemistry demonstrated Na 136, K 3.5, Cl 103, bicarb 22, Bun/Cr  9/1.11 and glucose 103.  She had abnormal urinalysis positive for UTI.     CT abdomen and pelvis:   IMPRESSION: 1. Asymmetric wall thickening and enhancement in the cecum, measuring 4.3 cm in craniocaudal dimension, and involving the appendiceal orifice (axial 50, sagittal 61), worrisome for underlying colonic neoplasm. Likely causing obstruction of the appendix, which is fluid-filled and dilated appendix, suggesting acute  appendicitis. Surgical consultation recommended. 2. A couple of mildly prominent right ileocolic chain lymph nodes are present measuring up to 7 mm (axial 50-52). These could be reactive or metastatic. 3. There are 3 hypodense lesions in both lobes of the liver, measuring up to 1.8 cm, likely metastatic disease.     She was started on IV fluids and IV antibiotics and the hospitalist team was consulted for admission.      Assessment and Plan:   Sepsis secondary to E. coli bacteremia and acute UTI in an immunocompromised patient: Blood culture showed E. coli (4 out of 4 bottles).  Urine culture showed pansensitive E. coli. Patient will be discharged on oral Levaquin  for 4 days to complete 7 days of treatment.  Discussed potential adverse effects of treatment with Levaquin  for the patient. Sinus tachycardia leukocytosis have improved     Colonic/cecal mass, multiple hepatic lesions suspicious for metastasis: S/p colonoscopy on 03/08/2024.  Cecal biopsy obtained for pathological analysis. Dr. Babara, oncologist, recommended liver biopsy of liver lesions.  Interventional radiologist was consulted for this.  However, patient declined.  She will consider liver biopsy in the outpatient setting and is contemplating doing it at [facility]. She has been evaluated by general surgeon and no surgical intervention warranted at this time.  No appendicitis. Follow-up with oncologist to discuss cecal mass pathology results     Hypertension: Continue metoprolol      Rheumatoid arthritis: Resume methotrexate at discharge     Comorbidities include prediabetes, hyperlipidemia, chronic anemia     Her condition has improved and she is deemed stable for  discharge to home today.  Discharge plans discussed with patient, her son, mother, her husband and family friend at the bedside.  I had requested that family member/visitors at the bedside leave the room for one-to-one discussion with the patient.   However patient gave permission for family members/visitors to stay at the bedside during this discussion.         Consultants: Gastroenterologist, oncologist, general surgeon, interventional radiologist Procedures performed: Colonoscopy Disposition: Home Diet recommendation:  Discharge Diet Orders (From admission, onward)     Start     Ordered   04/08/24 0000  Diet - low sodium heart healthy        04/08/24 1518           Cardiac diet DISCHARGE MEDICATION: Allergies as of 04/08/2024   No Known Allergies      Medication List     STOP taking these medications    celecoxib  200 MG capsule Commonly known as: CELEBREX        TAKE these medications    amLODipine  5 MG tablet Commonly known as: NORVASC  Take 1 tablet (5 mg total) by mouth daily.   folic acid 1 MG tablet Commonly known as: FOLVITE Take 1 mg by mouth daily.   Junel  FE 1/20 1-20 MG-MCG tablet Generic drug: norethindrone -ethinyl estradiol -FE TAKE 1 TABLET BY MOUTH EVERY DAY   levofloxacin  750 MG tablet Commonly known as: Levaquin  Take 1 tablet (750 mg total) by mouth daily for 4 days. Start taking on: April 09, 2024   methotrexate 2.5 MG tablet Commonly known as: RHEUMATREX Take 7.5 mg by mouth once a week.   rosuvastatin  20 MG tablet Commonly known as: CRESTOR  TAKE 1 TABLET BY MOUTH EVERY DAY   valACYclovir  500 MG tablet Commonly known as: VALTREX  Take 1 tablet (500 mg total) by mouth daily.   Vitamin D3 1.25 MG (50000 UT) Caps TAKE 1 CAPSULE BY MOUTH ONE TIME PER WEEK        Follow-up Information     Babara Call, MD. Schedule an appointment as soon as possible for a visit in 1 week(s).   Specialty: Oncology Contact information: [address] [place] [address] [phone]                Discharge Exam: Fredricka Weights   04/06/24 1102  Weight: 108 kg   GEN: NAD SKIN: Warm and dry EYES: No pallor or icterus ENT: MMM CV: RRR PULM: CTA B ABD: soft,  obese, NT, +BS CNS: AAO x 3, non focal EXT: No edema or tenderness GU: No CVA tenderness  Condition at discharge: good   The results of significant diagnostics from this hospitalization (including imaging, microbiology, ancillary and laboratory) are listed below for reference.   Imaging Studies: ECHOCARDIOGRAM COMPLETE Result Date: 04/07/2024    ECHOCARDIOGRAM REPORT   Patient Name:   LAKRISTA SCADUTO Date of Exam: 04/06/2024 Medical Rec #:  [id number]      Height:       69.0 in Accession #:    [id number]     Weight:       238.0 lb Date of Birth:  July 19, 1980       BSA:          2.225 m Patient Age:    43 years       BP:           149/78 mmHg Patient Gender: F              HR:  127 bpm. Exam Location:  [facility] Procedure: 2D Echo, Cardiac Doppler and Color Doppler (Both Spectral and Color            Flow Doppler were utilized during procedure). Indications:     R00.8 Other abnormalities of the heart  History:         Patient has no prior history of Echocardiogram examinations.                  Risk Factors:Hypertension.  Sonographer:     Telitha Plath RDCS Referring Phys:  [id number] BRADLY POUR STEPHENS Diagnosing Phys: Evalene Lunger MD IMPRESSIONS  1. Left ventricular ejection fraction, by estimation, is 60 to 65%. The left ventricle has normal function. The left ventricle has no regional wall motion abnormalities. Left ventricular diastolic parameters are indeterminate.  2. Right ventricular systolic function is normal. The right ventricular size is normal. Tricuspid regurgitation signal is inadequate for assessing PA pressure.  3. The mitral valve is normal in structure. No evidence of mitral valve regurgitation. No evidence of mitral stenosis.  4. The aortic valve is normal in structure. Aortic valve regurgitation is not visualized. No aortic stenosis is present.  5. The inferior vena cava is normal in size with greater than 50% respiratory variability, suggesting right atrial pressure of 3 mmHg.  FINDINGS  Left Ventricle: Left ventricular ejection fraction, by estimation, is 60 to 65%. The left ventricle has normal function. The left ventricle has no regional wall motion abnormalities. Strain was performed and the global longitudinal strain is indeterminate. The left ventricular internal cavity size was normal in size. There is no left ventricular hypertrophy. Left ventricular diastolic parameters are indeterminate. Right Ventricle: The right ventricular size is normal. No increase in right ventricular wall thickness. Right ventricular systolic function is normal. Tricuspid regurgitation signal is inadequate for assessing PA pressure. Left Atrium: Left atrial size was normal in size. Right Atrium: Right atrial size was normal in size. Pericardium: There is no evidence of pericardial effusion. Mitral Valve: The mitral valve is normal in structure. No evidence of mitral valve regurgitation. No evidence of mitral valve stenosis. Tricuspid Valve: The tricuspid valve is normal in structure. Tricuspid valve regurgitation is not demonstrated. No evidence of tricuspid stenosis. Aortic Valve: The aortic valve is normal in structure. Aortic valve regurgitation is not visualized. No aortic stenosis is present. Pulmonic Valve: The pulmonic valve was normal in structure. Pulmonic valve regurgitation is not visualized. No evidence of pulmonic stenosis. Aorta: The aortic root is normal in size and structure. Venous: The inferior vena cava is normal in size with greater than 50% respiratory variability, suggesting right atrial pressure of 3 mmHg. IAS/Shunts: No atrial level shunt detected by color flow Doppler. Additional Comments: 3D was performed not requiring image post processing on an independent workstation and was indeterminate.  LEFT VENTRICLE PLAX 2D LVIDd:         4.20 cm LVIDs:         2.30 cm LV PW:         1.10 cm LV IVS:        1.00 cm LVOT diam:     2.00 cm LV SV:         44 LV SV Index:   20 LVOT Area:      3.14 cm  RIGHT VENTRICLE RV Basal diam:  3.70 cm RV S prime:     19.70 cm/s TAPSE (M-mode): 2.1 cm LEFT ATRIUM  Index       RIGHT ATRIUM           Index LA diam:        2.90 cm 1.30 cm/m  RA Area:     10.50 cm LA Vol (A2C):   15.2 ml 6.83 ml/m  RA Volume:   25.40 ml  11.42 ml/m LA Vol (A4C):   15.1 ml 6.79 ml/m LA Biplane Vol: 15.4 ml 6.92 ml/m  AORTIC VALVE LVOT Vmax:   85.65 cm/s LVOT Vmean:  65.750 cm/s LVOT VTI:    0.142 m  AORTA Ao Root diam: 3.00 cm Ao Asc diam:  2.80 cm MV E velocity: 92.70 cm/s MV A velocity: 80.40 cm/s  SHUNTS MV E/A ratio:  1.15        Systemic VTI:  0.14 m                            Systemic Diam: 2.00 cm Evalene Lunger MD Electronically signed by Evalene Lunger MD Signature Date/Time: 04/07/2024/2:56:27 PM    Final    MR LIVER W WO CONTRAST Result Date: 04/07/2024 EXAM: MRCP WITHOUT AND WITH IV CONTRAST 04/07/2024 09:30:07 AM TECHNIQUE: Multisequence, multiplanar magnetic resonance images of the abdomen without and with intravenous contrast. MRCP sequences were performed. 10 mL gadobutrol  (GADAVIST ) 1 MMOL/ML injection 10 mL GADOBUTROL  1 MMOL/ML IV SOLN was administered. COMPARISON: CT of yesterday. CLINICAL HISTORY: Liver lesion, > 1cm. Followup from CT. Eval liver mets? 10 gad given. No hx ca per patient. FINDINGS: LIVER: No significant steatosis. Mild hepatomegaly at 18.5 cm craniocaudal. Multiple bilateral liver lesions are identified. The most suspicious lesion is in the posterior aspect of segment 2 at 2.0 cm on 23 / 17. demonstrates early and delayed hypoenhancement as well as restricted diffusion on image 14 / 12. A segment 4b 6 mm hypoenhancing lesion on 38 / 17 is also suspicious for metastasis. Anterior right hepatic lobe 1.3 cm lesion on image 16 / 15 is moderately T2 hyperintense and demonstrates possible peripheral nodular enhancement on image 35 / 19. less distinct on other postcontrast images. Finally, within the Right Hepatic Lobe, there are  observations of 1.0 and 1.4 cm on early postcontrast images 38 / 17 and 44 / 17 which are relatively occult on other pulse sequences favoring hemangiomas. GALLBLADDER AND BILIARY SYSTEM: Gallbladder is unremarkable. No intrahepatic or extrahepatic ductal dilation. SPLEEN: Unremarkable. PANCREAS/PANCREATIC DUCT: Visualized pancreas is unremarkable. No pancreatic ductal dilatation. ADRENAL GLANDS: Unremarkable. KIDNEYS: Unremarkable. LYMPH NODES: Mild ileocolic mesenteric adenopathy at 9 mm on image 44 / 23. VASCULATURE: Unremarkable. PERITONEUM: No ascites. ABDOMINAL WALL: No hernia. No mass. BOWEL: The cecal mass on CT is again identified including on image 44 / 23. BONES: No acute abnormality or worrisome osseous lesion. SOFT TISSUES: MISCELLANEOUS: Unremarkable. IMPRESSION: 1. Multiple bilateral liver lesions. 2 left hepatic lobe lesions are highly suspicious for metastasis. 1 right liver lobe lesion is indeterminate but favored to represent a hemangioma. 2 smaller right liver lobe lesions are favored to be benign, likely hemangiomas. Consider sampling of the segment 2 lesion versus further evaluation with PET. 2.  Cecal mass with adjacent adenopathy, suspicious for nodal metastasis. Electronically signed by: Rockey Kilts MD 04/07/2024 10:10 AM EDT RP Workstation: [id number]   CT Angio Chest Pulmonary Embolism (PE) W or WO Contrast Result Date: 04/06/2024 CLINICAL DATA:  Left-sided chest pain. EXAM: CT ANGIOGRAPHY CHEST WITH CONTRAST TECHNIQUE: Multidetector CT imaging of the chest was performed using  the standard protocol during bolus administration of intravenous contrast. Multiplanar CT image reconstructions and MIPs were obtained to evaluate the vascular anatomy. RADIATION DOSE REDUCTION: This exam was performed according to the departmental dose-optimization program which includes automated exposure control, adjustment of the mA and/or kV according to patient size and/or use of iterative reconstruction  technique. CONTRAST:  75mL OMNIPAQUE  IOHEXOL  350 MG/ML SOLN COMPARISON:  None Available. FINDINGS: Cardiovascular: Satisfactory opacification of the pulmonary arteries to the segmental level. No evidence of pulmonary embolism. Normal heart size. No pericardial effusion. Mediastinum/Nodes: No enlarged mediastinal, hilar, or axillary lymph nodes. Thyroid  gland, trachea, and esophagus demonstrate no significant findings. Lungs/Pleura: Lungs are clear. No pleural effusion or pneumothorax. Upper Abdomen: No acute abnormality. Musculoskeletal: No chest wall abnormality. No acute or significant osseous findings. Review of the MIP images confirms the above findings. IMPRESSION: No evidence of pulmonary embolism or other acute intrathoracic process. Electronically Signed   By: Greig Pique M.D.   On: 04/06/2024 19:11   CT ABDOMEN PELVIS W CONTRAST Result Date: 04/06/2024 CLINICAL DATA:  Acute aortic syndrome (AAS) suspected, left-sided, sharp, nonradiating chest pain. EXAM: CT ABDOMEN AND PELVIS WITH CONTRAST TECHNIQUE: Multidetector CT imaging of the abdomen and pelvis was performed using the standard protocol following bolus administration of intravenous contrast. RADIATION DOSE REDUCTION: This exam was performed according to the departmental dose-optimization program which includes automated exposure control, adjustment of the mA and/or kV according to patient size and/or use of iterative reconstruction technique. CONTRAST:  100mL OMNIPAQUE  IOHEXOL  300 MG/ML  SOLN COMPARISON:  None available. FINDINGS: Lower chest: No focal airspace consolidation or pleural effusion.3 mm nodule, partially visualized, in the subpleural right lower lobe (axial 1). Hepatobiliary: 1.7 cm lesion in the lateral right hepatic lobe (axial 23). Posterior left hepatic lobe lesion measuring 1.8 cm (axial 14).6 mm hypodensity in the central aspect of segment 4 (axial 22).No radiopaque stones or wall thickening of the gallbladder. No intrahepatic  or extrahepatic biliary ductal dilation. The portal veins are patent. Pancreas: No mass or main ductal dilation. No peripancreatic inflammation or fluid collection. Spleen: Normal size. No mass. Adrenals/Urinary Tract: No adrenal masses. No renal mass. Mild right-sided hydroureteronephrosis. No nephrolithiasis in either kidney. The urinary bladder is distended without focal abnormality. Stomach/Bowel: The stomach is decompressed without focal abnormality. No small bowel wall thickening or inflammation. No small bowel obstruction.Asymmetric wall thickening enhancement in the cecum extending into the appendiceal orifice (axial 50, sagittal 61), worrisome for underlying colonic neoplasm.The appendix is fluid-filled and dilated measuring up to 1.9 cm with periappendiceal inflammation. Vascular/Lymphatic: No aortic aneurysm. A couple of prominent, right ileocolic chain lymph nodes are present measuring up to 7 mm (axial 50-52). Reproductive: The uterus and ovaries are within normal limits for patient's age.No free pelvic fluid. Other: No pneumoperitoneum, ascites, or mesenteric inflammation. Musculoskeletal: No acute fracture or destructive lesion. Multilevel thoracic osteophytosis. IMPRESSION: 1. Asymmetric wall thickening and enhancement in the cecum, measuring 4.3 cm in craniocaudal dimension, and involving the appendiceal orifice (axial 50, sagittal 61), worrisome for underlying colonic neoplasm. Likely causing obstruction of the appendix, which is fluid-filled and dilated appendix, suggesting acute appendicitis. Surgical consultation recommended. 2. A couple of mildly prominent right ileocolic chain lymph nodes are present measuring up to 7 mm (axial 50-52). These could be reactive or metastatic. 3. There are 3 hypodense lesions in both lobes of the liver, measuring up to 1.8 cm, likely metastatic disease. Critical Value/emergent results were called by telephone at the time of interpretation on 04/06/2024 at  3:00 pm  to provider LAMAR PRICE , who verbally acknowledged these results. Electronically Signed   By: Rogelia Myers M.D.   On: 04/06/2024 15:11   DG Chest Port 1 View Result Date: 04/06/2024 EXAM: 1 VIEW(S) XRAY OF THE CHEST 04/06/2024 11:36:23 AM COMPARISON: None available. CLINICAL HISTORY: chest pain. Chest pain FINDINGS: LUNGS AND PLEURA: No focal pulmonary opacity. No pulmonary edema. No pleural effusion. No pneumothorax. HEART AND MEDIASTINUM: No acute abnormality of the cardiac and mediastinal silhouettes. BONES AND SOFT TISSUES: No acute osseous abnormality. IMPRESSION: 1. Normal chest radiograph. Electronically signed by: Lonni Necessary MD 04/06/2024 12:35 PM EDT RP Workstation: [id number]    Microbiology: Results for orders placed or performed during the hospital encounter of 04/06/24  Urine Culture     Status: Abnormal   Collection Time: 04/06/24 11:24 AM   Specimen: Urine, Clean Catch  Result Value Ref Range Status   Specimen Description   Final    URINE, CLEAN CATCH Performed at [facility], [address], [place], [place] [address]    Special Requests   Final    NONE Performed at [facility], [address] [place], [place] [address]    Culture >=100,000 COLONIES/mL ESCHERICHIA COLI (A)  Final   Report Status 04/08/2024 FINAL  Final   Organism ID, Bacteria ESCHERICHIA COLI (A)  Final      Susceptibility   Escherichia coli - MIC*    AMPICILLIN 4 SENSITIVE Sensitive     CEFAZOLIN (URINE) Value in next row Sensitive      2 SENSITIVEThis is a modified FDA-approved test that has been validated and its performance characteristics determined by the reporting laboratory.  This laboratory is certified under the Clinical Laboratory Improvement Amendments CLIA as qualified to perform high complexity clinical laboratory testing.    CEFEPIME  Value in next row Sensitive      2 SENSITIVEThis is a modified FDA-approved test that has been validated and its  performance characteristics determined by the reporting laboratory.  This laboratory is certified under the Clinical Laboratory Improvement Amendments CLIA as qualified to perform high complexity clinical laboratory testing.    ERTAPENEM Value in next row Sensitive      2 SENSITIVEThis is a modified FDA-approved test that has been validated and its performance characteristics determined by the reporting laboratory.  This laboratory is certified under the Clinical Laboratory Improvement Amendments CLIA as qualified to perform high complexity clinical laboratory testing.    CEFTRIAXONE  Value in next row Sensitive      2 SENSITIVEThis is a modified FDA-approved test that has been validated and its performance characteristics determined by the reporting laboratory.  This laboratory is certified under the Clinical Laboratory Improvement Amendments CLIA as qualified to perform high complexity clinical laboratory testing.    CIPROFLOXACIN Value in next row Sensitive      2 SENSITIVEThis is a modified FDA-approved test that has been validated and its performance characteristics determined by the reporting laboratory.  This laboratory is certified under the Clinical Laboratory Improvement Amendments CLIA as qualified to perform high complexity clinical laboratory testing.    GENTAMICIN Value in next row Sensitive      2 SENSITIVEThis is a modified FDA-approved test that has been validated and its performance characteristics determined by the reporting laboratory.  This laboratory is certified under the Clinical Laboratory Improvement Amendments CLIA as qualified to perform high complexity clinical laboratory testing.    NITROFURANTOIN Value in next row Sensitive      2 SENSITIVEThis  is a modified FDA-approved test that has been validated and its performance characteristics determined by the reporting laboratory.  This laboratory is certified under the Clinical Laboratory Improvement Amendments CLIA as qualified to  perform high complexity clinical laboratory testing.    TRIMETH/SULFA Value in next row Sensitive      2 SENSITIVEThis is a modified FDA-approved test that has been validated and its performance characteristics determined by the reporting laboratory.  This laboratory is certified under the Clinical Laboratory Improvement Amendments CLIA as qualified to perform high complexity clinical laboratory testing.    AMPICILLIN/SULBACTAM Value in next row Sensitive      2 SENSITIVEThis is a modified FDA-approved test that has been validated and its performance characteristics determined by the reporting laboratory.  This laboratory is certified under the Clinical Laboratory Improvement Amendments CLIA as qualified to perform high complexity clinical laboratory testing.    PIP/TAZO Value in next row Sensitive      <=4 SENSITIVEThis is a modified FDA-approved test that has been validated and its performance characteristics determined by the reporting laboratory.  This laboratory is certified under the Clinical Laboratory Improvement Amendments CLIA as qualified to perform high complexity clinical laboratory testing.    MEROPENEM Value in next row Sensitive      <=4 SENSITIVEThis is a modified FDA-approved test that has been validated and its performance characteristics determined by the reporting laboratory.  This laboratory is certified under the Clinical Laboratory Improvement Amendments CLIA as qualified to perform high complexity clinical laboratory testing.    * >=100,000 COLONIES/mL ESCHERICHIA COLI  Blood culture (routine x 2)     Status: None (Preliminary result)   Collection Time: 04/06/24  2:24 PM   Specimen: BLOOD  Result Value Ref Range Status   Specimen Description   Final    BLOOD RIGHT ANTECUBITAL Performed at [facility], [address] [place], [place] [address]    Special Requests   Final    BOTTLES DRAWN AEROBIC AND ANAEROBIC Blood Culture results may not be optimal due to an  inadequate volume of blood received in culture bottles Performed at [facility], [address], [place], [place] [address]    Culture  Setup Time   Final    GRAM NEGATIVE RODS IN BOTH AEROBIC AND ANAEROBIC BOTTLES CRITICAL VALUE NOTED.  VALUE IS CONSISTENT WITH PREVIOUSLY REPORTED AND CALLED VALUE. Performed at [facility], [address] [place], [place] [address]    Culture GRAM NEGATIVE RODS  Final   Report Status PENDING  Incomplete  Blood culture (routine x 2)     Status: Abnormal (Preliminary result)   Collection Time: 04/06/24  2:24 PM   Specimen: BLOOD  Result Value Ref Range Status   Specimen Description   Final    BLOOD BLOOD LEFT HAND Performed at [facility], [address], [place], [place] [address]    Special Requests   Final    BOTTLES DRAWN AEROBIC AND ANAEROBIC Blood Culture results may not be optimal due to an inadequate volume of blood received in culture bottles Performed at [facility], [address] [place], [place] [address]    Culture  Setup Time   Final    GRAM NEGATIVE RODS IN BOTH AEROBIC AND ANAEROBIC BOTTLES CRITICAL RESULT CALLED TO, READ BACK BY AND VERIFIED WITH:  JASON ROBBINS AT 9847 04/07/24 JG    Culture (A)  Final    ESCHERICHIA COLI SUSCEPTIBILITIES TO FOLLOW Performed at [facility], [address] [place], [place] [address]    Report Status PENDING  Incomplete  Blood Culture ID Panel (Reflexed)     Status: Abnormal   Collection Time: 04/06/24  2:24 PM  Result Value Ref Range Status   Enterococcus faecalis NOT DETECTED NOT DETECTED Final   Enterococcus Faecium NOT DETECTED NOT DETECTED Final   Listeria monocytogenes NOT DETECTED NOT DETECTED Final   Staphylococcus species NOT DETECTED NOT DETECTED Final   Staphylococcus aureus (BCID) NOT DETECTED NOT DETECTED Final   Staphylococcus epidermidis NOT DETECTED NOT DETECTED Final   Staphylococcus lugdunensis NOT DETECTED NOT DETECTED  Final   Streptococcus species NOT DETECTED NOT DETECTED Final   Streptococcus agalactiae NOT DETECTED NOT DETECTED Final   Streptococcus pneumoniae NOT DETECTED NOT DETECTED Final   Streptococcus pyogenes NOT DETECTED NOT DETECTED Final   A.calcoaceticus-baumannii NOT DETECTED NOT DETECTED Final   Bacteroides fragilis NOT DETECTED NOT DETECTED Final   Enterobacterales DETECTED (A) NOT DETECTED Final    Comment: Enterobacterales represent a large order of gram negative bacteria, not a single organism. CRITICAL RESULT CALLED TO, READ BACK BY AND VERIFIED WITH:  JASON ROBBINS AT 0152 04/07/24 JG    Enterobacter cloacae complex NOT DETECTED NOT DETECTED Final   Escherichia coli DETECTED (A) NOT DETECTED Final    Comment: CRITICAL RESULT CALLED TO, READ BACK BY AND VERIFIED WITH:  JASON ROBBINS AT 0152 04/07/24 JG    Klebsiella aerogenes NOT DETECTED NOT DETECTED Final   Klebsiella oxytoca NOT DETECTED NOT DETECTED Final   Klebsiella pneumoniae NOT DETECTED NOT DETECTED Final   Proteus species NOT DETECTED NOT DETECTED Final   Salmonella species NOT DETECTED NOT DETECTED Final   Serratia marcescens NOT DETECTED NOT DETECTED Final   Haemophilus influenzae NOT DETECTED NOT DETECTED Final   Neisseria meningitidis NOT DETECTED NOT DETECTED Final   Pseudomonas aeruginosa NOT DETECTED NOT DETECTED Final   Stenotrophomonas maltophilia NOT DETECTED NOT DETECTED Final   Candida albicans NOT DETECTED NOT DETECTED Final   Candida auris NOT DETECTED NOT DETECTED Final   Candida glabrata NOT DETECTED NOT DETECTED Final   Candida krusei NOT DETECTED NOT DETECTED Final   Candida parapsilosis NOT DETECTED NOT DETECTED Final   Candida tropicalis NOT DETECTED NOT DETECTED Final   Cryptococcus neoformans/gattii NOT DETECTED NOT DETECTED Final   CTX-M ESBL NOT DETECTED NOT DETECTED Final   Carbapenem resistance IMP NOT DETECTED NOT DETECTED Final   Carbapenem resistance KPC NOT DETECTED NOT DETECTED Final    Carbapenem resistance NDM NOT DETECTED NOT DETECTED Final   Carbapenem resist OXA 48 LIKE NOT DETECTED NOT DETECTED Final   Carbapenem resistance VIM NOT DETECTED NOT DETECTED Final    Comment: Performed at [facility], [address] [place], [place] [address]    Labs: CBC: Recent Labs  Lab 04/06/24 1124 04/06/24 1541 04/07/24 0624  WBC 14.4* 14.9* 10.3  NEUTROABS  --  13.4*  --   HGB 11.3* 11.8* 10.3*  HCT 35.3* 35.7* 31.5*  MCV 78.4* 77.4* 77.6*  PLT 316 316 307   Basic Metabolic Panel: Recent Labs  Lab 04/06/24 1124 04/06/24 1541 04/07/24 0624  NA 136 138 140  K 3.5 3.8 3.6  CL 103 103 109  CO2 22 23 23   GLUCOSE 103* 105* 82  BUN 9 7 9   CREATININE 1.11* 1.03* 0.92  CALCIUM  8.8* 8.6* 8.1*   Liver Function Tests: Recent Labs  Lab 04/06/24 1124 04/06/24 1541  AST 16 18  ALT 13 12  ALKPHOS  56 54  BILITOT 0.9 1.1  PROT 7.3 7.3  ALBUMIN 3.3* 3.1*   CBG: No results for input(s): GLUCAP in the last 168 hours.  Discharge time spent: greater than 30 minutes.  Signed: AIDA CHO, MD [facility] 04/08/2024

## 2024-04-08 NOTE — Plan of Care (Signed)
 Brief Interventional Radiology Note:  IR with request for liver lesion biopsy. After interdisciplinary discussion between IR, Oncology, and Medicine team it was decided that IR would attempt biopsy in CT with additional US  guidance as needed.   Patient seen at the bedside with family. After a long discussion with patient and family, they all agreed they would prefer to hold on the biopsy procedure at this time. Patient verbalized interest in having further discussion with the Oncology team and Dr. Babara prior to moving forward with a biopsy. She also expressed interest in pursuing further care at [facility] in [place]. Informed patient and family that I would share this decision with the team and if they had any other questions to reach out to the Radiology team. Patient and family verbalized understanding.   IR will delete order at this time.   Please feel free to reach out or place a new consult if patient's decision changes and she wishes to pursue biopsy at this time.  Electronically Signed: Farhad Burleson M Hadrian Yarbrough, PA-C 04/08/2024, 1:31 PM

## 2024-04-08 NOTE — Progress Notes (Signed)
 The patient underwent a colonoscopy today with a cecal mass found.  There were multiple biopsies sent off stat.  The patient tolerated the procedure well nothing further to do from a GI point of view.  I will sign off.  Please call if any further GI concerns or questions.  We would like to thank you for the opportunity to participate in the care of Amber Price.

## 2024-04-08 NOTE — Anesthesia Procedure Notes (Signed)
 Procedure Name: MAC Date/Time: 04/08/2024 9:32 AM  Performed by: Lorrene Camelia LABOR, CRNAPre-anesthesia Checklist: Patient identified, Emergency Drugs available, Suction available and Patient being monitored Patient Re-evaluated:Patient Re-evaluated prior to induction Oxygen Delivery Method: Simple face mask Preoxygenation: Pre-oxygenation with 100% oxygen Induction Type: IV induction Comments: POM

## 2024-04-08 NOTE — Plan of Care (Signed)

## 2024-04-08 NOTE — Anesthesia Preprocedure Evaluation (Signed)
 Anesthesia Evaluation  Patient identified by MRN, date of birth, ID band Patient awake    Reviewed: Allergy & Precautions, NPO status , Patient's Chart, lab work & pertinent test results  Airway Mallampati: II  TM Distance: >3 FB Neck ROM: full    Dental  (+) Teeth Intact   Pulmonary neg pulmonary ROS   Pulmonary exam normal        Cardiovascular Exercise Tolerance: Good hypertension, Pt. on medications negative cardio ROS Normal cardiovascular exam Rhythm:Regular Rate:Normal     Neuro/Psych negative neurological ROS  negative psych ROS   GI/Hepatic negative GI ROS, Neg liver ROS,,,  Endo/Other  negative endocrine ROS  Class 3 obesity  Renal/GU negative Renal ROS  negative genitourinary   Musculoskeletal   Abdominal  (+) + obese  Peds negative pediatric ROS (+)  Hematology negative hematology ROS (+) Blood dyscrasia, anemia   Anesthesia Other Findings Past Medical History: No date: Anemia     Comment:  In pregnancy No date: Bacterial vaginosis 2002: Cervical dysplasia     Comment:  [place] No date: Herpes 01/09/2015: History of Papanicolaou smear of cervix     Comment:  -/- 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 RECK 2018 ANNUAL No date: Sickle cell trait 01/2016: Vitamin D  deficiency     Comment:  ADD VIT D3 5000 IU DAILY  Past Surgical History: No date: NO PAST SURGERIES  BMI    Body Mass Index: 35.15 kg/m      Reproductive/Obstetrics negative OB ROS                              Anesthesia Physical Anesthesia Plan  ASA: 2  Anesthesia Plan: General   Post-op Pain Management:    Induction: Intravenous  PONV Risk Score and Plan: Propofol  infusion and TIVA  Airway Management Planned: Natural Airway and Nasal Cannula  Additional Equipment:   Intra-op Plan:    Post-operative Plan:   Informed Consent: I have reviewed the patients History and Physical, chart, labs and discussed the procedure including the risks, benefits and alternatives for the proposed anesthesia with the patient or authorized representative who has indicated his/her understanding and acceptance.     Dental Advisory Given  Plan Discussed with: CRNA  Anesthesia Plan Comments:         Anesthesia Quick Evaluation

## 2024-04-08 NOTE — Transfer of Care (Signed)
 Immediate Anesthesia Transfer of Care Note  Patient: Amber Price  Procedure(s) Performed: COLONOSCOPY  Patient Location: Endoscopy Unit  Anesthesia Type:General  Level of Consciousness: drowsy  Airway & Oxygen Therapy: Patient Spontanous Breathing  Post-op Assessment: Report given to RN and Post -op Vital signs reviewed and stable  Post vital signs: Reviewed and stable  Last Vitals:  Vitals Value Taken Time  BP 124/58 04/08/24 09:49  Temp    Pulse    Resp    SpO2      Last Pain:  Vitals:   04/08/24 0907  TempSrc: Temporal  PainSc:          Complications: No notable events documented.

## 2024-04-08 NOTE — Progress Notes (Signed)
 Hematology/Oncology Progress note [phone] [phone]     Patient Care Team: Fernand Fredy RAMAN, MD as PCP - General (Internal Medicine)   Name of the patient: Amber Price  [id number]  09/29/1980  Date of visit: 04/08/24   INTERVAL HISTORY-   Status post colonoscopy today.  A fungating partially obstructing cecum mass was found.  Biopsy was taken.  04/07/2024 MRI liver protocol showed multiple bilateral liver lesions.  Left hepatic lobe lesions are highly suspicious for metastasis.  Right liver lobe lesion is indeterminate but favored to be a hemangioma.  2 smaller right liver lobe lesions are favored to be benign.  Cecal mass with adjacent adenopathy.  Patient has discussed with interventional radiology and has decided not to proceed with liver biopsy during current admission.     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) 11/20/2022   Mixed hyperlipidemia 11/20/2022   Essential hypertension, benign 08/19/2022   Anemia complicating pregnancy in third trimester 01/05/2019   Rubella non-immune status, antepartum 01/05/2019   Labor and delivery, indication for care 11/28/2018   Advanced maternal age in multigravida, first trimester    Fibroid 12/15/2017   History of herpes genitalis 07/03/2017   Obesity (BMI 30-39.9) 07/03/2017     Past Medical History:  Diagnosis Date   Anemia    In pregnancy   Bacterial vaginosis    Cervical dysplasia 2002   [place]   Herpes    History of Papanicolaou smear of cervix  01/09/2015   -/-   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 RECK 2018 ANNUAL   Sickle cell trait    Vitamin D  deficiency 01/2016   ADD VIT D3 5000 IU DAILY     Past Surgical History:  Procedure Laterality Date   COLONOSCOPY N/A 04/08/2024   Procedure: COLONOSCOPY;  Surgeon: Jinny Carmine, MD;  Location: [facility] ENDOSCOPY;  Service: Endoscopy;  Laterality: N/A;   NO PAST SURGERIES      Social History   Socioeconomic History   Marital status: Single    Spouse name: Not on file   Number of children: 3   Years of education: 14   Highest education level: Not on file  Occupational History   Occupation: BUSINESS OFFICE SPECIALIST  Tobacco Use   Smoking status: Never   Smokeless tobacco: Never  Vaping Use   Vaping status: Never Used  Substance and Sexual Activity   Alcohol use: Not Currently   Drug use: No   Sexual activity: Yes    Birth control/protection: Pill  Other Topics Concern   Not on file  Social History Narrative   Not on file   Social Drivers of Health   Financial Resource Strain: Medium Risk (04/06/2024)   Received from [facility]   Overall Financial Resource Strain (CARDIA)    Difficulty of Paying Living Expenses: Somewhat hard  Food Insecurity: Food Insecurity Present (04/06/2024)   Received from [facility]  Hunger Vital Sign    Within the past 12 months, you worried that your food would run out before you got the money to buy more.: Sometimes true    Within the past 12 months, the food you bought just didn't last and you didn't have money to get more.: Never true  Transportation Needs: No Transportation Needs (04/06/2024)   Received from [facility] - Transportation    In the past 12 months, has lack of transportation kept you from medical appointments or from getting medications?: No    Lack of Transportation (Non-Medical): No   Physical Activity: Not on file  Stress: Not on file  Social Connections: Not on file  Intimate Partner Violence: Not At Risk (04/06/2024)   Humiliation, Afraid, Rape, and Kick questionnaire    Fear of Current or Ex-Partner: No    Emotionally Abused: No    Physically Abused: No    Sexually Abused: No     Family History  Problem Relation Age of Onset   Diabetes Mellitus II Mother    Hypertension Mother    Hypercholesterolemia Father    Cancer Paternal Grandmother 83       LUNG   Sickle cell trait Son      Current Facility-Administered Medications:    acetaminophen  (TYLENOL ) tablet 650 mg, 650 mg, Oral, Q6H PRN **OR** acetaminophen  (TYLENOL ) suppository 650 mg, 650 mg, Rectal, Q6H PRN, Jinny, Darren, MD   cefTRIAXone  (ROCEPHIN ) 2 g in sodium chloride  0.9 % 100 mL IVPB, 2 g, Intravenous, Q24H, Jinny Carmine, MD, Stopped at 04/08/24 0603   enoxaparin  (LOVENOX ) injection 55 mg, 55 mg, Subcutaneous, Q24H, Jinny, Darren, MD, 55 mg at 04/07/24 2149   HYDROcodone -acetaminophen  (NORCO/VICODIN) 5-325 MG per tablet 1-2 tablet, 1-2 tablet, Oral, Q4H PRN, Jinny Carmine, MD   labetalol  (NORMODYNE ) injection 10 mg, 10 mg, Intravenous, Once, Wohl, Darren, MD   metoprolol  tartrate (LOPRESSOR ) tablet 12.5 mg, 12.5 mg, Oral, BID, Jinny, Darren, MD, 12.5 mg at 04/08/24 1048   morphine  (PF) 2 MG/ML injection 2 mg, 2 mg, Intravenous, Q2H PRN, Jinny, Darren, MD   ondansetron  (ZOFRAN ) tablet 4 mg, 4 mg, Oral, Q6H PRN **OR** ondansetron  (ZOFRAN ) injection 4 mg, 4 mg, Intravenous, Q6H PRN, Jinny, Darren, MD   senna-docusate (Senokot-S) tablet 1 tablet, 1 tablet, Oral, QHS PRN, Jinny Carmine, MD  Current Outpatient Medications:    amLODipine  (NORVASC ) 5 MG tablet, Take 1 tablet (5 mg total) by mouth daily., Disp: 30 tablet, Rfl: 11   Cholecalciferol (VITAMIN D3) 1.25 MG (50000 UT) CAPS, TAKE 1 CAPSULE BY MOUTH ONE TIME PER WEEK, Disp: 12 capsule, Rfl: 3   folic acid (FOLVITE) 1 MG tablet, Take 1 mg by mouth daily.,  Disp: , Rfl:    [START ON 04/09/2024] levofloxacin  (LEVAQUIN ) 750 MG tablet, Take 1 tablet (750 mg total) by mouth daily for 4 days., Disp: 4 tablet, Rfl: 0   methotrexate (RHEUMATREX) 2.5 MG tablet, Take 7.5 mg by mouth once a week., Disp: , Rfl:    rosuvastatin  (CRESTOR ) 20 MG tablet, TAKE 1 TABLET BY MOUTH EVERY DAY, Disp: 90 tablet, Rfl: 3   valACYclovir  (VALTREX ) 500 MG tablet, Take 1 tablet (500 mg total) by mouth daily., Disp: 90 tablet, Rfl: 3   JUNEL  FE 1/20 1-20 MG-MCG tablet, TAKE 1 TABLET BY MOUTH EVERY DAY, Disp: 84 tablet, Rfl: 1   Physical exam:  Vitals:   04/08/24 0949 04/08/24 0957 04/08/24 1007 04/08/24 1503  BP: (!) 124/58 138/70 (!) 152/82 ROLLEN)  154/81  Pulse:  89 98 90  Resp:  (!) 23 18 16   Temp:    98.4 F (36.9 C)  TempSrc:    Oral  SpO2:  98% 99% 99%  Weight:      Height:       Physical Exam Constitutional:      General: She is not in acute distress.    Appearance: She is not diaphoretic.  HENT:     Head: Normocephalic and atraumatic.  Eyes:     General: No scleral icterus. Cardiovascular:     Rate and Rhythm: Normal rate.  Pulmonary:     Effort: Pulmonary effort is normal. No respiratory distress.  Abdominal:     General: There is no distension.  Musculoskeletal:        General: Normal range of motion.  Skin:    Findings: No erythema.  Neurological:     Mental Status: She is alert and oriented to person, place, and time. Mental status is at baseline.     Motor: No abnormal muscle tone.  Psychiatric:        Mood and Affect: Mood and affect normal.       Labs    Latest Ref Rng & Units 04/07/2024    6:24 AM 04/06/2024    3:41 PM 04/06/2024   11:24 AM  CBC  WBC 4.0 - 10.5 K/uL 10.3  14.9  14.4   Hemoglobin 12.0 - 15.0 g/dL 89.6  88.1  88.6   Hematocrit 36.0 - 46.0 % 31.5  35.7  35.3   Platelets 150 - 400 K/uL 307  316  316       Latest Ref Rng & Units 04/07/2024    6:24 AM 04/06/2024    3:41 PM 04/06/2024   11:24 AM  CMP  Glucose 70 - 99  mg/dL 82  894  896   BUN 6 - 20 mg/dL 9  7  9    Creatinine 0.44 - 1.00 mg/dL 9.07  8.96  8.88   Sodium 135 - 145 mmol/L 140  138  136   Potassium 3.5 - 5.1 mmol/L 3.6  3.8  3.5   Chloride 98 - 111 mmol/L 109  103  103   CO2 22 - 32 mmol/L 23  23  22    Calcium  8.9 - 10.3 mg/dL 8.1  8.6  8.8   Total Protein 6.5 - 8.1 g/dL  7.3  7.3   Total Bilirubin 0.0 - 1.2 mg/dL  1.1  0.9   Alkaline Phos 38 - 126 U/L  54  56   AST 15 - 41 U/L  18  16   ALT 0 - 44 U/L  12  13      RADIOGRAPHIC STUDIES: I have personally reviewed the radiological images as listed and agreed with the findings in the report. ECHOCARDIOGRAM COMPLETE Result Date: 04/07/2024    ECHOCARDIOGRAM REPORT   Patient Name:   KENLEE VOGT Date of Exam: 04/06/2024 Medical Rec #:  [id number]      Height:       69.0 in Accession #:    [id number]     Weight:       238.0 lb Date of Birth:  Nov 03, 1980       BSA:          2.225 m Patient Age:    43 years       BP:           149/78 mmHg Patient Gender: F  HR:           127 bpm. Exam Location:  [facility] Procedure: 2D Echo, Cardiac Doppler and Color Doppler (Both Spectral and Color            Flow Doppler were utilized during procedure). Indications:     R00.8 Other abnormalities of the heart  History:         Patient has no prior history of Echocardiogram examinations.                  Risk Factors:Hypertension.  Sonographer:     Teal Raben RDCS Referring Phys:  [id number] BRADLY POUR STEPHENS Diagnosing Phys: Evalene Lunger MD IMPRESSIONS  1. Left ventricular ejection fraction, by estimation, is 60 to 65%. The left ventricle has normal function. The left ventricle has no regional wall motion abnormalities. Left ventricular diastolic parameters are indeterminate.  2. Right ventricular systolic function is normal. The right ventricular size is normal. Tricuspid regurgitation signal is inadequate for assessing PA pressure.  3. The mitral valve is normal in structure. No evidence of mitral valve  regurgitation. No evidence of mitral stenosis.  4. The aortic valve is normal in structure. Aortic valve regurgitation is not visualized. No aortic stenosis is present.  5. The inferior vena cava is normal in size with greater than 50% respiratory variability, suggesting right atrial pressure of 3 mmHg. FINDINGS  Left Ventricle: Left ventricular ejection fraction, by estimation, is 60 to 65%. The left ventricle has normal function. The left ventricle has no regional wall motion abnormalities. Strain was performed and the global longitudinal strain is indeterminate. The left ventricular internal cavity size was normal in size. There is no left ventricular hypertrophy. Left ventricular diastolic parameters are indeterminate. Right Ventricle: The right ventricular size is normal. No increase in right ventricular wall thickness. Right ventricular systolic function is normal. Tricuspid regurgitation signal is inadequate for assessing PA pressure. Left Atrium: Left atrial size was normal in size. Right Atrium: Right atrial size was normal in size. Pericardium: There is no evidence of pericardial effusion. Mitral Valve: The mitral valve is normal in structure. No evidence of mitral valve regurgitation. No evidence of mitral valve stenosis. Tricuspid Valve: The tricuspid valve is normal in structure. Tricuspid valve regurgitation is not demonstrated. No evidence of tricuspid stenosis. Aortic Valve: The aortic valve is normal in structure. Aortic valve regurgitation is not visualized. No aortic stenosis is present. Pulmonic Valve: The pulmonic valve was normal in structure. Pulmonic valve regurgitation is not visualized. No evidence of pulmonic stenosis. Aorta: The aortic root is normal in size and structure. Venous: The inferior vena cava is normal in size with greater than 50% respiratory variability, suggesting right atrial pressure of 3 mmHg. IAS/Shunts: No atrial level shunt detected by color flow Doppler. Additional  Comments: 3D was performed not requiring image post processing on an independent workstation and was indeterminate.  LEFT VENTRICLE PLAX 2D LVIDd:         4.20 cm LVIDs:         2.30 cm LV PW:         1.10 cm LV IVS:        1.00 cm LVOT diam:     2.00 cm LV SV:         44 LV SV Index:   20 LVOT Area:     3.14 cm  RIGHT VENTRICLE RV Basal diam:  3.70 cm RV S prime:     19.70 cm/s TAPSE (M-mode): 2.1 cm LEFT  ATRIUM             Index       RIGHT ATRIUM           Index LA diam:        2.90 cm 1.30 cm/m  RA Area:     10.50 cm LA Vol (A2C):   15.2 ml 6.83 ml/m  RA Volume:   25.40 ml  11.42 ml/m LA Vol (A4C):   15.1 ml 6.79 ml/m LA Biplane Vol: 15.4 ml 6.92 ml/m  AORTIC VALVE LVOT Vmax:   85.65 cm/s LVOT Vmean:  65.750 cm/s LVOT VTI:    0.142 m  AORTA Ao Root diam: 3.00 cm Ao Asc diam:  2.80 cm MV E velocity: 92.70 cm/s MV A velocity: 80.40 cm/s  SHUNTS MV E/A ratio:  1.15        Systemic VTI:  0.14 m                            Systemic Diam: 2.00 cm Evalene Lunger MD Electronically signed by Evalene Lunger MD Signature Date/Time: 04/07/2024/2:56:27 PM    Final    MR LIVER W WO CONTRAST Result Date: 04/07/2024 EXAM: MRCP WITHOUT AND WITH IV CONTRAST 04/07/2024 09:30:07 AM TECHNIQUE: Multisequence, multiplanar magnetic resonance images of the abdomen without and with intravenous contrast. MRCP sequences were performed. 10 mL gadobutrol  (GADAVIST ) 1 MMOL/ML injection 10 mL GADOBUTROL  1 MMOL/ML IV SOLN was administered. COMPARISON: CT of yesterday. CLINICAL HISTORY: Liver lesion, > 1cm. Followup from CT. Eval liver mets? 10 gad given. No hx ca per patient. FINDINGS: LIVER: No significant steatosis. Mild hepatomegaly at 18.5 cm craniocaudal. Multiple bilateral liver lesions are identified. The most suspicious lesion is in the posterior aspect of segment 2 at 2.0 cm on 23 / 17. demonstrates early and delayed hypoenhancement as well as restricted diffusion on image 14 / 12. A segment 4b 6 mm hypoenhancing lesion on 38 /  17 is also suspicious for metastasis. Anterior right hepatic lobe 1.3 cm lesion on image 16 / 15 is moderately T2 hyperintense and demonstrates possible peripheral nodular enhancement on image 35 / 19. less distinct on other postcontrast images. Finally, within the Right Hepatic Lobe, there are observations of 1.0 and 1.4 cm on early postcontrast images 38 / 17 and 44 / 17 which are relatively occult on other pulse sequences favoring hemangiomas. GALLBLADDER AND BILIARY SYSTEM: Gallbladder is unremarkable. No intrahepatic or extrahepatic ductal dilation. SPLEEN: Unremarkable. PANCREAS/PANCREATIC DUCT: Visualized pancreas is unremarkable. No pancreatic ductal dilatation. ADRENAL GLANDS: Unremarkable. KIDNEYS: Unremarkable. LYMPH NODES: Mild ileocolic mesenteric adenopathy at 9 mm on image 44 / 23. VASCULATURE: Unremarkable. PERITONEUM: No ascites. ABDOMINAL WALL: No hernia. No mass. BOWEL: The cecal mass on CT is again identified including on image 44 / 23. BONES: No acute abnormality or worrisome osseous lesion. SOFT TISSUES: MISCELLANEOUS: Unremarkable. IMPRESSION: 1. Multiple bilateral liver lesions. 2 left hepatic lobe lesions are highly suspicious for metastasis. 1 right liver lobe lesion is indeterminate but favored to represent a hemangioma. 2 smaller right liver lobe lesions are favored to be benign, likely hemangiomas. Consider sampling of the segment 2 lesion versus further evaluation with PET. 2.  Cecal mass with adjacent adenopathy, suspicious for nodal metastasis. Electronically signed by: Rockey Kilts MD 04/07/2024 10:10 AM EDT RP Workstation: [id number]   CT Angio Chest Pulmonary Embolism (PE) W or WO Contrast Result Date: 04/06/2024 CLINICAL DATA:  Left-sided chest pain. EXAM: CT ANGIOGRAPHY  CHEST WITH CONTRAST TECHNIQUE: Multidetector CT imaging of the chest was performed using the standard protocol during bolus administration of intravenous contrast. Multiplanar CT image reconstructions and MIPs  were obtained to evaluate the vascular anatomy. RADIATION DOSE REDUCTION: This exam was performed according to the departmental dose-optimization program which includes automated exposure control, adjustment of the mA and/or kV according to patient size and/or use of iterative reconstruction technique. CONTRAST:  75mL OMNIPAQUE  IOHEXOL  350 MG/ML SOLN COMPARISON:  None Available. FINDINGS: Cardiovascular: Satisfactory opacification of the pulmonary arteries to the segmental level. No evidence of pulmonary embolism. Normal heart size. No pericardial effusion. Mediastinum/Nodes: No enlarged mediastinal, hilar, or axillary lymph nodes. Thyroid  gland, trachea, and esophagus demonstrate no significant findings. Lungs/Pleura: Lungs are clear. No pleural effusion or pneumothorax. Upper Abdomen: No acute abnormality. Musculoskeletal: No chest wall abnormality. No acute or significant osseous findings. Review of the MIP images confirms the above findings. IMPRESSION: No evidence of pulmonary embolism or other acute intrathoracic process. Electronically Signed   By: Greig Pique M.D.   On: 04/06/2024 19:11   CT ABDOMEN PELVIS W CONTRAST Result Date: 04/06/2024 CLINICAL DATA:  Acute aortic syndrome (AAS) suspected, left-sided, sharp, nonradiating chest pain. EXAM: CT ABDOMEN AND PELVIS WITH CONTRAST TECHNIQUE: Multidetector CT imaging of the abdomen and pelvis was performed using the standard protocol following bolus administration of intravenous contrast. RADIATION DOSE REDUCTION: This exam was performed according to the departmental dose-optimization program which includes automated exposure control, adjustment of the mA and/or kV according to patient size and/or use of iterative reconstruction technique. CONTRAST:  OMNIPAQUE  IOHEXOL  300 MG/ML  SOLN COMPARISON:  None available. FINDINGS: Lower chest: No focal airspace consolidation or pleural effusion.3 mm nodule, partially visualized, in the subpleural right lower  lobe (axial 1). Hepatobiliary: 1.7 cm lesion in the lateral right hepatic lobe (axial 23). Posterior left hepatic lobe lesion measuring 1.8 cm (axial 14).6 mm hypodensity in the central aspect of segment 4 (axial 22).No radiopaque stones or wall thickening of the gallbladder. No intrahepatic or extrahepatic biliary ductal dilation. The portal veins are patent. Pancreas: No mass or main ductal dilation. No peripancreatic inflammation or fluid collection. Spleen: Normal size. No mass. Adrenals/Urinary Tract: No adrenal masses. No renal mass. Mild right-sided hydroureteronephrosis. No nephrolithiasis in either kidney. The urinary bladder is distended without focal abnormality. Stomach/Bowel: The stomach is decompressed without focal abnormality. No small bowel wall thickening or inflammation. No small bowel obstruction.Asymmetric wall thickening enhancement in the cecum extending into the appendiceal orifice (axial 50, sagittal 61), worrisome for underlying colonic neoplasm.The appendix is fluid-filled and dilated measuring up to 1.9 cm with periappendiceal inflammation. Vascular/Lymphatic: No aortic aneurysm. A couple of prominent, right ileocolic chain lymph nodes are present measuring up to 7 mm (axial 50-52). Reproductive: The uterus and ovaries are within normal limits for patient's age.No free pelvic fluid. Other: No pneumoperitoneum, ascites, or mesenteric inflammation. Musculoskeletal: No acute fracture or destructive lesion. Multilevel thoracic osteophytosis. IMPRESSION: 1. Asymmetric wall thickening and enhancement in the cecum, measuring 4.3 cm in craniocaudal dimension, and involving the appendiceal orifice (axial 50, sagittal 61), worrisome for underlying colonic neoplasm. Likely causing obstruction of the appendix, which is fluid-filled and dilated appendix, suggesting acute appendicitis. Surgical consultation recommended. 2. A couple of mildly prominent right ileocolic chain lymph nodes are present  measuring up to 7 mm (axial 50-52). These could be reactive or metastatic. 3. There are 3 hypodense lesions in both lobes of the liver, measuring up to 1.8 cm, likely metastatic disease. Critical  Value/emergent results were called by telephone at the time of interpretation on 04/06/2024 at 3:00 pm to provider LAMAR PRICE , who verbally acknowledged these results. Electronically Signed   By: Rogelia Myers M.D.   On: 04/06/2024 15:11   DG Chest Port 1 View Result Date: 04/06/2024 EXAM: 1 VIEW(S) XRAY OF THE CHEST 04/06/2024 11:36:23 AM COMPARISON: None available. CLINICAL HISTORY: chest pain. Chest pain FINDINGS: LUNGS AND PLEURA: No focal pulmonary opacity. No pulmonary edema. No pleural effusion. No pneumothorax. HEART AND MEDIASTINUM: No acute abnormality of the cardiac and mediastinal silhouettes. BONES AND SOFT TISSUES: No acute osseous abnormality. IMPRESSION: 1. Normal chest radiograph. Electronically signed by: Lonni Necessary MD 04/06/2024 12:35 PM EDT RP Workstation: [id number]    Assessment and plan-   # Cecal mass with adenopathy and liver lesions.  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 Status post biopsy of cecal mass. MRI liver findings were reviewed and discussed with patient. Recommend liver lesion biopsy. Patient does not want to get a liver biopsy done during current admission.  She may want to get the biopsy done at [facility]. She will follow-up with me outpatient to review colonoscopy biopsy results. Tumor marker CEA is pending.  # Iron deficiency anemia.  Outpatient management. # Sepsis due to E. coli bacteremia, UTI. She was treated with IV antibiotics and well be discharged home with oral Levaquin . # Rheumatoid arthritis on methotrexate.  Thank you for allowing me to participate in the care of this patient.   Zelphia Cap, MD, PhD Hematology Oncology 04/08/2024

## 2024-04-08 NOTE — Telephone Encounter (Signed)
 [facility] called and wants to make an appt for pt.  I told them I would send a message to our referral coordinator and once she gets that referral from [facility] then she will call the pt to schedule.

## 2024-04-08 NOTE — Progress Notes (Signed)
 [facility] SURGICAL PROGRESS NOTE (cpt [phone])  Hospital Day(s): 2.   Interval History: Patient seen and examined, no acute events or new complaints overnight. Patient reports she is doing well. Denied any abdominal pain. No nausea or emesis. She had tolerated bowel prep without issue. Colonoscopy reviewed and concerning for cecal mass. She was seen by IR for possible biopsy of liver lesions; however, she wishes to discuss with Dr Babara and is considering going to [facility] for her care. Diet was resumed without issue.   Review of Systems:  Constitutional: denies fever, chills  HEENT: denies cough or congestion  Respiratory: denies any shortness of breath  Cardiovascular: denies chest pain or palpitations  Gastrointestinal: denies abdominal pain, N/V Genitourinary: denies burning with urination or urinary frequency Musculoskeletal: denies pain, decreased motor or sensation  Vital signs in last 24 hours: [min-max] current  Temp:  [96.5 F (35.8 C)-98.4 F (36.9 C)] 96.5 F (35.8 C) (09/26 0948) Pulse Rate:  [75-103] 98 (09/26 1007) Resp:  [18-23] 18 (09/26 1007) BP: (124-152)/(58-86) 152/82 (09/26 1007) SpO2:  [97 %-99 %] 99 % (09/26 1007)     Height: 5' 9 (175.3 cm) Weight: 108 kg BMI (Calculated): 35.13   Intake/Output last 2 shifts:  09/25 0701 - 09/26 0700 In: 100 [IV Piggyback:100] Out: -    Physical Exam:  Constitutional: alert, cooperative and no distress  HENT: normocephalic without obvious abnormality  Eyes: PERRL, EOM's grossly intact and symmetric  Respiratory: breathing non-labored at rest  Cardiovascular: regular rate and sinus rhythm  Gastrointestinal: soft, non-tender, and non-distended, no rebound/guarding Musculoskeletal: no edema or wounds, motor and sensation grossly intact, NT    Labs:     Latest Ref Rng & Units 04/07/2024    6:24 AM 04/06/2024    3:41 PM 04/06/2024   11:24 AM  CBC  WBC 4.0 - 10.5 K/uL 10.3  14.9  14.4   Hemoglobin 12.0 - 15.0  g/dL 89.6  88.1  88.6   Hematocrit 36.0 - 46.0 % 31.5  35.7  35.3   Platelets 150 - 400 K/uL 307  316  316       Latest Ref Rng & Units 04/07/2024    6:24 AM 04/06/2024    3:41 PM 04/06/2024   11:24 AM  CMP  Glucose 70 - 99 mg/dL 82  894  896   BUN 6 - 20 mg/dL 9  7  9    Creatinine 0.44 - 1.00 mg/dL 9.07  8.96  8.88   Sodium 135 - 145 mmol/L 140  138  136   Potassium 3.5 - 5.1 mmol/L 3.6  3.8  3.5   Chloride 98 - 111 mmol/L 109  103  103   CO2 22 - 32 mmol/L 23  23  22    Calcium  8.9 - 10.3 mg/dL 8.1  8.6  8.8   Total Protein 6.5 - 8.1 g/dL  7.3  7.3   Total Bilirubin 0.0 - 1.2 mg/dL  1.1  0.9   Alkaline Phos 38 - 126 U/L  54  56   AST 15 - 41 U/L  18  16   ALT 0 - 44 U/L  12  13      Imaging studies: No new imaging studies  Assessment/Plan:  43 y.o. 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 Her abdominal examination is also benign. She wishes to discuss potential of undergoing further care at [facility] for this which is reasonable. We will be happy to remain a resource for her if she wishes.  - Appreciate GI evaluation for endoscopic evaluation of this mass and biopsy; follow up pathology   - Appreciate oncology evaluation; CEA pending  - Monitor abdominal examination   - Further management per primary service  All of the above findings and recommendations were discussed with the patient, patient's family, and the medical team, and all of patient's and family's questions were answered to their expressed satisfaction.  -- Arthea Platt, PA-C [facility] 04/08/2024, 2:14 PM M-F: 7am - 4pm

## 2024-04-09 LAB — THYROID PANEL WITH TSH
Free Thyroxine Index: 1.7 (ref 1.2–4.9)
T3 Uptake Ratio: 21 % — ABNORMAL LOW (ref 24–39)
T4, Total: 8.2 ug/dL (ref 4.5–12.0)
TSH: 0.56 u[IU]/mL (ref 0.450–4.500)

## 2024-04-09 LAB — CULTURE, BLOOD (ROUTINE X 2)

## 2024-04-09 LAB — CEA: CEA: 32.8 ng/mL — ABNORMAL HIGH (ref 0.0–4.7)

## 2024-04-11 ENCOUNTER — Telehealth: Payer: Self-pay

## 2024-04-11 LAB — SURGICAL PATHOLOGY

## 2024-04-11 NOTE — Transitions of Care (Post Inpatient/ED Visit) (Signed)
   04/11/2024  Name: Amber Price MRN: [id number] DOB: January 05, 1981  Today's TOC FU Call Status: Today's TOC FU Call Status:: Unsuccessful Call (1st Attempt) Unsuccessful Call (1st Attempt) Date: 04/11/24  Attempted to reach the patient regarding the most recent Inpatient/ED visit.  Follow Up Plan: Additional outreach attempts will be made to reach the patient to complete the Transitions of Care (Post Inpatient/ED visit) call.   Medford Balboa, BSN, RN [place]  [facility] - [employer] [facility] RN Care Manager [phone]

## 2024-04-11 NOTE — Transitions of Care (Post Inpatient/ED Visit) (Signed)
   04/11/2024  Name: FLOSSIE WEXLER MRN: [id number] DOB: 1981-06-28  Today's TOC FU Call Status: Today's TOC FU Call Status:: Unsuccessful Call (2nd Attempt) Unsuccessful Call (1st Attempt) Date: 04/11/24 Unsuccessful Call (2nd Attempt) Date: 04/11/24  Attempted to reach the patient regarding the most recent Inpatient/ED visit.  Follow Up Plan: Additional outreach attempts will be made to reach the patient to complete the Transitions of Care (Post Inpatient/ED visit) call.   Medford Balboa, BSN, RN [place]  [facility] - [employer] [facility] RN Care Manager [phone]

## 2024-04-11 NOTE — Telephone Encounter (Signed)
 Called notified on apt Fri 10/3 at 10a to see Dr.Yu for biopsy results

## 2024-04-12 ENCOUNTER — Telehealth: Payer: Self-pay

## 2024-04-12 ENCOUNTER — Ambulatory Visit: Payer: Self-pay | Admitting: Internal Medicine

## 2024-04-12 ENCOUNTER — Ambulatory Visit (INDEPENDENT_AMBULATORY_CARE_PROVIDER_SITE_OTHER): Admitting: Internal Medicine

## 2024-04-12 ENCOUNTER — Encounter: Payer: Self-pay | Admitting: Internal Medicine

## 2024-04-12 VITALS — BP 146/98 | HR 112 | Ht 69.0 in | Wt 237.8 lb

## 2024-04-12 DIAGNOSIS — M05712 Rheumatoid arthritis with rheumatoid factor of left shoulder without organ or systems involvement: Secondary | ICD-10-CM

## 2024-04-12 DIAGNOSIS — E782 Mixed hyperlipidemia: Secondary | ICD-10-CM

## 2024-04-12 DIAGNOSIS — M05711 Rheumatoid arthritis with rheumatoid factor of right shoulder without organ or systems involvement: Secondary | ICD-10-CM

## 2024-04-12 DIAGNOSIS — R7303 Prediabetes: Secondary | ICD-10-CM

## 2024-04-12 DIAGNOSIS — I10 Essential (primary) hypertension: Secondary | ICD-10-CM

## 2024-04-12 NOTE — Progress Notes (Signed)
 Established Patient Office Visit  Subjective:  Patient ID: Amber Price, female    DOB: [birth year]  Age: 43 y.o. MRN: [id number]  Chief Complaint  Patient presents with   Follow-up    3 month follow up    Patient comes in for hospital follow-up today.  She was admitted at [facility] from 04/06/2024 until 04/08/2024 for urosepsis.  She had gone for dysuria, back pain, and foul-smelling urine to the ED.  Her urine was found to be very abnormal and had a high WBC count.  Her initial CT scan of the abdomen pelvis showed a cecal mass, and lesions in the liver.  Patient was treated for E. coli bacteremia and discharged home on oral antibiotics, with instructions for further evaluation with a colonoscopy/biopsy and referral to oncology. Patient has already undergone colonoscopy with biopsy.  Will be discussing her biopsy results with oncologist later on this week. Today she is feeling better but still tired.  Denies nausea or vomiting, no chest pain and no shortness of breath.  She does not have any abdominal pain, no diarrhea or constipation.  Her blood pressure is above normal as she has not taken her medications yet. She is still waiting to get her mammogram for this year, order has been sent, she will call to schedule it now.    No other concerns at this time.   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 RECK 2018 ANNUAL   Sickle cell trait    Vitamin D  deficiency 01/2016   ADD VIT D3 5000 IU DAILY    Past Surgical History:  Procedure Laterality Date   COLONOSCOPY N/A 04/08/2024   Procedure: COLONOSCOPY;  Surgeon: Jinny Carmine, MD;  Location: [facility] ENDOSCOPY;  Service: Endoscopy;  Laterality: N/A;   NO PAST SURGERIES      Social History    Socioeconomic History   Marital status: Single    Spouse name: Not on file   Number of children: 3   Years of education: 14   Highest education level: Not on file  Occupational History   Occupation: BUSINESS OFFICE SPECIALIST  Tobacco Use   Smoking status: Never   Smokeless tobacco: Never  Vaping Use   Vaping status: Never Used  Substance and Sexual Activity   Alcohol use: Not Currently   Drug use: No   Sexual activity: Yes    Birth control/protection: Pill  Other Topics Concern   Not on file  Social History Narrative   Not on file   Social Drivers of Health   Financial Resource Strain: Medium Risk (04/06/2024)   [facility]   Overall Financial Resource Strain (CARDIA)    Difficulty of Paying Living Expenses: Somewhat hard  Food Insecurity: Food Insecurity Present (04/06/2024)   Received from [facility]   Hunger Vital Sign    Within the past 12 months, you worried that your food would run out before you got the money to buy more.: Sometimes true    Within the past 12 months, the food you bought just didn't last and you didn't have money to get more.: Never true  Transportation Needs: No Transportation Needs (04/06/2024)   Received from [facility] - Transportation    In the past 12 months, has lack of transportation kept you from medical appointments or from getting medications?: No    Lack of Transportation (Non-Medical): No  Physical Activity: Not on file  Stress: Not on file  Social Connections: Not on file  Intimate Partner Violence: Not At Risk (04/06/2024)   Humiliation, Afraid, Rape, and Kick questionnaire    Fear of Current or Ex-Partner: No    Emotionally Abused: No    Physically Abused: No    Sexually Abused: No    Family History  Problem Relation Age of Onset   Diabetes Mellitus II Mother    Hypertension Mother    Hypercholesterolemia Father    Cancer Paternal Grandmother  54       LUNG   Sickle cell trait Son     No Known Allergies  Outpatient Medications Prior to Visit  Medication Sig   amLODipine  (NORVASC ) 5 MG tablet Take 1 tablet (5 mg total) by mouth daily.   Cholecalciferol (VITAMIN D3) 1.25 MG (50000 UT) CAPS TAKE 1 CAPSULE BY MOUTH ONE TIME PER WEEK   folic acid (FOLVITE) 1 MG tablet Take 1 mg by mouth daily.   JUNEL  FE 1/20 1-20 MG-MCG tablet TAKE 1 TABLET BY MOUTH EVERY DAY   levofloxacin  (LEVAQUIN ) 750 MG tablet Take 1 tablet (750 mg total) by mouth daily for 4 days.   methotrexate (RHEUMATREX) 2.5 MG tablet Take 7.5 mg by mouth once a week.   rosuvastatin  (CRESTOR ) 20 MG tablet TAKE 1 TABLET BY MOUTH EVERY DAY   valACYclovir  (VALTREX ) 500 MG tablet Take 1 tablet (500 mg total) by mouth daily.   No facility-administered medications prior to visit.    Review of Systems  Constitutional:  Positive for malaise/fatigue. Negative for chills and fever.  HENT: Negative.  Negative for congestion and sore throat.   Eyes: Negative.  Negative for blurred vision and pain.  Respiratory: Negative.  Negative for cough and shortness of breath.   Cardiovascular: Negative.  Negative for chest pain, palpitations and leg swelling.  Gastrointestinal: Negative.  Negative for abdominal pain, blood in stool, constipation, diarrhea, heartburn, melena, nausea and vomiting.  Genitourinary: Negative.  Negative for dysuria, flank pain, frequency and urgency.  Musculoskeletal: Negative.  Negative for joint pain and myalgias.  Skin: Negative.   Neurological: Negative.  Negative for dizziness, tingling, sensory change, weakness and headaches.  Endo/Heme/Allergies: Negative.   Psychiatric/Behavioral: Negative.  Negative for depression and suicidal ideas. The patient is not nervous/anxious.        Objective:   BP (!) 146/98   Pulse (!) 112   Ht 5' 9 (1.753 m)   Wt 237 lb 12.8 oz (107.9 kg)   LMP 04/04/2024 (Exact Date)   SpO2 98%   BMI 35.12 kg/m   Vitals:    04/12/24 0938  BP: (!) 146/98  Pulse: (!) 112  Height: 5' 9 (1.753 m)  Weight: 237 lb 12.8 oz (107.9 kg)  SpO2: 98%  BMI (Calculated): 35.1    Physical Exam Vitals and nursing note reviewed.  Constitutional:      Appearance: Normal appearance.  HENT:     Head: Normocephalic and atraumatic.     Nose: Nose normal.     Mouth/Throat:     Mouth: Mucous membranes are moist.     Pharynx: Oropharynx is clear.  Eyes:     Conjunctiva/sclera: Conjunctivae  normal.     Pupils: Pupils are equal, round, and reactive to light.  Cardiovascular:     Rate and Rhythm: Normal rate and regular rhythm.     Pulses: Normal pulses.     Heart sounds: Normal heart sounds. No murmur heard. Pulmonary:     Effort: Pulmonary effort is normal.     Breath sounds: Normal breath sounds. No wheezing.  Abdominal:     General: Bowel sounds are normal.     Palpations: Abdomen is soft.     Tenderness: There is no abdominal tenderness. There is no right CVA tenderness or left CVA tenderness.  Musculoskeletal:        General: Normal range of motion.     Cervical back: Normal range of motion.     Right lower leg: No edema.     Left lower leg: No edema.  Skin:    General: Skin is warm and dry.  Neurological:     General: No focal deficit present.     Mental Status: She is alert and oriented to person, place, and time.  Psychiatric:        Mood and Affect: Mood normal.        Behavior: Behavior normal.      No results found for any visits on 04/12/24.  Recent Results (from the past 2160 hours)  CBC     Status: Abnormal   Collection Time: 04/06/24 11:24 AM  Result Value Ref Range   WBC 14.4 (H) 4.0 - 10.5 K/uL   RBC 4.50 3.87 - 5.11 MIL/uL   Hemoglobin 11.3 (L) 12.0 - 15.0 g/dL   HCT 64.6 (L) 63.9 - 53.9 %   MCV 78.4 (L) 80.0 - 100.0 fL   MCH 25.1 (L) 26.0 - 34.0 pg   MCHC 32.0 30.0 - 36.0 g/dL   RDW 85.0 88.4 - 84.4 %   Platelets 316 150 - 400 K/uL   nRBC 0.0 0.0 - 0.2 %    Comment: Performed  at [facility], [address] [place], [place] [address]  Troponin I (High Sensitivity)     Status: None   Collection Time: 04/06/24 11:24 AM  Result Value Ref Range   Troponin I (High Sensitivity) 3 <18 ng/L    Comment: (NOTE) Elevated high sensitivity troponin I (hsTnI) values and significant  changes across serial measurements may suggest ACS but many other  chronic and acute conditions are known to elevate hsTnI results.  Refer to the Links section for chest pain algorithms and additional  guidance. Performed at [facility], [address] [place], [place] [address]   Comprehensive metabolic panel     Status: Abnormal   Collection Time: 04/06/24 11:24 AM  Result Value Ref Range   Sodium 136 135 - 145 mmol/L   Potassium 3.5 3.5 - 5.1 mmol/L   Chloride 103 98 - 111 mmol/L   CO2 22 22 - 32 mmol/L   Glucose, Bld 103 (H) 70 - 99 mg/dL    Comment: Glucose reference range applies only to samples taken after fasting for at least 8 hours.   BUN 9 6 - 20 mg/dL   Creatinine, Ser 8.88 (H) 0.44 - 1.00 mg/dL   Calcium  8.8 (L) 8.9 - 10.3 mg/dL   Total Protein 7.3 6.5 - 8.1 g/dL   Albumin 3.3 (L) 3.5 - 5.0 g/dL   AST 16 15 - 41 U/L   ALT 13 0 - 44 U/L   Alkaline Phosphatase 56 38 -  126 U/L   Total Bilirubin 0.9 0.0 - 1.2 mg/dL   GFR, Estimated >39 >39 mL/min    Comment: (NOTE) Calculated using the CKD-EPI Creatinine Equation (2021)    Anion gap 11 5 - 15    Comment: Performed at [facility], [address] [place], [place] [address]  Lipase, blood     Status: None   Collection Time: 04/06/24 11:24 AM  Result Value Ref Range   Lipase 14 11 - 51 U/L    Comment: Performed at [facility], [address], [place], [place] [address]  Urinalysis, Routine w reflex microscopic -Urine, Clean Catch     Status: Abnormal   Collection Time: 04/06/24 11:24 AM  Result Value Ref Range   Color, Urine YELLOW (A) YELLOW   APPearance HAZY (A)  CLEAR   Specific Gravity, Urine 1.006 1.005 - 1.030   pH 6.0 5.0 - 8.0   Glucose, UA NEGATIVE NEGATIVE mg/dL   Hgb urine dipstick MODERATE (A) NEGATIVE   Bilirubin Urine NEGATIVE NEGATIVE   Ketones, ur NEGATIVE NEGATIVE mg/dL   Protein, ur 30 (A) NEGATIVE mg/dL   Nitrite POSITIVE (A) NEGATIVE   Leukocytes,Ua MODERATE (A) NEGATIVE   RBC / HPF 6-10 0 - 5 RBC/hpf   WBC, UA >50 0 - 5 WBC/hpf   Bacteria, UA MANY (A) NONE SEEN   Squamous Epithelial / HPF 0-5 0 - 5 /HPF   WBC Clumps PRESENT    Mucus PRESENT     Comment: Performed at [facility], [address] [place], [place] [address]  Urine Culture     Status: Abnormal   Collection Time: 04/06/24 11:24 AM   Specimen: Urine, Clean Catch  Result Value Ref Range   Specimen Description      URINE, CLEAN CATCH Performed at [facility], [address], [place], [place] [address]    Special Requests      NONE Performed at [facility], [address] [place], [place] [address]    Culture >=100,000 COLONIES/mL ESCHERICHIA COLI (A)    Report Status 04/08/2024 FINAL    Organism ID, Bacteria ESCHERICHIA COLI (A)       Susceptibility   Escherichia coli - MIC*    AMPICILLIN 4 SENSITIVE Sensitive     CEFAZOLIN (URINE) Value in next row Sensitive      2 SENSITIVEThis is a modified FDA-approved test that has been validated and its performance characteristics determined by the reporting laboratory.  This laboratory is certified under the Clinical Laboratory Improvement Amendments CLIA as qualified to perform high complexity clinical laboratory testing.    CEFEPIME  Value in next row Sensitive      2 SENSITIVEThis is a modified FDA-approved test that has been validated and its performance characteristics determined by the reporting laboratory.  This laboratory is certified under the Clinical Laboratory Improvement Amendments CLIA as qualified to perform high complexity clinical laboratory testing.    ERTAPENEM Value  in next row Sensitive      2 SENSITIVEThis is a modified FDA-approved test that has been validated and its performance characteristics determined by the reporting laboratory.  This laboratory is certified under the Clinical Laboratory Improvement Amendments CLIA as qualified to perform high complexity clinical laboratory testing.    CEFTRIAXONE  Value in next row Sensitive      2 SENSITIVEThis is a modified FDA-approved test that has been validated and its performance characteristics determined by the reporting laboratory.  This laboratory is certified under the Clinical  Laboratory Improvement Amendments CLIA as qualified to perform high complexity clinical laboratory testing.    CIPROFLOXACIN Value in next row Sensitive      2 SENSITIVEThis is a modified FDA-approved test that has been validated and its performance characteristics determined by the reporting laboratory.  This laboratory is certified under the Clinical Laboratory Improvement Amendments CLIA as qualified to perform high complexity clinical laboratory testing.    GENTAMICIN Value in next row Sensitive      2 SENSITIVEThis is a modified FDA-approved test that has been validated and its performance characteristics determined by the reporting laboratory.  This laboratory is certified under the Clinical Laboratory Improvement Amendments CLIA as qualified to perform high complexity clinical laboratory testing.    NITROFURANTOIN Value in next row Sensitive      2 SENSITIVEThis is a modified FDA-approved test that has been validated and its performance characteristics determined by the reporting laboratory.  This laboratory is certified under the Clinical Laboratory Improvement Amendments CLIA as qualified to perform high complexity clinical laboratory testing.    TRIMETH/SULFA Value in next row Sensitive      2 SENSITIVEThis is a modified FDA-approved test that has been validated and its performance characteristics determined by the reporting  laboratory.  This laboratory is certified under the Clinical Laboratory Improvement Amendments CLIA as qualified to perform high complexity clinical laboratory testing.    AMPICILLIN/SULBACTAM Value in next row Sensitive      2 SENSITIVEThis is a modified FDA-approved test that has been validated and its performance characteristics determined by the reporting laboratory.  This laboratory is certified under the Clinical Laboratory Improvement Amendments CLIA as qualified to perform high complexity clinical laboratory testing.    PIP/TAZO Value in next row Sensitive      <=4 SENSITIVEThis is a modified FDA-approved test that has been validated and its performance characteristics determined by the reporting laboratory.  This laboratory is certified under the Clinical Laboratory Improvement Amendments CLIA as qualified to perform high complexity clinical laboratory testing.    MEROPENEM Value in next row Sensitive      <=4 SENSITIVEThis is a modified FDA-approved test that has been validated and its performance characteristics determined by the reporting laboratory.  This laboratory is certified under the Clinical Laboratory Improvement Amendments CLIA as qualified to perform high complexity clinical laboratory testing.    * >=100,000 COLONIES/mL ESCHERICHIA COLI  POC urine preg, ED     Status: None   Collection Time: 04/06/24 12:39 PM  Result Value Ref Range   Preg Test, Ur Negative Negative  Blood culture (routine x 2)     Status: Abnormal   Collection Time: 04/06/24  2:24 PM   Specimen: BLOOD  Result Value Ref Range   Specimen Description      BLOOD RIGHT ANTECUBITAL Performed at [facility], [address] [place], [place] [address]    Special Requests      BOTTLES DRAWN AEROBIC AND ANAEROBIC Blood Culture results may not be optimal due to an inadequate volume of blood received in culture bottles Performed at [facility], [address], [place], [place] [address]     Culture  Setup Time      GRAM NEGATIVE RODS IN BOTH AEROBIC AND ANAEROBIC BOTTLES CRITICAL VALUE NOTED.  VALUE IS CONSISTENT WITH PREVIOUSLY REPORTED AND CALLED VALUE. Performed at [facility], [address] [place], [place] [address]    Culture (A)     ESCHERICHIA COLI SUSCEPTIBILITIES PERFORMED ON PREVIOUS CULTURE WITHIN THE  LAST 5 DAYS. Performed at [facility], [address], [place], [place] [address]    Report Status 04/09/2024 FINAL   Blood culture (routine x 2)     Status: Abnormal   Collection Time: 04/06/24  2:24 PM   Specimen: BLOOD  Result Value Ref Range   Specimen Description      BLOOD BLOOD LEFT HAND Performed at [facility], [address] [place], [place] [address]    Special Requests      BOTTLES DRAWN AEROBIC AND ANAEROBIC Blood Culture results may not be optimal due to an inadequate volume of blood received in culture bottles Performed at [facility], [address] [place], [place] [address]    Culture  Setup Time      GRAM NEGATIVE RODS IN BOTH AEROBIC AND ANAEROBIC BOTTLES CRITICAL RESULT CALLED TO, READ BACK BY AND VERIFIED WITH:  JASON ROBBINS AT 0152 04/07/24 JG Performed at [facility], [address], [place], [place] [address]    Culture ESCHERICHIA COLI (A)    Report Status 04/09/2024 FINAL    Organism ID, Bacteria ESCHERICHIA COLI       Susceptibility   Escherichia coli - MIC*    AMPICILLIN 4 SENSITIVE Sensitive     CEFAZOLIN (NON-URINE) 2 SENSITIVE Sensitive     CEFEPIME  <=0.12 SENSITIVE Sensitive     ERTAPENEM <=0.12 SENSITIVE Sensitive     CEFTRIAXONE  <=0.25 SENSITIVE Sensitive     CIPROFLOXACIN <=0.06 SENSITIVE Sensitive     GENTAMICIN <=1 SENSITIVE Sensitive     MEROPENEM <=0.25 SENSITIVE Sensitive     TRIMETH/SULFA <=20 SENSITIVE Sensitive     AMPICILLIN/SULBACTAM <=2 SENSITIVE Sensitive     PIP/TAZO Value in next row Sensitive      <=4 SENSITIVEThis is a modified FDA-approved test  that has been validated and its performance characteristics determined by the reporting laboratory.  This laboratory is certified under the Clinical Laboratory Improvement Amendments CLIA as qualified to perform high complexity clinical laboratory testing.    * ESCHERICHIA COLI  Blood Culture ID Panel (Reflexed)     Status: Abnormal   Collection Time: 04/06/24  2:24 PM  Result Value Ref Range   Enterococcus faecalis NOT DETECTED NOT DETECTED   Enterococcus Faecium NOT DETECTED NOT DETECTED   Listeria monocytogenes NOT DETECTED NOT DETECTED   Staphylococcus species NOT DETECTED NOT DETECTED   Staphylococcus aureus (BCID) NOT DETECTED NOT DETECTED   Staphylococcus epidermidis NOT DETECTED NOT DETECTED   Staphylococcus lugdunensis NOT DETECTED NOT DETECTED   Streptococcus species NOT DETECTED NOT DETECTED   Streptococcus agalactiae NOT DETECTED NOT DETECTED   Streptococcus pneumoniae NOT DETECTED NOT DETECTED   Streptococcus pyogenes NOT DETECTED NOT DETECTED   A.calcoaceticus-baumannii NOT DETECTED NOT DETECTED   Bacteroides fragilis NOT DETECTED NOT DETECTED   Enterobacterales DETECTED (A) NOT DETECTED    Comment: Enterobacterales represent a large order of gram negative bacteria, not a single organism. CRITICAL RESULT CALLED TO, READ BACK BY AND VERIFIED WITH:  JASON ROBBINS AT 0152 04/07/24 JG    Enterobacter cloacae complex NOT DETECTED NOT DETECTED   Escherichia coli DETECTED (A) NOT DETECTED    Comment: CRITICAL RESULT CALLED TO, READ BACK BY AND VERIFIED WITH:  JASON ROBBINS AT 9847 04/07/24 JG    Klebsiella aerogenes NOT DETECTED NOT DETECTED   Klebsiella oxytoca NOT DETECTED NOT DETECTED   Klebsiella pneumoniae NOT DETECTED NOT DETECTED   Proteus species NOT DETECTED NOT DETECTED   Salmonella species NOT DETECTED  NOT DETECTED   Serratia marcescens NOT DETECTED NOT DETECTED   Haemophilus influenzae NOT DETECTED NOT DETECTED   Neisseria meningitidis NOT DETECTED NOT DETECTED    Pseudomonas aeruginosa NOT DETECTED NOT DETECTED   Stenotrophomonas maltophilia NOT DETECTED NOT DETECTED   Candida albicans NOT DETECTED NOT DETECTED   Candida auris NOT DETECTED NOT DETECTED   Candida glabrata NOT DETECTED NOT DETECTED   Candida krusei NOT DETECTED NOT DETECTED   Candida parapsilosis NOT DETECTED NOT DETECTED   Candida tropicalis NOT DETECTED NOT DETECTED   Cryptococcus neoformans/gattii NOT DETECTED NOT DETECTED   CTX-M ESBL NOT DETECTED NOT DETECTED   Carbapenem resistance IMP NOT DETECTED NOT DETECTED   Carbapenem resistance KPC NOT DETECTED NOT DETECTED   Carbapenem resistance NDM NOT DETECTED NOT DETECTED   Carbapenem resist OXA 48 LIKE NOT DETECTED NOT DETECTED   Carbapenem resistance VIM NOT DETECTED NOT DETECTED    Comment: Performed at [facility], [address] [place], [place] [address]  Lactic acid, plasma     Status: None   Collection Time: 04/06/24  3:11 PM  Result Value Ref Range   Lactic Acid, Venous 1.0 0.5 - 1.9 mmol/L    Comment: Performed at [facility], [address] [place], [place] [address]  Protime-INR     Status: None   Collection Time: 04/06/24  3:11 PM  Result Value Ref Range   Prothrombin Time 13.7 11.4 - 15.2 seconds   INR 1.0 0.8 - 1.2    Comment: (NOTE) INR goal varies based on device and disease states. Performed at [facility], [address] [place], [place] [address]   APTT     Status: None   Collection Time: 04/06/24  3:11 PM  Result Value Ref Range   aPTT 30 24 - 36 seconds    Comment: Performed at [facility], [address] [place], [place] [address]  Lactic acid, plasma     Status: None   Collection Time: 04/06/24  3:41 PM  Result Value Ref Range   Lactic Acid, Venous 1.2 0.5 - 1.9 mmol/L    Comment: Performed at [facility], [address] [place], [place] [address]  CBC with Differential/Platelet     Status: Abnormal   Collection Time: 04/06/24  3:41 PM   Result Value Ref Range   WBC 14.9 (H) 4.0 - 10.5 K/uL   RBC 4.61 3.87 - 5.11 MIL/uL   Hemoglobin 11.8 (L) 12.0 - 15.0 g/dL   HCT 64.2 (L) 63.9 - 53.9 %   MCV 77.4 (L) 80.0 - 100.0 fL   MCH 25.6 (L) 26.0 - 34.0 pg   MCHC 33.1 30.0 - 36.0 g/dL   RDW 84.8 88.4 - 84.4 %   Platelets 316 150 - 400 K/uL   nRBC 0.0 0.0 - 0.2 %   Neutrophils Relative % 90 %   Neutro Abs 13.4 (H) 1.7 - 7.7 K/uL   Lymphocytes Relative 6 %   Lymphs Abs 0.9 0.7 - 4.0 K/uL   Monocytes Relative 3 %   Monocytes Absolute 0.5 0.1 - 1.0 K/uL   Eosinophils Relative 0 %   Eosinophils Absolute 0.0 0.0 - 0.5 K/uL   Basophils Relative 0 %   Basophils Absolute 0.0 0.0 - 0.1 K/uL   Immature Granulocytes 1 %   Abs Immature Granulocytes 0.07 0.00 - 0.07 K/uL    Comment: Performed at [facility], [address] [place], [place] [address]  Comprehensive metabolic panel with  GFR     Status: Abnormal   Collection Time: 04/06/24  3:41 PM  Result Value Ref Range   Sodium 138 135 - 145 mmol/L   Potassium 3.8 3.5 - 5.1 mmol/L   Chloride 103 98 - 111 mmol/L   CO2 23 22 - 32 mmol/L   Glucose, Bld 105 (H) 70 - 99 mg/dL    Comment: Glucose reference range applies only to samples taken after fasting for at least 8 hours.   BUN 7 6 - 20 mg/dL   Creatinine, Ser 8.96 (H) 0.44 - 1.00 mg/dL   Calcium  8.6 (L) 8.9 - 10.3 mg/dL   Total Protein 7.3 6.5 - 8.1 g/dL   Albumin 3.1 (L) 3.5 - 5.0 g/dL   AST 18 15 - 41 U/L   ALT 12 0 - 44 U/L   Alkaline Phosphatase 54 38 - 126 U/L   Total Bilirubin 1.1 0.0 - 1.2 mg/dL   GFR, Estimated >39 >39 mL/min    Comment: (NOTE) Calculated using the CKD-EPI Creatinine Equation (2021)    Anion gap 12 5 - 15    Comment: Performed at [facility], [address] [place], [place] [address]  Lactic acid, plasma     Status: None   Collection Time: 04/06/24  4:35 PM  Result Value Ref Range   Lactic Acid, Venous 1.1 0.5 - 1.9 mmol/L    Comment: Performed at [facility],  [address] [place], [place] [address]  Thyroid  Panel With TSH     Status: Abnormal   Collection Time: 04/06/24  4:35 PM  Result Value Ref Range   TSH 0.560 0.450 - 4.500 uIU/mL   T4, Total 8.2 4.5 - 12.0 ug/dL   T3 Uptake Ratio 21 (L) 24 - 39 %   Free Thyroxine Index 1.7 1.2 - 4.9    Comment: (NOTE) Performed At: [facility] [address] [facility] [address] [place], [place] [id number] Jennette Shorter MD Ey:[id number]   Troponin I (High Sensitivity)     Status: None   Collection Time: 04/06/24  4:35 PM  Result Value Ref Range   Troponin I (High Sensitivity) 4 <18 ng/L    Comment: (NOTE) Elevated high sensitivity troponin I (hsTnI) values and significant  changes across serial measurements may suggest ACS but many other  chronic and acute conditions are known to elevate hsTnI results.  Refer to the Links section for chest pain algorithms and additional  guidance. Performed at [facility], [address] [place], [place] [address]   CEA     Status: Abnormal   Collection Time: 04/06/24  4:38 PM  Result Value Ref Range   CEA 32.8 (H) 0.0 - 4.7 ng/mL    Comment: (NOTE)                             Nonsmokers          <3.9                             Smokers             <5.6 [employer] Electrochemiluminescence Immunoassay (ECLIA) Values obtained with different assay methods or kits cannot be used interchangeably.  Results cannot be interpreted as absolute evidence of the presence or absence of malignant disease. Performed At: [facility] [address] [place], [place] [id number] Jennette Shorter MD Ey:[phone]   Lactic acid,  plasma     Status: None   Collection Time: 04/06/24  5:57 PM  Result Value Ref Range   Lactic Acid, Venous 1.3 0.5 - 1.9 mmol/L    Comment: Performed at [facility], [address] [place], [place] [address]  Troponin I (High Sensitivity)     Status: None   Collection Time: 04/06/24  5:57 PM  Result Value Ref Range    Troponin I (High Sensitivity) 8 <18 ng/L    Comment: (NOTE) Elevated high sensitivity troponin I (hsTnI) values and significant  changes across serial measurements may suggest ACS but many other  chronic and acute conditions are known to elevate hsTnI results.  Refer to the Links section for chest pain algorithms and additional  guidance. Performed at [facility], [address] [place], [place] [address]   ECHOCARDIOGRAM COMPLETE     Status: None   Collection Time: 04/06/24  8:42 PM  Result Value Ref Range   Weight 3,808 oz   Height 69 in   BP 159/87 mmHg   S' Lateral 2.30 cm   Est EF 60 - 65%   CBC     Status: Abnormal   Collection Time: 04/07/24  6:24 AM  Result Value Ref Range   WBC 10.3 4.0 - 10.5 K/uL   RBC 4.06 3.87 - 5.11 MIL/uL   Hemoglobin 10.3 (L) 12.0 - 15.0 g/dL   HCT 68.4 (L) 63.9 - 53.9 %   MCV 77.6 (L) 80.0 - 100.0 fL   MCH 25.4 (L) 26.0 - 34.0 pg   MCHC 32.7 30.0 - 36.0 g/dL   RDW 84.9 88.4 - 84.4 %   Platelets 307 150 - 400 K/uL   nRBC 0.0 0.0 - 0.2 %    Comment: Performed at [facility], [address], [place], [place] [address]  Basic metabolic panel     Status: Abnormal   Collection Time: 04/07/24  6:24 AM  Result Value Ref Range   Sodium 140 135 - 145 mmol/L   Potassium 3.6 3.5 - 5.1 mmol/L   Chloride 109 98 - 111 mmol/L   CO2 23 22 - 32 mmol/L   Glucose, Bld 82 70 - 99 mg/dL    Comment: Glucose reference range applies only to samples taken after fasting for at least 8 hours.   BUN 9 6 - 20 mg/dL   Creatinine, Ser 9.07 0.44 - 1.00 mg/dL   Calcium  8.1 (L) 8.9 - 10.3 mg/dL   GFR, Estimated >39 >39 mL/min    Comment: (NOTE) Calculated using the CKD-EPI Creatinine Equation (2021)    Anion gap 8 5 - 15    Comment: Performed at [facility], [address], [place], [place] [address]  Surgical pathology     Status: None   Collection Time: 04/08/24 12:00 AM  Result Value Ref Range   SURGICAL PATHOLOGY       SURGICAL PATHOLOGY [facility] [address], [address] [place], [place] [address] Telephone [phone] or [phone] Fax [phone]  REPORT OF SURGICAL PATHOLOGY   Accession #: [id number] Patient Name: KAYZLEE, WIRTANEN Visit # : [id number]  MRN: [id number] Physician: Jinny Carmine DOB/Age [birth year] (Age: 31) Gender: F Collected Date: 04/08/2024 Received Date: 04/08/2024  FINAL DIAGNOSIS       1. Cecum Biopsy, cbx mass :       - INVASIVE MODERATELY DIFFERENTIATED ADENOCARCINOMA, SEE NOTE       Diagnosis Note : Dr. LeGolvan reviewed the case  and concurs with the above      diagnosis.  Immunohistochemical stains for MMR-related proteins are pending and      will be reported in an addendum.  Dr. Jinny was notified on 04/11/2024.      DATE SIGNED OUT: 04/11/2024 ELECTRONIC SIGNATURE : Rebbecca Md, Nilesh, Pathologist, Electronic Signature  MICROSCOPIC DESCRIPTION  CASE COMMENTS STAINS USED IN DIAGNOSIS: H&E MLH1 MSH2 MSH6 PMS2     CLINICAL HISTORY  SPECIMEN(S) OBTAINED 1. Cecum Biopsy, Cbx Mass  SPECIMEN COMMENTS: SPECIMEN CLINICAL INFORMATION: 1. Cecal mass    Gross Description 1. Received in formalin are tan, soft tissue fragments that are submitted in toto.  Number:  5  Size:  0.3 cm, 1 block. mb 04-08-24        Report signed out from the following location(s) [facility] [address] [place], [place] [address] CLIA #: [id number]  [facility] [address] [place], [place] [address] CLIA #: [id number]   Ferritin     Status: None   Collection Time: 04/08/24  8:35 AM  Result Value Ref Range   Ferritin 63 11 - 307 ng/mL    Comment: Performed at [facility], [address] [place], [place] [address]  Iron and TIBC     Status: Abnormal   Collection Time: 04/08/24  8:35 AM  Result Value Ref Range   Iron 20 (L) 28 - 170 ug/dL   TIBC 622 749 - 549 ug/dL   Saturation Ratios 5  (L) 10.4 - 31.8 %   UIBC 357 ug/dL    Comment: Performed at [facility], [address], [place], [place] [address]      Assessment & Plan:  Continue current medications.  Patient advised to start taking her blood pressure medications regularly.  Encourage p.o. hydration. 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   Total time spent: 30 minutes  FERNAND FREDY RAMAN, MD  04/12/2024   This document may have been prepared by [facility] Voice Recognition software and as such may include unintentional dictation errors.

## 2024-04-12 NOTE — Transitions of Care (Post Inpatient/ED Visit) (Signed)
   04/12/2024  Name: Amber Price MRN: [id number] DOB: 08/05/1980  Today's TOC FU Call Status: Today's TOC FU Call Status:: Successful TOC FU Call Completed TOC FU Call Complete Date: 04/12/24 Patient's Name and Date of Birth confirmed.  Transition Care Management Follow-up Telephone Call Date of Discharge: 04/08/24 Discharge Facility: [facility]) Type of Discharge: Inpatient Admission Primary Inpatient Discharge Diagnosis:: Sepsis with UTI How have you been since you were released from the hospital?: Better Any questions or concerns?: No  Items Reviewed: Did you receive and understand the discharge instructions provided?: Yes Medications obtained,verified, and reconciled?: Yes (Medications Reviewed) Any new allergies since your discharge?: No Dietary orders reviewed?: NA Do you have support at home?: Yes People in Home [RPT]: significant other Name of Support/Comfort Primary Source: Jerrel Graves  Medications Reviewed Today: Medications Reviewed Today     Reviewed by Moises Reusing, RN (Case Manager) on 04/12/24 at 1439  Med List Status: <None>   Medication Order Taking? Sig Documenting Provider Last Dose Status Informant  amLODipine  (NORVASC ) 5 MG tablet [id number]  Take 1 tablet (5 mg total) by mouth daily. Fernand Fredy RAMAN, MD  Active Self  Cholecalciferol (VITAMIN D3) 1.25 MG (50000 UT) CAPS [id number]  TAKE 1 CAPSULE BY MOUTH ONE TIME PER WEEK Khan, Neelam S, MD  Active Self  folic acid (FOLVITE) 1 MG tablet [id number]  Take 1 mg by mouth daily. [provider]  Active Self  JUNEL  FE 1/20 1-20 MG-MCG tablet [id number]  TAKE 1 TABLET BY MOUTH EVERY DAY Orlean Alan CHRISTELLA, FNP  Active Self  levofloxacin  (LEVAQUIN ) 750 MG tablet [id number]  Take 1 tablet (750 mg total) by mouth daily for 4 days. Jens Durand, MD  Active   methotrexate [facility]) 2.5 MG tablet [id number]  Take 7.5 mg by mouth once a week. [provider]  Active Self            Med Note CATHY OVAL VEAR [place] Apr 07, 2024  5:07 PM) Tuesday   rosuvastatin  (CRESTOR ) 20 MG tablet [id number]  TAKE 1 TABLET BY MOUTH EVERY DAY Orlean Alan CHRISTELLA, FNP  Active Self  valACYclovir  (VALTREX ) 500 MG tablet [id number]  Take 1 tablet (500 mg total) by mouth daily. Fernand Fredy RAMAN, MD  Active Self            Home Care and Equipment/Supplies: Were Home Health Services Ordered?: NA Any new equipment or medical supplies ordered?: NA  Functional Questionnaire: Do you need assistance with bathing/showering or dressing?: No Do you need assistance with meal preparation?: No Do you need assistance with eating?: No Do you have difficulty maintaining continence: No Do you need assistance with getting out of bed/getting out of a chair/moving?: No Do you have difficulty managing or taking your medications?: No  Follow up appointments reviewed: PCP Follow-up appointment confirmed?: Yes Date of PCP follow-up appointment?: 04/12/24 Follow-up Provider: [facility] Follow-up appointment confirmed?: Yes Date of Specialist follow-up appointment?: 04/15/24 Follow-Up Specialty Provider:: Dr. Babara Do you need transportation to your follow-up appointment?: No Do you understand care options if your condition(s) worsen?: Yes-patient verbalized understanding  SDOH Interventions Today    Flowsheet Row Most Recent Value  SDOH Interventions   Food Insecurity Interventions Intervention Not Indicated  Housing Interventions Intervention Not Indicated  Transportation Interventions Intervention Not Indicated  Utilities Interventions Intervention Not Indicated    Medford Moises, BSN, RN [place]  VBCI - [facility] RN Care Manager [phone]

## 2024-04-13 LAB — LIPID PANEL W/O CHOL/HDL RATIO
Cholesterol, Total: 207 mg/dL — ABNORMAL HIGH (ref 100–199)
HDL: 45 mg/dL (ref >39)
LDL Chol Calc (NIH): 141 mg/dL — ABNORMAL HIGH (ref 0–99)
Triglycerides: 115 mg/dL (ref 0–149)
VLDL Cholesterol Cal: 21 mg/dL (ref 5–40)

## 2024-04-13 LAB — HEMOGLOBIN A1C
Est. average glucose Bld gHb Est-mCnc: 126 mg/dL
Hgb A1c MFr Bld: 6 % — ABNORMAL HIGH (ref 4.8–5.6)

## 2024-04-14 ENCOUNTER — Ambulatory Visit: Payer: Self-pay | Admitting: Internal Medicine

## 2024-04-15 ENCOUNTER — Inpatient Hospital Stay: Attending: Oncology | Admitting: Oncology

## 2024-04-15 ENCOUNTER — Encounter: Payer: Self-pay | Admitting: Oncology

## 2024-04-15 ENCOUNTER — Ambulatory Visit: Admitting: Oncology

## 2024-04-15 VITALS — BP 140/83 | HR 98 | Temp 97.8°F | Resp 20 | Wt 237.8 lb

## 2024-04-15 DIAGNOSIS — C180 Malignant neoplasm of cecum: Secondary | ICD-10-CM | POA: Diagnosis not present

## 2024-04-15 DIAGNOSIS — D509 Iron deficiency anemia, unspecified: Secondary | ICD-10-CM | POA: Insufficient documentation

## 2024-04-15 DIAGNOSIS — C787 Secondary malignant neoplasm of liver and intrahepatic bile duct: Secondary | ICD-10-CM | POA: Diagnosis not present

## 2024-04-15 DIAGNOSIS — K6389 Other specified diseases of intestine: Secondary | ICD-10-CM

## 2024-04-15 DIAGNOSIS — C801 Malignant (primary) neoplasm, unspecified: Secondary | ICD-10-CM

## 2024-04-15 NOTE — Assessment & Plan Note (Signed)
 Recommend liver lesion biopsy. Referred to [facility] for evaluation.

## 2024-04-15 NOTE — Assessment & Plan Note (Signed)
 Lab Results  Component Value Date   HGB 10.3 (L) 04/07/2024   TIBC 377 04/08/2024   IRONPCTSAT 5 (L) 04/08/2024   FERRITIN 63 04/08/2024    Recommend oral iron supplementation.

## 2024-04-15 NOTE — Progress Notes (Signed)
 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 Baseline CEA was elevated at 32.8. pMMR  Recommend liver lesion biopsy to confirm staging. Recommend molecular profiling- NGS testing. Recommend genetic testing If liver metastasis are resectable, she may be able to have synchronous or staged resection at all sites.  She will also need systemic chemotherapy neoadjuvantly or adjuvantly. Patient would like to have a second opinion at [facility]. Refer patient to [facility] oncology.  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 Referred to [facility] for evaluation.  Iron deficiency anemia Lab Results  Component Value Date   HGB 10.3 (L) 04/07/2024   TIBC 377 04/08/2024   IRONPCTSAT 5 (L) 04/08/2024   FERRITIN 63 04/08/2024    Recommend oral iron supplementation.   Orders Placed This Encounter  Procedures   Ambulatory referral to Hematology / Oncology    Referral Priority:   Routine    Referral Type:   Consultation    Referral Reason:   Specialty Services Required    Requested Specialty:   Oncology    Number of Visits Requested:   1   Follow-up to be determined. All questions were answered. The patient knows to call the clinic with any problems, questions or concerns.  Zelphia Cap, MD, PhD [facility] Hematology Oncology 04/15/2024    HISTORY OF PRESENTING ILLNESS:  Amber Price 43 y.o. female presents to establish care for primary cecal adenocarcinoma I have reviewed her chart and materials related to her cancer extensively and collaborated history with the patient. Summary of oncologic history is as follows: Oncology History  Primary cancer of cecum (HCC)  04/06/2024  Imaging   CT abdomen pelvis with contrast showed 1. Asymmetric wall thickening and enhancement in the cecum, measuring 4.3 cm in craniocaudal dimension, and involving the appendiceal orifice (axial 50, sagittal 61), worrisome for underlying colonic neoplasm. Likely causing obstruction of the appendix, which is fluid-filled and dilated appendix, suggesting acute appendicitis. Surgical consultation recommended. 2. A couple of mildly prominent right ileocolic chain lymph nodes are present measuring up to 7 mm (axial 50-52). These could be reactive or metastatic. 3. There are 3 hypodense lesions in both lobes of the liver, measuring up to 1.8 cm, likely metastatic disease.     04/06/2024 Imaging   CT chest angiogram No evidence of pulm embolism or other acute intrathoracic process.   04/07/2024 Imaging   MRI of liver with and without contrast showed 1. Multiple bilateral liver lesions. 2 left hepatic lobe lesions are highly suspicious for metastasis. 1 right liver lobe lesion is indeterminate but favored to represent a hemangioma. 2 smaller right liver lobe lesions are favored to be benign, likely hemangiomas. Consider sampling of the segment 2 lesion versus further evaluation with PET. 2.  Cecal mass with adjacent adenopathy, suspicious for nodal metastasis.  04/08/2024, colonoscopy showed fungating partially obstructing large mass in the cecum. Biopsy pathology showed invasive moderately differentiated adenocarcinoma. pMMR    04/15/2024 Initial Diagnosis   Primary cancer of cecum   Patient presented to emergency room for evaluation of UTI symptoms.  She was found to have sepsis secondary to E. coli bacteremia, acute UTI.  Urine culture grew pansensitive E. coli.  Patient was treated with IV antibiotics and discharged on oral Levaquin .  CT obtained during hospitalization showed Asymmetric wall thickening and enhancement in the cecum , measuring 4.3 cm worrisome for underlying colonic  neoplasm.  There are 3 hypodense liver lesions in both lobes of the liver.  Mildly prominent right ileocolic chain lymph nodes. MRI liver showed multiple bilateral liver lesions.  2 left hepatic lobe lesions are highly suspicious for metastasis.  1 right liver lobe lesion is indeterminate but favored to represent a hemangioma.  2 smaller right liver lobe lesions are favored to be benign.  No new metastasis.   04/15/2024 Cancer Staging   Staging form: Colon and Rectum, AJCC 8th Edition - Clinical stage from 04/15/2024: Stage Unknown (cTX, cNX) - Signed by Babara Call, MD on 04/15/2024 Stage prefix: Initial diagnosis    Patient denies family history of colon cancer.  Paternal grandmother had lung cancer. She denies any rectal bleeding, melena or change of bowel habits.  Patient is on methotrexate for rheumatoid arthritis. Denies unintentional weight loss, fever, night sweats.  Patient presents to discuss results and management plan.  Accompanied by her aunt.    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 RECK 2018 ANNUAL   Sickle cell trait    Vitamin D  deficiency 01/2016   ADD VIT D3 5000 IU DAILY    SURGICAL HISTORY: Past Surgical History:  Procedure Laterality Date   COLONOSCOPY N/A 04/08/2024   Procedure: COLONOSCOPY;  Surgeon: Jinny Carmine, MD;  Location: [facility] ENDOSCOPY;  Service: Endoscopy;  Laterality: N/A;   NO PAST SURGERIES      SOCIAL HISTORY: Social History   Socioeconomic History   Marital status: Single    Spouse name: Not on file   Number of children: 3   Years of education: 14   Highest education level: Not on file  Occupational History   Occupation: BUSINESS OFFICE SPECIALIST  Tobacco Use    Smoking status: Never   Smokeless tobacco: Never  Vaping Use   Vaping status: Never Used  Substance and Sexual Activity   Alcohol use: Not Currently   Drug use: No   Sexual activity: Yes    Birth control/protection: Pill  Other Topics Concern   Not on file  Social History Narrative   Not on file   Social Drivers of Health   Financial Resource Strain: Medium Risk (04/06/2024)   Received from [facility]   Overall Financial Resource Strain (CARDIA)    Difficulty of Paying Living Expenses: Somewhat hard  Food Insecurity: No Food Insecurity (04/15/2024)   Hunger Vital Sign    Worried About Running Out of Food in the Last Year: Never true    Ran Out of Food in the Last Year: Never true  Recent Concern: Food Insecurity - Food Insecurity Present (04/12/2024)   Hunger Vital Sign    Worried About Running Out of Food in the Last Year: Sometimes true    Ran Out of Food in the Last Year: Never true  Transportation Needs: No Transportation Needs (04/15/2024)   PRAPARE - Administrator, Civil Service (Medical): No    Lack of Transportation (Non-Medical): No  Physical Activity: Not  on file  Stress: Not on file  Social Connections: Not on file  Intimate Partner Violence: Not At Risk (04/15/2024)   Humiliation, Afraid, Rape, and Kick questionnaire    Fear of Current or Ex-Partner: No    Emotionally Abused: No    Physically Abused: No    Sexually Abused: No    FAMILY HISTORY: Family History  Problem Relation Age of Onset   Diabetes Mellitus II Mother    Hypertension Mother    Hypercholesterolemia Father    Cancer Paternal Grandmother 19       LUNG   Sickle cell trait Son     ALLERGIES:  has no known allergies.  MEDICATIONS:  Current Outpatient Medications  Medication Sig Dispense Refill   amLODipine  (NORVASC ) 5 MG tablet Take 1 tablet (5 mg total) by mouth daily. 30 tablet 11   Cholecalciferol (VITAMIN D3) 1.25 MG (50000 UT) CAPS TAKE 1 CAPSULE BY  MOUTH ONE TIME PER WEEK 12 capsule 3   folic acid (FOLVITE) 1 MG tablet Take 1 mg by mouth daily.     JUNEL  FE 1/20 1-20 MG-MCG tablet TAKE 1 TABLET BY MOUTH EVERY DAY 84 tablet 1   methotrexate (RHEUMATREX) 2.5 MG tablet Take 7.5 mg by mouth once a week.     rosuvastatin  (CRESTOR ) 20 MG tablet TAKE 1 TABLET BY MOUTH EVERY DAY 90 tablet 3   valACYclovir  (VALTREX ) 500 MG tablet Take 1 tablet (500 mg total) by mouth daily. 90 tablet 3   No current facility-administered medications for this visit.    Review of Systems  Constitutional:  Negative for appetite change, chills, fatigue and fever.  HENT:   Negative for hearing loss and voice change.   Eyes:  Negative for eye problems.  Respiratory:  Negative for chest tightness and cough.   Cardiovascular:  Negative for chest pain.  Gastrointestinal:  Negative for abdominal distention, abdominal pain and blood in stool.  Endocrine: Negative for hot flashes.  Genitourinary:  Negative for difficulty urinating and frequency.   Musculoskeletal:  Negative for arthralgias.  Skin:  Negative for itching and rash.  Neurological:  Negative for extremity weakness.  Hematological:  Negative for adenopathy.  Psychiatric/Behavioral:  Negative for confusion.      PHYSICAL EXAMINATION: ECOG PERFORMANCE STATUS: 0 - Asymptomatic  Vitals:   04/15/24 0959  BP: (!) 140/83  Pulse: 98  Resp: 20  Temp: 97.8 F (36.6 C)  SpO2: 100%   Filed Weights   04/15/24 0959  Weight: 237 lb 12.8 oz (107.9 kg)    Physical Exam Constitutional:      General: She is not in acute distress.    Appearance: She is not diaphoretic.  HENT:     Head: Normocephalic and atraumatic.  Eyes:     General: No scleral icterus. Cardiovascular:     Rate and Rhythm: Normal rate.  Pulmonary:     Effort: Pulmonary effort is normal. No respiratory distress.  Abdominal:     General: Bowel sounds are normal. There is no distension.     Palpations: Abdomen is soft.   Musculoskeletal:        General: Normal range of motion.     Cervical back: Normal range of motion and neck supple.  Skin:    Findings: No erythema.  Neurological:     Mental Status: She is alert and oriented to person, place, and time. Mental status is at baseline.     Cranial Nerves: No cranial nerve deficit.     Motor:  No abnormal muscle tone.  Psychiatric:        Mood and Affect: Mood and affect normal.      LABORATORY DATA:  I have reviewed the data as listed    Latest Ref Rng & Units 04/07/2024    6:24 AM 04/06/2024    3:41 PM 04/06/2024   11:24 AM  CBC  WBC 4.0 - 10.5 K/uL 10.3  14.9  14.4   Hemoglobin 12.0 - 15.0 g/dL 89.6  88.1  88.6   Hematocrit 36.0 - 46.0 % 31.5  35.7  35.3   Platelets 150 - 400 K/uL 307  316  316       Latest Ref Rng & Units 04/07/2024    6:24 AM 04/06/2024    3:41 PM 04/06/2024   11:24 AM  CMP  Glucose 70 - 99 mg/dL 82  894  896   BUN 6 - 20 mg/dL 9  7  9    Creatinine 0.44 - 1.00 mg/dL 9.07  8.96  8.88   Sodium 135 - 145 mmol/L 140  138  136   Potassium 3.5 - 5.1 mmol/L 3.6  3.8  3.5   Chloride 98 - 111 mmol/L 109  103  103   CO2 22 - 32 mmol/L 23  23  22    Calcium  8.9 - 10.3 mg/dL 8.1  8.6  8.8   Total Protein 6.5 - 8.1 g/dL  7.3  7.3   Total Bilirubin 0.0 - 1.2 mg/dL  1.1  0.9   Alkaline Phos 38 - 126 U/L  54  56   AST 15 - 41 U/L  18  16   ALT 0 - 44 U/L  12  13      RADIOGRAPHIC STUDIES: I have personally reviewed the radiological images as listed and agreed with the findings in the report. ECHOCARDIOGRAM COMPLETE Result Date: 04/07/2024    ECHOCARDIOGRAM REPORT   Patient Name:   Amber Price Date of Exam: 04/06/2024 Medical Rec #:  [id number]      Height:       69.0 in Accession #:    [id number]     Weight:       238.0 lb Date of Birth:  July 15, 1980       BSA:          2.225 m Patient Age:    43 years       BP:           149/78 mmHg Patient Gender: F              HR:           127 bpm. Exam Location:  [facility] Procedure: 2D Echo, Cardiac  Doppler and Color Doppler (Both Spectral and Color            Flow Doppler were utilized during procedure). Indications:     R00.8 Other abnormalities of the heart  History:         Patient has no prior history of Echocardiogram examinations.                  Risk Factors:Hypertension.  Sonographer:     Jeyli Zwicker RDCS Referring Phys:  [id number] BRADLY POUR STEPHENS Diagnosing Phys: Evalene Lunger MD IMPRESSIONS  1. Left ventricular ejection fraction, by estimation, is 60 to 65%. The left ventricle has normal function. The left ventricle has no regional wall motion abnormalities. Left ventricular diastolic parameters are indeterminate.  2. Right ventricular systolic function is normal.  The right ventricular size is normal. Tricuspid regurgitation signal is inadequate for assessing PA pressure.  3. The mitral valve is normal in structure. No evidence of mitral valve regurgitation. No evidence of mitral stenosis.  4. The aortic valve is normal in structure. Aortic valve regurgitation is not visualized. No aortic stenosis is present.  5. The inferior vena cava is normal in size with greater than 50% respiratory variability, suggesting right atrial pressure of 3 mmHg. FINDINGS  Left Ventricle: Left ventricular ejection fraction, by estimation, is 60 to 65%. The left ventricle has normal function. The left ventricle has no regional wall motion abnormalities. Strain was performed and the global longitudinal strain is indeterminate. The left ventricular internal cavity size was normal in size. There is no left ventricular hypertrophy. Left ventricular diastolic parameters are indeterminate. Right Ventricle: The right ventricular size is normal. No increase in right ventricular wall thickness. Right ventricular systolic function is normal. Tricuspid regurgitation signal is inadequate for assessing PA pressure. Left Atrium: Left atrial size was normal in size. Right Atrium: Right atrial size was normal in size.  Pericardium: There is no evidence of pericardial effusion. Mitral Valve: The mitral valve is normal in structure. No evidence of mitral valve regurgitation. No evidence of mitral valve stenosis. Tricuspid Valve: The tricuspid valve is normal in structure. Tricuspid valve regurgitation is not demonstrated. No evidence of tricuspid stenosis. Aortic Valve: The aortic valve is normal in structure. Aortic valve regurgitation is not visualized. No aortic stenosis is present. Pulmonic Valve: The pulmonic valve was normal in structure. Pulmonic valve regurgitation is not visualized. No evidence of pulmonic stenosis. Aorta: The aortic root is normal in size and structure. Venous: The inferior vena cava is normal in size with greater than 50% respiratory variability, suggesting right atrial pressure of 3 mmHg. IAS/Shunts: No atrial level shunt detected by color flow Doppler. Additional Comments: 3D was performed not requiring image post processing on an independent workstation and was indeterminate.  LEFT VENTRICLE PLAX 2D LVIDd:         4.20 cm LVIDs:         2.30 cm LV PW:         1.10 cm LV IVS:        1.00 cm LVOT diam:     2.00 cm LV SV:         44 LV SV Index:   20 LVOT Area:     3.14 cm  RIGHT VENTRICLE RV Basal diam:  3.70 cm RV S prime:     19.70 cm/s TAPSE (M-mode): 2.1 cm LEFT ATRIUM             Index       RIGHT ATRIUM           Index LA diam:        2.90 cm 1.30 cm/m  RA Area:     10.50 cm LA Vol (A2C):   15.2 ml 6.83 ml/m  RA Volume:   25.40 ml  11.42 ml/m LA Vol (A4C):   15.1 ml 6.79 ml/m LA Biplane Vol: 15.4 ml 6.92 ml/m  AORTIC VALVE LVOT Vmax:   85.65 cm/s LVOT Vmean:  65.750 cm/s LVOT VTI:    0.142 m  AORTA Ao Root diam: 3.00 cm Ao Asc diam:  2.80 cm MV E velocity: 92.70 cm/s MV A velocity: 80.40 cm/s  SHUNTS MV E/A ratio:  1.15        Systemic VTI:  0.14 m  Systemic Diam: 2.00 cm Evalene Lunger MD Electronically signed by Evalene Lunger MD Signature Date/Time:  04/07/2024/2:56:27 PM    Final    MR LIVER W WO CONTRAST Result Date: 04/07/2024 EXAM: MRCP WITHOUT AND WITH IV CONTRAST 04/07/2024 09:30:07 AM TECHNIQUE: Multisequence, multiplanar magnetic resonance images of the abdomen without and with intravenous contrast. MRCP sequences were performed. 10 mL gadobutrol  (GADAVIST ) 1 MMOL/ML injection 10 mL GADOBUTROL  1 MMOL/ML IV SOLN was administered. COMPARISON: CT of yesterday. CLINICAL HISTORY: Liver lesion, > 1cm. Followup from CT. Eval liver mets? 10 gad given. No hx ca per patient. FINDINGS: LIVER: No significant steatosis. Mild hepatomegaly at 18.5 cm craniocaudal. Multiple bilateral liver lesions are identified. The most suspicious lesion is in the posterior aspect of segment 2 at 2.0 cm on 23 / 17. demonstrates early and delayed hypoenhancement as well as restricted diffusion on image 14 / 12. A segment 4b 6 mm hypoenhancing lesion on 38 / 17 is also suspicious for metastasis. Anterior right hepatic lobe 1.3 cm lesion on image 16 / 15 is moderately T2 hyperintense and demonstrates possible peripheral nodular enhancement on image 35 / 19. less distinct on other postcontrast images. Finally, within the Right Hepatic Lobe, there are observations of 1.0 and 1.4 cm on early postcontrast images 38 / 17 and 44 / 17 which are relatively occult on other pulse sequences favoring hemangiomas. GALLBLADDER AND BILIARY SYSTEM: Gallbladder is unremarkable. No intrahepatic or extrahepatic ductal dilation. SPLEEN: Unremarkable. PANCREAS/PANCREATIC DUCT: Visualized pancreas is unremarkable. No pancreatic ductal dilatation. ADRENAL GLANDS: Unremarkable. KIDNEYS: Unremarkable. LYMPH NODES: Mild ileocolic mesenteric adenopathy at 9 mm on image 44 / 23. VASCULATURE: Unremarkable. PERITONEUM: No ascites. ABDOMINAL WALL: No hernia. No mass. BOWEL: The cecal mass on CT is again identified including on image 44 / 23. BONES: No acute abnormality or worrisome osseous lesion. SOFT TISSUES:  MISCELLANEOUS: Unremarkable. IMPRESSION: 1. Multiple bilateral liver lesions. 2 left hepatic lobe lesions are highly suspicious for metastasis. 1 right liver lobe lesion is indeterminate but favored to represent a hemangioma. 2 smaller right liver lobe lesions are favored to be benign, likely hemangiomas. Consider sampling of the segment 2 lesion versus further evaluation with PET. 2.  Cecal mass with adjacent adenopathy, suspicious for nodal metastasis. Electronically signed by: Rockey Kilts MD 04/07/2024 10:10 AM EDT RP Workstation: [id number]   CT Angio Chest Pulmonary Embolism (PE) W or WO Contrast Result Date: 04/06/2024 CLINICAL DATA:  Left-sided chest pain. EXAM: CT ANGIOGRAPHY CHEST WITH CONTRAST TECHNIQUE: Multidetector CT imaging of the chest was performed using the standard protocol during bolus administration of intravenous contrast. Multiplanar CT image reconstructions and MIPs were obtained to evaluate the vascular anatomy. RADIATION DOSE REDUCTION: This exam was performed according to the departmental dose-optimization program which includes automated exposure control, adjustment of the mA and/or kV according to patient size and/or use of iterative reconstruction technique. CONTRAST:  75mL OMNIPAQUE  IOHEXOL  350 MG/ML SOLN COMPARISON:  None Available. FINDINGS: Cardiovascular: Satisfactory opacification of the pulmonary arteries to the segmental level. No evidence of pulmonary embolism. Normal heart size. No pericardial effusion. Mediastinum/Nodes: No enlarged mediastinal, hilar, or axillary lymph nodes. Thyroid  gland, trachea, and esophagus demonstrate no significant findings. Lungs/Pleura: Lungs are clear. No pleural effusion or pneumothorax. Upper Abdomen: No acute abnormality. Musculoskeletal: No chest wall abnormality. No acute or significant osseous findings. Review of the MIP images confirms the above findings. IMPRESSION: No evidence of pulmonary embolism or other acute intrathoracic process.  Electronically Signed   By: Greig Maple HERO.D.  On: 04/06/2024 19:11   CT ABDOMEN PELVIS W CONTRAST Result Date: 04/06/2024 CLINICAL DATA:  Acute aortic syndrome (AAS) suspected, left-sided, sharp, nonradiating chest pain. EXAM: CT ABDOMEN AND PELVIS WITH CONTRAST TECHNIQUE: Multidetector CT imaging of the abdomen and pelvis was performed using the standard protocol following bolus administration of intravenous contrast. RADIATION DOSE REDUCTION: This exam was performed according to the departmental dose-optimization program which includes automated exposure control, adjustment of the mA and/or kV according to patient size and/or use of iterative reconstruction technique. CONTRAST:  100mL OMNIPAQUE  IOHEXOL  300 MG/ML  SOLN COMPARISON:  None available. FINDINGS: Lower chest: No focal airspace consolidation or pleural effusion.3 mm nodule, partially visualized, in the subpleural right lower lobe (axial 1). Hepatobiliary: 1.7 cm lesion in the lateral right hepatic lobe (axial 23). Posterior left hepatic lobe lesion measuring 1.8 cm (axial 14).6 mm hypodensity in the central aspect of segment 4 (axial 22).No radiopaque stones or wall thickening of the gallbladder. No intrahepatic or extrahepatic biliary ductal dilation. The portal veins are patent. Pancreas: No mass or main ductal dilation. No peripancreatic inflammation or fluid collection. Spleen: Normal size. No mass. Adrenals/Urinary Tract: No adrenal masses. No renal mass. Mild right-sided hydroureteronephrosis. No nephrolithiasis in either kidney. The urinary bladder is distended without focal abnormality. Stomach/Bowel: The stomach is decompressed without focal abnormality. No small bowel wall thickening or inflammation. No small bowel obstruction.Asymmetric wall thickening enhancement in the cecum extending into the appendiceal orifice (axial 50, sagittal 61), worrisome for underlying colonic neoplasm.The appendix is fluid-filled and dilated measuring up to  1.9 cm with periappendiceal inflammation. Vascular/Lymphatic: No aortic aneurysm. A couple of prominent, right ileocolic chain lymph nodes are present measuring up to 7 mm (axial 50-52). Reproductive: The uterus and ovaries are within normal limits for patient's age.No free pelvic fluid. Other: No pneumoperitoneum, ascites, or mesenteric inflammation. Musculoskeletal: No acute fracture or destructive lesion. Multilevel thoracic osteophytosis. IMPRESSION: 1. Asymmetric wall thickening and enhancement in the cecum, measuring 4.3 cm in craniocaudal dimension, and involving the appendiceal orifice (axial 50, sagittal 61), worrisome for underlying colonic neoplasm. Likely causing obstruction of the appendix, which is fluid-filled and dilated appendix, suggesting acute appendicitis. Surgical consultation recommended. 2. A couple of mildly prominent right ileocolic chain lymph nodes are present measuring up to 7 mm (axial 50-52). These could be reactive or metastatic. 3. There are 3 hypodense lesions in both lobes of the liver, measuring up to 1.8 cm, likely metastatic disease. Critical Value/emergent results were called by telephone at the time of interpretation on 04/06/2024 at 3:00 pm to provider LAMAR PRICE , who verbally acknowledged these results. Electronically Signed   By: Rogelia Myers M.D.   On: 04/06/2024 15:11   DG Chest Port 1 View Result Date: 04/06/2024 EXAM: 1 VIEW(S) XRAY OF THE CHEST 04/06/2024 11:36:23 AM COMPARISON: None available. CLINICAL HISTORY: chest pain. Chest pain FINDINGS: LUNGS AND PLEURA: No focal pulmonary opacity. No pulmonary edema. No pleural effusion. No pneumothorax. HEART AND MEDIASTINUM: No acute abnormality of the cardiac and mediastinal silhouettes. BONES AND SOFT TISSUES: No acute osseous abnormality. IMPRESSION: 1. Normal chest radiograph. Electronically signed by: Lonni Necessary MD 04/06/2024 12:35 PM EDT RP Workstation: [id number]

## 2024-04-15 NOTE — Assessment & Plan Note (Addendum)
 Primary cecal adenocarcinoma, ileocolic mesenteric adenopathy and liver lesions. Baseline CEA was elevated at 32.8. pMMR  Recommend liver lesion biopsy to confirm staging. Recommend molecular profiling- NGS testing. Recommend genetic testing If liver metastasis are resectable, she may be able to have synchronous or staged resection at all sites.  She will also need systemic chemotherapy neoadjuvantly or adjuvantly. Patient would like to have a second opinion at [facility]. Refer patient to [facility] oncology.

## 2024-04-18 ENCOUNTER — Telehealth: Payer: Self-pay

## 2024-04-18 NOTE — Telephone Encounter (Signed)
 Spoke to patient and informed her of MD recommendation for oral iron supplement. Pt verbalized understanding.

## 2024-04-18 NOTE — Telephone Encounter (Signed)
-----   Message from Zelphia Cap sent at 04/15/2024  9:18 PM EDT ----- Please let patient know that her blood work done at the hospital also indicate iron deficient anemia.  This is likely due to chronic blood loss from her colon cancer. I recommend over-the-counter iron supplementation.  Vitron C or Slow Fe, 1 tablet daily.  If she is unable to tolerate, we can offer her IV Venofer treatments.  Thank you

## 2024-04-19 ENCOUNTER — Ambulatory Visit
Admission: RE | Admit: 2024-04-19 | Discharge: 2024-04-19 | Disposition: A | Discharge status: Home / Self Care | Source: Ambulatory Visit | Attending: Internal Medicine | Admitting: Internal Medicine

## 2024-04-19 DIAGNOSIS — Z1231 Encounter for screening mammogram for malignant neoplasm of breast: Secondary | ICD-10-CM | POA: Diagnosis present

## 2024-05-05 ENCOUNTER — Other Ambulatory Visit: Payer: Self-pay | Admitting: Family

## 2024-05-05 DIAGNOSIS — Z30011 Encounter for initial prescription of contraceptive pills: Secondary | ICD-10-CM

## 2024-05-09 ENCOUNTER — Telehealth: Payer: Self-pay | Admitting: Oncology

## 2024-05-09 NOTE — Telephone Encounter (Signed)
 Patient called to request treatment appointments. She states she had been referred to Dr. Milus? But they referred her back here for treatment to be closer to home. Please advise. She can be reached at [phone] Thank you

## 2024-05-19 ENCOUNTER — Telehealth: Payer: Self-pay

## 2024-05-19 ENCOUNTER — Inpatient Hospital Stay: Attending: Oncology | Admitting: Oncology

## 2024-05-19 ENCOUNTER — Encounter: Payer: Self-pay | Admitting: Oncology

## 2024-05-19 VITALS — BP 142/96 | HR 119 | Temp 97.3°F | Resp 18 | Wt 228.1 lb

## 2024-05-19 DIAGNOSIS — C180 Malignant neoplasm of cecum: Secondary | ICD-10-CM | POA: Insufficient documentation

## 2024-05-19 DIAGNOSIS — Z79630 Long term (current) use of alkylating agent: Secondary | ICD-10-CM | POA: Insufficient documentation

## 2024-05-19 DIAGNOSIS — Z5111 Encounter for antineoplastic chemotherapy: Secondary | ICD-10-CM | POA: Insufficient documentation

## 2024-05-19 DIAGNOSIS — Z79631 Long term (current) use of antimetabolite agent: Secondary | ICD-10-CM | POA: Diagnosis not present

## 2024-05-19 DIAGNOSIS — C787 Secondary malignant neoplasm of liver and intrahepatic bile duct: Secondary | ICD-10-CM | POA: Insufficient documentation

## 2024-05-19 DIAGNOSIS — D509 Iron deficiency anemia, unspecified: Secondary | ICD-10-CM | POA: Diagnosis not present

## 2024-05-19 MED ORDER — LIDOCAINE-PRILOCAINE 2.5-2.5 % EX CREA: TOPICAL_CREAM | CUTANEOUS | 3 refills | Status: AC

## 2024-05-19 MED ORDER — ONDANSETRON HCL 8 MG PO TABS: 8.0000 mg | ORAL_TABLET | Freq: Three times a day (TID) | ORAL | 1 refills | Status: AC | PRN

## 2024-05-19 MED ORDER — PROCHLORPERAZINE MALEATE 10 MG PO TABS: 10.0000 mg | ORAL_TABLET | Freq: Four times a day (QID) | ORAL | 1 refills | Status: AC | PRN

## 2024-05-19 MED ORDER — DEXAMETHASONE 4 MG PO TABS: 8.0000 mg | ORAL_TABLET | Freq: Every day | ORAL | 1 refills | Status: AC

## 2024-05-19 NOTE — Assessment & Plan Note (Signed)
 Recommend liver lesion biopsy. She declined.

## 2024-05-19 NOTE — Progress Notes (Signed)
START ON PATHWAY REGIMEN - Colorectal     A cycle is every 14 days:     Oxaliplatin      Leucovorin      Fluorouracil      Fluorouracil   **Always confirm dose/schedule in your pharmacy ordering system**  Patient Characteristics: Distant Metastases, Resectable, Neoadjuvant Therapy Planned Tumor Location: Colon Therapeutic Status: Distant Metastases  Intent of Therapy: Non-Curative / Palliative Intent, Discussed with Patient

## 2024-05-19 NOTE — Telephone Encounter (Signed)
 [facility] NGS testing (xR+xT) requested on liver specimen [phone] collected on 04/08/24.   Financial assistance application completed and approved. Patient's out-of-pocket cost will be $0

## 2024-05-19 NOTE — Assessment & Plan Note (Signed)
 Primary cecal adenocarcinoma, ileocolic mesenteric adenopathy and liver lesions. Baseline CEA was elevated at 32.8. pMMR  2nd opinion recommendation at [facility] was reviewed.  Recommend liver lesion biopsy patient declined.  Recommend molecular profiling- NGS testing. Recommend genetic testing-patient reports that she has a genetic appointment at [facility] in Feb 2026  Recommend neoadjuvant chemotherapy with FOLFOX Q2 weeks.  I explained to the patient the risks and benefits of chemotherapy including all but not limited to infusion reaction, hair loss, hearing loss, mouth sore, nausea, vomiting, low blood counts, bleeding, neuropathy, cardiovascular events, kidney failure and risk of life threatening infection and even death Patient voices understanding and willing to proceed chemotherapy.   # Chemotherapy education; Medi port placement.  Antiemetics-Zofran  and Compazine; EMLA cream sent to pharmacy Supportive care measures are necessary for patient well-being and will be provided as necessary. We spent sufficient time to discuss many aspect of care, questions were answered to patient's satisfaction.

## 2024-05-19 NOTE — Progress Notes (Signed)
 Hematology/Oncology Progress note [phone] [phone]      REFERRING PROVIDER: Fernand Fredy RAMAN, MD    CHIEF COMPLAINTS/PURPOSE OF CONSULTATION:  Cecal adenocarcinoma liver lesions   ASSESSMENT & PLAN:   Iron deficiency anemia Lab Results  Component Value Date   HGB 10.3 (L) 04/07/2024   TIBC 377 04/08/2024   IRONPCTSAT 5 (L) 04/08/2024   FERRITIN 63 04/08/2024    Recommend oral iron supplementation.   Metastasis to liver [facility]) Recommend liver lesion biopsy. She declined.   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 Baseline CEA was elevated at 32.8. pMMR  2nd opinion recommendation at [facility] was reviewed.  Recommend liver lesion biopsy patient declined.  Recommend molecular profiling- NGS testing. Recommend genetic testing-patient reports that she has a genetic appointment at [facility] in Feb 2026  Recommend neoadjuvant chemotherapy with FOLFOX Q2 weeks.  I explained to the patient the risks and benefits of chemotherapy including all but not limited to infusion reaction, hair loss, hearing loss, mouth sore, nausea, vomiting, low blood counts, bleeding, neuropathy, cardiovascular events, kidney failure and risk of life threatening infection and even death Patient voices understanding and willing to proceed chemotherapy.   # Chemotherapy education; Medi port placement.  Antiemetics-Zofran  and Compazine; EMLA cream sent to pharmacy Supportive care measures are necessary for patient well-being and will be provided as necessary. We spent sufficient time to discuss many aspect of care, questions were answered to patient's satisfaction.    Orders Placed This Encounter  Procedures   Consent Attestation for Oncology Treatment    The patient is informed of risks, benefits, side-effects of the prescribed oncology treatment. Potential short term and long term side effects and response rates  discussed. After a long discussion, the patient made informed decision to proceed.:   Yes   IR IMAGING GUIDED PORT INSERTION    Standing Status:   Future    Expiration Date:   05/19/2025    Reason for Exam (SYMPTOM  OR DIAGNOSIS REQUIRED):   port placement for chemo    Is the patient pregnant?:   No    Preferred Imaging Location?:   [facility]   Pregnancy, urine    Standing Status:   Standing    Number of Occurrences:   20    Expiration Date:   05/19/2025   Follow-up in 2 weeks to start treatment  All questions were answered. The patient knows to call the clinic with any problems, questions or concerns.  Zelphia Cap, MD, PhD [facility] Hematology Oncology 05/19/2024    HISTORY OF PRESENTING ILLNESS:  Amber Price 43 y.o. female presents to establish care for stage IV cecal adenocarcinoma I have reviewed her chart and materials related to her cancer extensively and collaborated history with the patient. Summary of oncologic history is as follows: Oncology History  Primary cancer of cecum (HCC)  04/06/2024 Imaging   CT abdomen pelvis with contrast showed 1. Asymmetric wall thickening and enhancement in the cecum, measuring 4.3 cm in craniocaudal dimension, and involving the appendiceal orifice (axial 50, sagittal 61), worrisome for underlying colonic neoplasm. Likely causing obstruction of the appendix, which is fluid-filled and dilated appendix, suggesting acute appendicitis. Surgical consultation recommended. 2. A couple of mildly prominent right ileocolic chain lymph nodes are present measuring up to 7 mm (axial 50-52). These could be reactive or metastatic. 3. There are 3 hypodense lesions in both lobes of the liver, measuring up to 1.8 cm, likely metastatic disease.  04/06/2024 Imaging   CT chest angiogram No evidence of pulm embolism or other acute intrathoracic process.   04/07/2024 Imaging   MRI of liver with and without contrast showed 1. Multiple bilateral  liver lesions. 2 left hepatic lobe lesions are highly suspicious for metastasis. 1 right liver lobe lesion is indeterminate but favored to represent a hemangioma. 2 smaller right liver lobe lesions are favored to be benign, likely hemangiomas. Consider sampling of the segment 2 lesion versus further evaluation with PET. 2.  Cecal mass with adjacent adenopathy, suspicious for nodal metastasis.  04/08/2024, colonoscopy showed fungating partially obstructing large mass in the cecum. Biopsy pathology showed invasive moderately differentiated adenocarcinoma. pMMR    04/15/2024 Initial Diagnosis   Primary cancer of cecum   Patient presented to emergency room for evaluation of UTI symptoms.  She was found to have sepsis secondary to E. coli bacteremia, acute UTI.  Urine culture grew pansensitive E. coli.  Patient was treated with IV antibiotics and discharged on oral Levaquin .  CT obtained during hospitalization showed Asymmetric wall thickening and enhancement in the cecum , measuring 4.3 cm worrisome for underlying colonic neoplasm.  There are 3 hypodense liver lesions in both lobes of the liver.  Mildly prominent right ileocolic chain lymph nodes. MRI liver showed multiple bilateral liver lesions.  2 left hepatic lobe lesions are highly suspicious for metastasis.  1 right liver lobe lesion is indeterminate but favored to represent a hemangioma.  2 smaller right liver lobe lesions are favored to be benign.  No new metastasis.   04/15/2024 Cancer Staging   Staging form: Colon and Rectum, AJCC 8th Edition - Clinical stage from 04/15/2024: Stage Unknown (cTX, cN1, cM1) - Signed by Babara Call, MD on 05/19/2024 Stage prefix: Initial diagnosis Total positive nodes: 1   05/20/2024 -  Chemotherapy   Patient is on Treatment Plan : COLORECTAL FOLFOX q14d x 3 months      Patient denies family history of colon cancer.  Paternal grandmother had lung cancer. She denies any rectal bleeding, melena or change of bowel  habits.  Patient is on methotrexate for rheumatoid arthritis. Denies unintentional weight loss, fever, night sweats.  Patient presents to discuss management plan.  Accompanied by her colleague.  She was seen by [facility] Dr. Jia. PET scan was done. She was recommended for liver lesion biopsy and she declined.  She reports having stopped all her medication. No longer on Methotrexate.  She reports occasional abdominal pain. Otherwise she feels well.    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 RECK 2018 ANNUAL   Sickle cell trait    Vitamin D  deficiency 01/2016   ADD VIT D3 5000 IU DAILY    SURGICAL HISTORY: Past Surgical History:  Procedure Laterality Date   COLONOSCOPY N/A 04/08/2024   Procedure: COLONOSCOPY;  Surgeon: Jinny Carmine, MD;  Location: [facility] ENDOSCOPY;  Service: Endoscopy;  Laterality: N/A;   NO PAST SURGERIES      SOCIAL HISTORY: Social History   Socioeconomic History   Marital status: Single    Spouse name: Not on file   Number of children: 3  Years of education: 14   Highest education level: Not on file  Occupational History   Occupation: BUSINESS OFFICE SPECIALIST  Tobacco Use   Smoking status: Never   Smokeless tobacco: Never  Vaping Use   Vaping status: Never Used  Substance and Sexual Activity   Alcohol use: Not Currently   Drug use: No   Sexual activity: Yes    Birth control/protection: Pill  Other Topics Concern   Not on file  Social History Narrative   Not on file   Social Drivers of Health   Financial Resource Strain: Low Risk  (04/19/2024)   Received [facility]   Overall Financial Resource Strain (CARDIA)    How hard is it for you to pay for the very  basics like food, housing, medical care, and heating?: Not very hard  Recent Concern: Financial Resource Strain - Medium Risk (04/06/2024)   Received from [facility]   Overall Financial Resource Strain (CARDIA)    Difficulty of Paying Living Expenses: Somewhat hard  Food Insecurity: No Food Insecurity (04/19/2024)   [facility] from [facility]   Hunger Vital Sign    Within the past 12 months, you worried that your food would run out before you got the money to buy more.: Never true    Within the past 12 months, the food you bought just didn't last and you didn't have money to get more.: Never true  Recent Concern: Food Insecurity - Food Insecurity Present (04/12/2024)   Hunger Vital Sign    Worried About Running Out of Food in the Last Year: Sometimes true    Ran Out of Food in the Last Year: Never true  Transportation Needs: No Transportation Needs (04/19/2024)   Received from [facility]    Lack of Transportation (Medical): No    Lack of Transportation (Non-Medical): No  Physical Activity: Not on file  Stress: Not on file  Social Connections: Not on file  Intimate Partner Violence: Not At Risk (04/15/2024)   Humiliation, Afraid, Rape, and Kick questionnaire    Fear of Current or Ex-Partner: No    Emotionally Abused: No    Physically Abused: No    Sexually Abused: No    FAMILY HISTORY: Family History  Problem Relation Age of Onset   Diabetes Mellitus II Mother    Hypertension Mother    Hypercholesterolemia Father    Cancer Paternal Grandmother 50       LUNG   Sickle cell trait Son     ALLERGIES:  has no known allergies.  MEDICATIONS:  Current Outpatient Medications  Medication Sig Dispense Refill   amLODipine  (NORVASC ) 5 MG tablet Take 1 tablet (5 mg total) by mouth daily. 30 tablet 11   Cholecalciferol (VITAMIN D3) 1.25 MG (50000 UT) CAPS TAKE 1 CAPSULE BY MOUTH ONE TIME PER WEEK 12 capsule 3   folic acid (FOLVITE) 1 MG  tablet Take 1 mg by mouth daily.     JUNEL  FE 1/20 1-20 MG-MCG tablet TAKE 1 TABLET BY MOUTH EVERY DAY 84 tablet 1   rosuvastatin  (CRESTOR ) 20 MG tablet TAKE 1 TABLET BY MOUTH EVERY DAY 90 tablet 3   valACYclovir  (VALTREX ) 500 MG tablet Take 1 tablet (500 mg total) by mouth daily. 90 tablet 3   dexamethasone (DECADRON) 4 MG tablet Take 2 tablets (8 mg total) by mouth daily. Start the day after chemotherapy for 2 days. Take with food. 30 tablet 1  lidocaine -prilocaine (EMLA) cream Apply to affected area once 30 g 3   ondansetron  (ZOFRAN ) 8 MG tablet Take 1 tablet (8 mg total) by mouth every 8 (eight) hours as needed for nausea or vomiting. Start on the third day after chemotherapy. 30 tablet 1   prochlorperazine (COMPAZINE) 10 MG tablet Take 1 tablet (10 mg total) by mouth every 6 (six) hours as needed for nausea or vomiting. 30 tablet 1   No current facility-administered medications for this visit.    Review of Systems  Constitutional:  Negative for appetite change, chills, fatigue and fever.  HENT:   Negative for hearing loss and voice change.   Eyes:  Negative for eye problems.  Respiratory:  Negative for chest tightness and cough.   Cardiovascular:  Negative for chest pain.  Gastrointestinal:  Negative for abdominal distention, abdominal pain and blood in stool.  Endocrine: Negative for hot flashes.  Genitourinary:  Negative for difficulty urinating and frequency.   Musculoskeletal:  Negative for arthralgias.  Skin:  Negative for itching and rash.  Neurological:  Negative for extremity weakness.  Hematological:  Negative for adenopathy.  Psychiatric/Behavioral:  Negative for confusion.      PHYSICAL EXAMINATION: ECOG PERFORMANCE STATUS: 0 - Asymptomatic  Vitals:   05/19/24 1116 05/19/24 1120  BP: (!) 150/66 (!) 142/96  Pulse: (!) 119 (!) 119  Resp: 18   Temp: (!) 97.3 F (36.3 C)    Filed Weights   05/19/24 1116  Weight: 228 lb 1.6 oz (103.5 kg)    Physical  Exam Constitutional:      General: She is not in acute distress.    Appearance: She is not diaphoretic.  HENT:     Head: Normocephalic and atraumatic.  Eyes:     General: No scleral icterus. Cardiovascular:     Rate and Rhythm: Normal rate.  Pulmonary:     Effort: Pulmonary effort is normal. No respiratory distress.  Abdominal:     General: Bowel sounds are normal. There is no distension.     Palpations: Abdomen is soft.  Musculoskeletal:        General: Normal range of motion.     Cervical back: Normal range of motion and neck supple.  Skin:    Findings: No erythema.  Neurological:     Mental Status: She is alert and oriented to person, place, and time. Mental status is at baseline.     Cranial Nerves: No cranial nerve deficit.     Motor: No abnormal muscle tone.  Psychiatric:        Mood and Affect: Mood and affect normal.      LABORATORY DATA:  I have reviewed the data as listed    Latest Ref Rng & Units 04/07/2024    6:24 AM 04/06/2024    3:41 PM 04/06/2024   11:24 AM  CBC  WBC 4.0 - 10.5 K/uL 10.3  14.9  14.4   Hemoglobin 12.0 - 15.0 g/dL 89.6  88.1  88.6   Hematocrit 36.0 - 46.0 % 31.5  35.7  35.3   Platelets 150 - 400 K/uL 307  316  316       Latest Ref Rng & Units 04/07/2024    6:24 AM 04/06/2024    3:41 PM 04/06/2024   11:24 AM  CMP  Glucose 70 - 99 mg/dL 82  894  896   BUN 6 - 20 mg/dL 9  7  9    Creatinine 0.44 - 1.00 mg/dL 9.07  8.96  8.88  Sodium 135 - 145 mmol/L 140  138  136   Potassium 3.5 - 5.1 mmol/L 3.6  3.8  3.5   Chloride 98 - 111 mmol/L 109  103  103   CO2 22 - 32 mmol/L 23  23  22    Calcium  8.9 - 10.3 mg/dL 8.1  8.6  8.8   Total Protein 6.5 - 8.1 g/dL  7.3  7.3   Total Bilirubin 0.0 - 1.2 mg/dL  1.1  0.9   Alkaline Phos 38 - 126 U/L  54  56   AST 15 - 41 U/L  18  16   ALT 0 - 44 U/L  12  13      RADIOGRAPHIC STUDIES: I have personally reviewed the radiological images as listed and agreed with the findings in the report. No results  found.

## 2024-05-19 NOTE — Assessment & Plan Note (Signed)
 Lab Results  Component Value Date   HGB 10.3 (L) 04/07/2024   TIBC 377 04/08/2024   IRONPCTSAT 5 (L) 04/08/2024   FERRITIN 63 04/08/2024    Recommend oral iron supplementation.

## 2024-05-19 NOTE — Progress Notes (Signed)
 Pt here for follow up. Pt reports occasional abdominal pain

## 2024-05-20 ENCOUNTER — Other Ambulatory Visit: Payer: Self-pay

## 2024-05-20 ENCOUNTER — Telehealth: Payer: Self-pay

## 2024-05-20 NOTE — Progress Notes (Signed)
 Pharmacist Chemotherapy Monitoring - Initial Assessment    Anticipated start date: 05/31/24   The following has been reviewed per standard work regarding the patient's treatment regimen: The patient's diagnosis, treatment plan and drug doses, and organ/hematologic function Lab orders and baseline tests specific to treatment regimen  The treatment plan start date, drug sequencing, and pre-medications Prior authorization status  Patient's documented medication list, including drug-drug interaction screen and prescriptions for anti-emetics and supportive care specific to the treatment regimen The drug concentrations, fluid compatibility, administration routes, and timing of the medications to be used The patient's access for treatment and lifetime cumulative dose history, if applicable  The patient's medication allergies and previous infusion related reactions, if applicable  Primary cecal adenocarcinoma, ileocolic mesenteric adenopathy and liver lesions.  neoadjuvant chemotherapy with FOLFOX Q2 weeks.  Changes made to treatment plan:  N/A  Follow up needed:  Check prior auth  Redell JINNY Gaskins, [facility], 05/20/2024  3:38 PM

## 2024-05-20 NOTE — Telephone Encounter (Signed)
 Patient has accepted appointment for port placement on 11/13 @ 10am arrive 9am

## 2024-05-23 ENCOUNTER — Inpatient Hospital Stay

## 2024-05-23 DIAGNOSIS — C180 Malignant neoplasm of cecum: Secondary | ICD-10-CM

## 2024-05-23 NOTE — Progress Notes (Signed)
 [facility] CSW Progress Note  Clinical Social Work introduced self to patient during Patient Education with [name], Charity fundraiser.  Provided information regarding CSW role, including counseling, advanced care planning and support group.  Answered questions as needed.  Follow Up Plan:  CSW will follow-up with patient by phone     Amber Price Au, LCSW Clinical Social Worker [facility]

## 2024-05-24 ENCOUNTER — Encounter: Payer: Self-pay | Admitting: Oncology

## 2024-05-24 ENCOUNTER — Inpatient Hospital Stay

## 2024-05-24 NOTE — Progress Notes (Signed)
 [facility] Psychosocial Distress Screening Clinical Social Work  Amber Price is a 43 y.o. year old female. Clinical Social Work was referred by nurse navigator for positive distress screening. The patient scored a 8 on the Psychosocial Distress Thermometer which indicates severe distress. Clinical Social Worker contacted patient by phone to assess for distress and other psychosocial needs.     Distress Screen:    05/23/2024    2:54 PM  ONCBCN DISTRESS SCREENING  Screening Type Initial Screening  How much distress have you been experiencing in the past week? (0-10) 8  Practical concerns type Taking care of others;Work;Finances;Treatment decisions  Emotional concerns type Worry or anxiety;Fear     Interventions: Provided brief mental health counseling with regard to adjusting to her illness.   Patient is a Patient Engineer, Building Services at [employer].  She has both short and long term disability insurance through her employer.  Patient has a 5 and 13 at home, along with a 19 year old child outside of the home who has a 82 year old child.  She currently receives food stamps.  CSW provided information regarding the [employer] and the financial eligibility requirement.  She will let CSW know if she is interested in pursuing the grant.  Patient reports her anxiety has decreased since attending chemo education class and speaking with several of her family members.  CSW and patient discussed common feeling and emotions when being diagnosed with cancer, and the importance of support during treatment.  CSW informed patient of the support team and support services at [facility]  CSW provided contact information and encouraged patient to call with any questions or concerns.   Follow Up Plan: CSW will follow-up with patient by phone  Patient verbalizes understanding of plan: Yes    Amber CHRISTELLA Keone Kamer, LCSW

## 2024-05-24 NOTE — Addendum Note (Signed)
 Addended by: BABARA CALL on: 05/24/2024 01:22 PM   Modules accepted: Orders

## 2024-05-25 ENCOUNTER — Other Ambulatory Visit: Payer: Self-pay | Admitting: Radiology

## 2024-05-25 NOTE — Progress Notes (Signed)
 Patient for IR Port Placement on Thurs 05/26/24, I called and spoke with the patient on the phone and gave pre-procedure instructions. Pt was made aware to be here at 9a, NPO after MN prior to procedure as well as driver post procedure/recovery/discharge. Pt stated understanding.  Called 05/25/24

## 2024-05-26 ENCOUNTER — Ambulatory Visit
Admission: RE | Admit: 2024-05-26 | Discharge: 2024-05-26 | Disposition: A | Discharge status: Home / Self Care | Source: Ambulatory Visit | Attending: Oncology | Admitting: Oncology

## 2024-05-26 ENCOUNTER — Encounter: Payer: Self-pay | Admitting: Radiology

## 2024-05-26 DIAGNOSIS — Z79899 Other long term (current) drug therapy: Secondary | ICD-10-CM | POA: Insufficient documentation

## 2024-05-26 DIAGNOSIS — I10 Essential (primary) hypertension: Secondary | ICD-10-CM | POA: Insufficient documentation

## 2024-05-26 DIAGNOSIS — C180 Malignant neoplasm of cecum: Secondary | ICD-10-CM | POA: Insufficient documentation

## 2024-05-26 DIAGNOSIS — M069 Rheumatoid arthritis, unspecified: Secondary | ICD-10-CM | POA: Diagnosis not present

## 2024-05-26 HISTORY — PX: IR IMAGING GUIDED PORT INSERTION: IMG5740

## 2024-05-26 MED ORDER — SODIUM CHLORIDE 0.9 % IV SOLN: INTRAVENOUS | Status: DC

## 2024-05-26 MED ORDER — MIDAZOLAM HCL 2 MG/2ML IJ SOLN
INTRAMUSCULAR | Status: AC
  Filled 2024-05-26: qty 2

## 2024-05-26 MED ORDER — HEPARIN SOD (PORK) LOCK FLUSH 100 UNIT/ML IV SOLN
500.0000 [IU] | Freq: Once | INTRAVENOUS | Status: AC
  Administered 2024-05-26: 500 [IU] via INTRAVENOUS

## 2024-05-26 MED ORDER — ACETAMINOPHEN 325 MG PO TABS
ORAL_TABLET | ORAL | Status: AC
  Filled 2024-05-26: qty 2

## 2024-05-26 MED ORDER — FENTANYL CITRATE (PF) 100 MCG/2ML IJ SOLN
INTRAMUSCULAR | Status: AC | PRN
  Administered 2024-05-26 (×2): 50 ug via INTRAVENOUS

## 2024-05-26 MED ORDER — FENTANYL CITRATE (PF) 100 MCG/2ML IJ SOLN
INTRAMUSCULAR | Status: AC
  Filled 2024-05-26: qty 2

## 2024-05-26 MED ORDER — MIDAZOLAM HCL (PF) 2 MG/2ML IJ SOLN
INTRAMUSCULAR | Status: AC | PRN
Start: 2024-05-26 — End: 2024-05-26
  Administered 2024-05-26 (×2): 1 mg via INTRAVENOUS

## 2024-05-26 MED ORDER — LIDOCAINE HCL 1 % IJ SOLN
14.0000 mL | Freq: Once | INTRAMUSCULAR | Status: AC
  Administered 2024-05-26: 14 mL via INTRADERMAL

## 2024-05-26 MED ORDER — ACETAMINOPHEN 325 MG PO TABS
650.0000 mg | ORAL_TABLET | Freq: Four times a day (QID) | ORAL | Status: DC | PRN
  Administered 2024-05-26: 650 mg via ORAL
  Filled 2024-05-26: qty 2

## 2024-05-26 NOTE — Discharge Instructions (Signed)
 Implanted [facility] Guide  An implanted port is a type of central line that is placed under the skin. Central lines are used to provide IV access when treatment or nutrition needs to be given through a person's veins. Implanted ports are used for long-term IV access. An implanted port may be placed because: You need IV medicine that would be irritating to the small veins in your hands or arms. You need long-term IV medicines, such as antibiotics. You need IV nutrition for a long period. You need frequent blood draws for lab tests. You need dialysis.   Implanted ports are usually placed in the chest area, but they can also be placed in the upper arm, the abdomen, or the leg. An implanted port has two main parts: Reservoir. The reservoir is round and will appear as a small, raised area under your skin. The reservoir is the part where a needle is inserted to give medicines or draw blood. Catheter. The catheter is a thin, flexible tube that extends from the reservoir. The catheter is placed into a large vein. Medicine that is inserted into the reservoir goes into the catheter and then into the vein.   How will I care for my incision  You may shower tomorrow Please remove dressing in 24hrs not other skin care is needed  How is my port accessed? Special steps must be taken to access the port: Before the port is accessed, a numbing cream can be placed on the skin. This helps numb the skin over the port site. Your health care provider uses a sterile technique to access the port. Your health care provider must put on a mask and sterile gloves. The skin over your port is cleaned carefully with an antiseptic and allowed to dry. The port is gently pinched between sterile gloves, and a needle is inserted into the port. Only "non-coring" port needles should be used to access the port. Once the port is accessed, a blood return should be checked. This helps ensure that the port is in the vein and is not  clogged. If your port needs to remain accessed for a constant infusion, a clear (transparent) bandage will be placed over the needle site. The bandage and needle will need to be changed every week, or as directed by your health care provider.   What is flushing? Flushing helps keep the port from getting clogged. Follow your health care provider's instructions on how and when to flush the port. Ports are usually flushed with saline solution or a medicine called heparin. The need for flushing will depend on how the port is used. If the port is used for intermittent medicines or blood draws, the port will need to be flushed: After medicines have been given. After blood has been drawn. As part of routine maintenance. If a constant infusion is running, the port may not need to be flushed.   How long will my port stay implanted? The port can stay in for as long as your health care provider thinks it is needed. When it is time for the port to come out, surgery will be done to remove it. The procedure is similar to the one performed when the port was put in. When should I seek immediate medical care? When you have an implanted port, you should seek immediate medical care if: You notice a bad smell coming from the incision site. You have swelling, redness, or drainage at the incision site. You have more swelling or pain at the  port site or the surrounding area. You have a fever that is not controlled with medicine.   This information is not intended to replace advice given to you by your health care provider. Make sure you discuss any questions you have with your health care provider. Document Released: 06/30/2005 Document Revised: 12/06/2015 Document Reviewed: 03/07/2013 Elsevier Interactive Patient Education  2017 [employer].

## 2024-05-26 NOTE — H&P (Signed)
 Chief Complaint: Patient was seen in consultation today for colon/cecum cancer   Procedure: Port-a-catheter insertion   Referring Physician(s): Yu,Zhou  Supervising Physician: Luverne Aran  Patient Status: [facility] - Out-pt  History of Present Illness: Amber Price is a 43 y.o. female with a history of rheumatoid arthritis, previously on methotrexate, and HTN who initially presented to the ED in late September with concerns for 2-3 months of chest pain. Workup at that time was significant for imaging concerning colonic neoplasm and multiple liver lesions concerning for metastases. While admitted she underwent colonoscopy in which a fungating partially obstructing mass was found in the cecum. 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 IR was consulted during her inpatient stay for possible liver lesion biopsy, but at that time the patient deferred the procedure and requested a second opinion from [facility]. Patient has undergone additional workup with [facility] and plans to continue with care there for any major surgeries/treatment changes as needed. She has plans to undergo chemotherapy and presents to IR today for her port-a-catheter insertion.   Patient is resting in bed with family at the bedside. States that she has been doing well overall. Admits to some weight loss, but states that she has made a lot of dietary changes that are contributing to this as well. She denies any abdominal pain, changes in bowel habits, blood in stool, decreased appetite, fevers/chills, chest pain, or shortness of breath. NPO since midnight. All questions and concerns answered at the bedside.  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 RECK 2018 ANNUAL   Sickle cell trait    Vitamin D  deficiency 01/2016   ADD VIT D3 5000 IU DAILY    Past Surgical History:  Procedure Laterality Date   COLONOSCOPY N/A 04/08/2024   Procedure: COLONOSCOPY;  Surgeon: Jinny Carmine, MD;  Location: [facility] ENDOSCOPY;  Service: Endoscopy;  Laterality: N/A;   NO PAST SURGERIES      Allergies: Patient has no known allergies.  Medications: Prior to Admission medications   Medication Sig Start Date End Date Taking? Authorizing Provider  amLODipine  (NORVASC ) 5 MG tablet Take 1 tablet (5 mg total) by mouth daily. 07/02/23 07/01/24  Fernand Fredy RAMAN, MD  Cholecalciferol (VITAMIN D3) 1.25 MG (50000 UT) CAPS TAKE 1 CAPSULE BY MOUTH ONE TIME PER WEEK 08/27/23   Fernand Fredy RAMAN, MD  dexamethasone (DECADRON) 4 MG tablet Take 2 tablets (8 mg total) by mouth daily. Start the day after chemotherapy for 2 days. Take with food. 05/19/24   Babara Call, MD  folic acid (FOLVITE) 1 MG tablet Take 1 mg by mouth daily. 04/04/24 04/04/25  [provider]  JUNEL  FE 1/20 1-20 MG-MCG tablet TAKE 1 TABLET BY MOUTH EVERY DAY 05/06/24   Fernand Fredy RAMAN, MD  lidocaine -prilocaine (EMLA) cream Apply to affected area once 05/19/24   Babara Call, MD  ondansetron  (ZOFRAN ) 8 MG tablet Take 1 tablet (8 mg total) by mouth every 8 (eight) hours as needed for nausea or vomiting. Start on the third day after chemotherapy. 05/19/24   Babara Call, MD  prochlorperazine (COMPAZINE) 10 MG  tablet Take 1 tablet (10 mg total) by mouth every 6 (six) hours as needed for nausea or vomiting. 05/19/24   Babara Call, MD  rosuvastatin  (CRESTOR ) 20 MG tablet TAKE 1 TABLET BY MOUTH EVERY DAY 11/20/23   Orlean Alan HERO, FNP  valACYclovir  (VALTREX ) 500 MG tablet Take 1 tablet (500 mg total) by mouth daily. 01/11/24   Fernand Fredy RAMAN, MD     Family History  Problem Relation Age of Onset   Diabetes Mellitus II Mother    Hypertension Mother    Hypercholesterolemia Father     Cancer Paternal Grandmother 32       LUNG   Sickle cell trait Son     Social History   Socioeconomic History   Marital status: Single    Spouse name: Not on file   Number of children: 3   Years of education: 14   Highest education level: Not on file  Occupational History   Occupation: BUSINESS OFFICE SPECIALIST  Tobacco Use   Smoking status: Never   Smokeless tobacco: Never  Vaping Use   Vaping status: Never Used  Substance and Sexual Activity   Alcohol use: Not Currently   Drug use: No   Sexual activity: Yes    Birth control/protection: Pill  Other Topics Concern   Not on file  Social History Narrative   Not on file   Social Drivers of Health   Financial Resource Strain: Low Risk (04/19/2024)   Received from [facility]   Overall Financial Resource Strain (CARDIA)    How hard is it for you to pay for the very basics like food, housing, medical care, and heating?: Not very hard  Recent Concern: Financial Resource Strain - Medium Risk (04/06/2024)   Received from [facility]   Overall Financial Resource Strain (CARDIA)    Difficulty of Paying Living Expenses: Somewhat hard  Food Insecurity: No Food Insecurity (04/19/2024)   Received from [facility]   Hunger Vital Sign    Within the past 12 months, you worried that your food would run out before you got the money to buy more.: Never true    Within the past 12 months, the food you bought just didn't last and you didn't have money to get more.: Never true  Recent Concern: Food Insecurity - Food Insecurity Present (04/12/2024)   Hunger Vital Sign    Worried About Running Out of Food in the Last Year: Sometimes true    Ran Out of Food in the Last Year: Never true  Transportation Needs: No Transportation Needs (04/19/2024)   Received from [facility] - Transportation    Lack of Transportation (Medical): No    Lack of Transportation (Non-Medical): No  Physical Activity: Not on file   Stress: Not on file  Social Connections: Not on file    Review of Systems Denies any N/V, chest pain, shortness of breath, fevers/chills. All other ROS negative.  Vital Signs: BP (!) 155/101   Pulse (!) 116   Temp 97.8 F (36.6 C) (Temporal)   Resp 12   Ht 5' 9 (1.753 m)   Wt 228 lb 2.8 oz (103.5 kg)   SpO2 98%   BMI 33.70 kg/m    Physical Exam Vitals reviewed.  Constitutional:      Appearance: Normal appearance.  HENT:     Mouth/Throat:     Mouth: Mucous membranes are moist.     Pharynx: Oropharynx is clear.  Cardiovascular:  Rate and Rhythm: Regular rhythm. Tachycardia present.     Heart sounds: Normal heart sounds.  Pulmonary:     Effort: Pulmonary effort is normal.     Breath sounds: Normal breath sounds.  Abdominal:     General: Abdomen is flat.     Palpations: Abdomen is soft.     Tenderness: There is no abdominal tenderness.  Skin:    General: Skin is warm and dry.  Neurological:     Mental Status: She is alert and oriented to person, place, and time.  Psychiatric:        Behavior: Behavior normal.     Imaging: No results found.  Labs:  CBC: Recent Labs    01/11/24 1018 04/06/24 1124 04/06/24 1541 04/07/24 0624  WBC 6.7 14.4* 14.9* 10.3  HGB 11.2 11.3* 11.8* 10.3*  HCT 35.6 35.3* 35.7* 31.5*  PLT 310 316 316 307    COAGS: Recent Labs    04/06/24 1511  INR 1.0  APTT 30    BMP: Recent Labs    01/11/24 1018 04/06/24 1124 04/06/24 1541 04/07/24 0624  NA 136 136 138 140  K 4.8 3.5 3.8 3.6  CL 103 103 103 109  CO2 20 22 23 23   GLUCOSE 88 103* 105* 82  BUN 8 9 7 9   CALCIUM  8.8 8.8* 8.6* 8.1*  CREATININE 1.00 1.11* 1.03* 0.92  GFRNONAA  --  >60 >60 >60    LIVER FUNCTION TESTS: Recent Labs    10/12/23 1010 01/11/24 1018 04/06/24 1124 04/06/24 1541  BILITOT 0.3 0.3 0.9 1.1  AST 16 14 16 18   ALT 9 9 13 12   ALKPHOS 62 62 56 54  PROT 6.6 6.3 7.3 7.3  ALBUMIN 3.9 3.8* 3.3* 3.1*    TUMOR MARKERS: No results for  input(s): AFPTM, CEA, CA199, CHROMGRNA in the last 8760 hours.  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 She has decided to move forward with chemotherapy and presents to [facility] Interventional Radiology department for an image-guided port-a-catheter insertion with Dr. KANDICE Moan. Procedure to be performed under moderate sedation.  Risks and benefits of image guided port-a-catheter placement was discussed with the patient including, but not limited to bleeding, infection, pneumothorax, or fibrin sheath development and need for additional procedures.  All of the patient's questions were answered, patient is agreeable to proceed. Consent signed and in chart.   Thank you for this interesting consult. I greatly enjoyed meeting Amber Price and look forward to participating in their care. A copy of this report was sent to the requesting provider on this date.  Electronically Signed: Glennon CHRISTELLA Bal, PA-C 05/26/2024, 9:41 AM   I spent a total of  30 Minutes in face to face clinical consultation, greater than 50% of which was counseling/coordinating care for port-a-catheter insertion.

## 2024-05-26 NOTE — Procedures (Signed)
 Interventional Radiology Procedure Note  Procedure: Single Lumen Power Port Placement    Access:  Right IJ vein.  Findings: Catheter tip positioned at SVC/RA junction. Port is ready for immediate use.   Complications: None  EBL: < 10 mL  Recommendations:  - Ok to shower in 24 hours - Do not submerge for 7 days - Routine line care   Maxi Carreras T. Fredia Sorrow, M.D Pager:  [phone]

## 2024-05-26 NOTE — Progress Notes (Signed)
 Patient clinically stable post IR port placement per Dr Luverne, tolerated well. Vitals stable post procedure. Received Versed 2 mg along with Fentanyl 100 mcg IV for procedure. Report given to Donny Cedar RN post procedure/specials/12

## 2024-05-31 ENCOUNTER — Inpatient Hospital Stay

## 2024-05-31 ENCOUNTER — Encounter: Payer: Self-pay | Admitting: Oncology

## 2024-05-31 ENCOUNTER — Inpatient Hospital Stay: Admitting: Oncology

## 2024-05-31 VITALS — BP 155/93 | HR 104 | Temp 96.9°F | Resp 20 | Wt 223.6 lb

## 2024-05-31 DIAGNOSIS — Z5111 Encounter for antineoplastic chemotherapy: Secondary | ICD-10-CM | POA: Diagnosis not present

## 2024-05-31 DIAGNOSIS — D509 Iron deficiency anemia, unspecified: Secondary | ICD-10-CM | POA: Diagnosis not present

## 2024-05-31 DIAGNOSIS — C180 Malignant neoplasm of cecum: Secondary | ICD-10-CM | POA: Diagnosis not present

## 2024-05-31 DIAGNOSIS — R21 Rash and other nonspecific skin eruption: Secondary | ICD-10-CM | POA: Insufficient documentation

## 2024-05-31 DIAGNOSIS — C787 Secondary malignant neoplasm of liver and intrahepatic bile duct: Secondary | ICD-10-CM

## 2024-05-31 LAB — CBC WITH DIFFERENTIAL (CANCER CENTER ONLY)
Abs Immature Granulocytes: 0.01 K/uL (ref 0.00–0.07)
Basophils Absolute: 0 K/uL (ref 0.0–0.1)
Basophils Relative: 1 %
Eosinophils Absolute: 0.2 K/uL (ref 0.0–0.5)
Eosinophils Relative: 3 %
HCT: 31.1 % — ABNORMAL LOW (ref 36.0–46.0)
Hemoglobin: 10.3 g/dL — ABNORMAL LOW (ref 12.0–15.0)
Immature Granulocytes: 0 %
Lymphocytes Relative: 27 %
Lymphs Abs: 1.6 K/uL (ref 0.7–4.0)
MCH: 24.6 pg — ABNORMAL LOW (ref 26.0–34.0)
MCHC: 33.1 g/dL (ref 30.0–36.0)
MCV: 74.4 fL — ABNORMAL LOW (ref 80.0–100.0)
Monocytes Absolute: 0.5 K/uL (ref 0.1–1.0)
Monocytes Relative: 8 %
Neutro Abs: 3.7 K/uL (ref 1.7–7.7)
Neutrophils Relative %: 61 %
Platelet Count: 320 K/uL (ref 150–400)
RBC: 4.18 MIL/uL (ref 3.87–5.11)
RDW: 14.5 % (ref 11.5–15.5)
WBC Count: 6.1 K/uL (ref 4.0–10.5)
nRBC: 0 % (ref 0.0–0.2)

## 2024-05-31 LAB — CMP (CANCER CENTER ONLY)
ALT: 11 U/L (ref 0–44)
AST: 15 U/L (ref 15–41)
Albumin: 3.5 g/dL (ref 3.5–5.0)
Alkaline Phosphatase: 47 U/L (ref 38–126)
Anion gap: 9 (ref 5–15)
BUN: 10 mg/dL (ref 6–20)
CO2: 23 mmol/L (ref 22–32)
Calcium: 8.7 mg/dL — ABNORMAL LOW (ref 8.9–10.3)
Chloride: 106 mmol/L (ref 98–111)
Creatinine: 0.89 mg/dL (ref 0.44–1.00)
GFR, Estimated: >60 mL/min (ref >60)
Glucose, Bld: 96 mg/dL (ref 70–99)
Potassium: 3.6 mmol/L (ref 3.5–5.1)
Sodium: 138 mmol/L (ref 135–145)
Total Bilirubin: 0.7 mg/dL (ref 0.0–1.2)
Total Protein: 7.1 g/dL (ref 6.5–8.1)

## 2024-05-31 LAB — PREGNANCY, URINE: Preg Test, Ur: NEGATIVE

## 2024-05-31 MED ORDER — PALONOSETRON HCL INJECTION 0.25 MG/5ML
0.2500 mg | Freq: Once | INTRAVENOUS | Status: AC
  Administered 2024-05-31: 0.25 mg via INTRAVENOUS
  Filled 2024-05-31: qty 5

## 2024-05-31 MED ORDER — OXALIPLATIN CHEMO INJECTION 100 MG/20ML
85.0000 mg/m2 | Freq: Once | INTRAVENOUS | Status: AC
  Administered 2024-05-31: 200 mg via INTRAVENOUS
  Filled 2024-05-31: qty 40

## 2024-05-31 MED ORDER — LEUCOVORIN CALCIUM INJECTION 350 MG
400.0000 mg/m2 | Freq: Once | INTRAVENOUS | Status: AC
  Administered 2024-05-31: 896 mg via INTRAVENOUS
  Filled 2024-05-31: qty 44.8

## 2024-05-31 MED ORDER — FLUOROURACIL CHEMO INJECTION 2.5 GM/50ML
400.0000 mg/m2 | Freq: Once | INTRAVENOUS | Status: AC
  Administered 2024-05-31: 900 mg via INTRAVENOUS
  Filled 2024-05-31: qty 18

## 2024-05-31 MED ORDER — SODIUM CHLORIDE 0.9 % IV SOLN
2400.0000 mg/m2 | INTRAVENOUS | Status: DC
  Administered 2024-05-31: 5000 mg via INTRAVENOUS
  Filled 2024-05-31: qty 100

## 2024-05-31 MED ORDER — DEXTROSE 5 % IV SOLN
INTRAVENOUS | Status: DC
  Filled 2024-05-31: qty 250

## 2024-05-31 MED ORDER — DEXAMETHASONE SOD PHOSPHATE PF 10 MG/ML IJ SOLN
10.0000 mg | Freq: Once | INTRAMUSCULAR | Status: AC
  Administered 2024-05-31: 10 mg via INTRAVENOUS

## 2024-05-31 NOTE — Telephone Encounter (Signed)
 Per Shasta Ards with [employer]: Another provider already ordered testing on case [id number]. We have closed out Dr. Layvonne order as a duplicate, and I have attached the results here. We are running the normal sample now and will amend the report with those potential germline findings. I will send those results when they are ready.

## 2024-05-31 NOTE — Assessment & Plan Note (Signed)
 Previously she declined liver lesion biopsy. She will repeat imaging at [facility] in the future for evaluation of treatment response.

## 2024-05-31 NOTE — Assessment & Plan Note (Signed)
 Primary cecal adenocarcinoma, ileocolic mesenteric adenopathy and liver lesions. Baseline CEA was elevated at 32.8. pMMR  2nd opinion recommendation at [facility] was reviewed.  Recommend liver lesion biopsy patient declined.  Recommend molecular profiling- NGS testing. Recommend genetic testing-patient reports that she has a genetic appointment at [facility] in Feb 2026  Labs are reviewed and discussed with patient. Proceed with cycle 1 FOLFOX with day 3 pump DC Discussed about antiemetics instruction.

## 2024-05-31 NOTE — Assessment & Plan Note (Signed)
 Lab Results  Component Value Date   HGB 10.3 (L) 05/31/2024   TIBC 377 04/08/2024   IRONPCTSAT 5 (L) 04/08/2024   FERRITIN 63 04/08/2024    Hb remains stable, did not improved with oral iron supplementation.  I discussed option of IV Venofer treatments. I discussed about the potential risks including but not limited to allergic reactions/infusion reactions including anaphylactic reactions, diarrhea, phlebitis, high blood pressure, wheezing, SOB, skin rash, weight gain,dark urine, leg swelling, back pain, headache, nausea and fatigue, etc. Patient declined IV venofer and prefers to continue oral iron. She is open to try IV venofer if anemia gets worse.

## 2024-05-31 NOTE — Patient Instructions (Signed)

## 2024-05-31 NOTE — Patient Instructions (Signed)

## 2024-05-31 NOTE — Progress Notes (Signed)
 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 Baseline CEA was elevated at 32.8. pMMR  2nd opinion recommendation at [facility] was reviewed.  Recommend liver lesion biopsy patient declined.  Recommend molecular profiling- NGS testing. Recommend genetic testing-patient reports that she has a genetic appointment at [facility] in Feb 2026  Labs are reviewed and discussed with patient. Proceed with cycle 1 FOLFOX with day 3 pump DC Discussed about antiemetics instruction.   Iron deficiency anemia Lab Results  Component Value Date   HGB 10.3 (L) 05/31/2024   TIBC 377 04/08/2024   IRONPCTSAT 5 (L) 04/08/2024   FERRITIN 63 04/08/2024    Hb remains stable, did not improved with oral iron supplementation.  I discussed option of IV Venofer treatments. I discussed about the potential risks including but not limited to allergic reactions/infusion reactions including anaphylactic reactions, diarrhea, phlebitis, high blood pressure, wheezing, SOB, skin rash, weight gain,dark urine, leg swelling, back pain, headache, nausea and fatigue, etc. Patient declined IV venofer and prefers to continue oral iron. She is open to try IV venofer if anemia gets worse.   Metastasis to liver [facility]) Previously she declined liver lesion biopsy. She will repeat imaging at [facility] in the future for evaluation of treatment response.   Skin rash Rash developed prior to chemotherapy. Possibly allergy due to medications used during procedure.  Recommend Benadryl  PRN and also she will take Dexamethasone  8mg  daily x 2 days   Encounter for antineoplastic chemotherapy Chemotherapy treatment as listed above   Orders Placed This Encounter  Procedures   CBC with Differential (Cancer Center Only)    Standing Status:   Future    Expected Date:   06/28/2024    Expiration Date:   06/28/2025   CMP (Cancer Center only)    Standing Status:   Future    Expected Date:   06/28/2024    Expiration Date:   06/28/2025   CBC with Differential (Cancer Center Only)    Standing Status:   Future    Expected Date:   07/13/2024    Expiration Date:   07/13/2025   CMP (Cancer Center only)    Standing Status:   Future    Expected Date:   07/13/2024    Expiration Date:   07/13/2025   CBC with Differential (Cancer Center Only)    Standing Status:   Future    Expected Date:   06/07/2024    Expiration Date:   09/05/2024   CMP (Cancer Center only)    Standing Status:   Future    Expected Date:   06/07/2024    Expiration Date:   09/05/2024   Follow-up per LOS All questions were answered. The patient knows to call the clinic with any problems, questions or concerns.  Call Babara, MD, PhD [facility] Hematology Oncology 05/31/2024    HISTORY OF PRESENTING ILLNESS:  Amber Price 43 y.o. female presents to establish care for stage IV Amber Price I have reviewed her chart and materials related to her cancer extensively and collaborated history with the patient. Summary of oncologic history is as follows: Oncology History  Primary cancer of cecum (HCC)  04/06/2024 Imaging   CT abdomen pelvis with contrast showed 1. Asymmetric wall thickening and enhancement in the cecum, measuring 4.3 cm in craniocaudal dimension, and involving the appendiceal orifice (axial 50, sagittal 61), worrisome for underlying colonic neoplasm. Likely causing obstruction of the appendix, which is fluid-filled and dilated appendix, suggesting acute appendicitis. Surgical consultation recommended. 2. A couple of mildly  prominent right ileocolic chain lymph nodes are present measuring up to 7 mm (axial 50-52). These could be reactive or metastatic. 3. There are 3 hypodense lesions in both lobes of the liver, measuring up to 1.8 cm, likely metastatic disease.     04/06/2024 Imaging   CT chest angiogram No evidence of pulm embolism or other acute intrathoracic process.   04/07/2024 Imaging   MRI of liver with and without contrast showed 1. Multiple bilateral liver lesions. 2 left hepatic lobe lesions are highly suspicious for metastasis. 1 right liver lobe lesion is indeterminate but favored to represent a hemangioma. 2 smaller right liver lobe lesions are favored to be benign, likely hemangiomas. Consider sampling of the segment 2 lesion versus further evaluation with PET. 2.  Amber mass with adjacent adenopathy, suspicious for nodal metastasis.  04/08/2024, colonoscopy showed fungating partially obstructing large mass in the cecum. Biopsy pathology showed invasive moderately differentiated Price. pMMR    04/15/2024 Initial Diagnosis   Primary cancer of cecum   Patient presented to emergency room for evaluation of UTI symptoms.  She was found to have sepsis secondary to E. coli bacteremia, acute UTI.  Urine culture grew pansensitive E. coli.  Patient was treated with IV antibiotics and discharged on oral Levaquin .  CT obtained during hospitalization showed Asymmetric wall thickening and enhancement in the cecum , measuring 4.3 cm worrisome for underlying colonic neoplasm.  There are 3 hypodense liver lesions in both lobes of the liver.  Mildly prominent right ileocolic chain lymph nodes. MRI liver showed multiple bilateral liver lesions.    04/08/2024 colonoscopy showed Likely Amber primary with hepatic metastases. No other sites of metabolically active disease are seen. The prominent mesenteric nodes are visible on noncontrast CT but are not significantly avid above background.   Pathology showed  invasive moderately differentiated Price.  pMMR Tempus liquid biopsy showed KRAS pG12V    04/15/2024 Cancer Staging   Staging form: Colon and Rectum, AJCC 8th Edition - Clinical stage from 04/15/2024: Stage Unknown (cTX, cN1, cM1) - Signed by Babara Call, MD on 05/19/2024 Stage prefix: Initial diagnosis Total positive nodes: 1   04/28/2024 Imaging   PET scan showed Liver: Lesions described in the liver in segments 2 and 4B (SUV max 14.1, CT 129 and 7.3, CT 140). The other lesion in the anterior right hepatic lobe is not metabolically active. The lesions described as likely hemangiomas do not show uptake above background.   Likely Amber primary with hepatic metastases. No other sites of metabolically active disease are seen. The prominent mesenteric nodes are visible on noncontrast CT but are not significantly avid above background.      05/31/2024 -  Chemotherapy   Patient is on Treatment Plan : COLORECTAL FOLFOX q14d x 3 months      Patient denies family history of colon cancer.  Paternal grandmother had lung cancer. She denies any rectal bleeding, melena or change of bowel habits.  Patient is on methotrexate for rheumatoid arthritis. Denies unintentional weight loss, fever, night sweats.  Patient presents to discuss management plan.  Accompanied by her colleague.  She was seen by [facility] Dr. Jia. PET scan was done. She was recommended for liver lesion biopsy and she declined.  She reports having stopped all her medication. No longer on Methotrexate.    INTERVAL HISTORY DEYNA CARBON is a 43 y.o. 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   S/p medi port placement. After procedure, she developed itchy rash after procedure, located on her neck,  around medi port, back of shoulder.  She has no other new complaints. Accompanied by her mother.   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 RECK 2018 ANNUAL   Sickle cell trait    Vitamin D  deficiency 01/2016   ADD VIT D3 5000 IU DAILY    SURGICAL HISTORY: Past Surgical History:  Procedure Laterality Date   COLONOSCOPY N/A 04/08/2024   Procedure: COLONOSCOPY;  Surgeon: Jinny Carmine, MD;  Location: [facility] ENDOSCOPY;  Service: Endoscopy;  Laterality: N/A;   IR IMAGING GUIDED PORT INSERTION  05/26/2024   NO PAST SURGERIES      SOCIAL HISTORY: Social History   Socioeconomic History   Marital status: Single    Spouse name: Not on file   Number of children: 3   Years of education: 14   Highest education level: Not on file  Occupational History   Occupation: BUSINESS OFFICE SPECIALIST  Tobacco Use   Smoking status: Never   Smokeless tobacco: Never  Vaping Use   Vaping status: Never Used  Substance and Sexual Activity   Alcohol use: Not Currently   Drug use: No   Sexual activity: Yes    Birth control/protection: Pill  Other Topics Concern   Not on file  Social History Narrative   Not on file   Social Drivers of Health   Financial Resource Strain: Low Risk (04/19/2024)   Received from [facility]   Overall Financial Resource Strain (CARDIA)    How hard is it for you to pay for the very basics like food, housing, medical care, and heating?: Not very hard  Recent Concern: Financial Resource Strain - Medium Risk (04/06/2024)   Received from [facility]   Overall Financial Resource Strain (CARDIA)    Difficulty of Paying Living Expenses: Somewhat hard  Food Insecurity: No Food Insecurity (04/19/2024)   Received from [facility]   Hunger Vital Sign    Within the past 12 months, you worried that your food  would run out before you got the money to buy more.: Never true    Within the past 12 months, the food you bought just didn't last and you didn't have money to get more.: Never true  Recent Concern: Food Insecurity - Food Insecurity Present (04/12/2024)   Hunger Vital Sign    Worried About Running Out of Food in the Last Year: Sometimes true    Ran Out of Food in the Last Year: Never true  Transportation Needs: No Transportation Needs (04/19/2024)   Received from [facility] - Transportation    Lack of  Transportation (Medical): No    Lack of Transportation (Non-Medical): No  Physical Activity: Not on file  Stress: Not on file  Social Connections: Not on file  Intimate Partner Violence: Not At Risk (04/15/2024)   Humiliation, Afraid, Rape, and Kick questionnaire    Fear of Current or Ex-Partner: No    Emotionally Abused: No    Physically Abused: No    Sexually Abused: No    FAMILY HISTORY: Family History  Problem Relation Age of Onset   Diabetes Mellitus II Mother    Hypertension Mother    Hypercholesterolemia Father    Cancer Paternal Grandmother 52       LUNG   Sickle cell trait Son     ALLERGIES:  has no known allergies.  MEDICATIONS:  Current Outpatient Medications  Medication Sig Dispense Refill   amLODipine  (NORVASC ) 5 MG tablet Take 1 tablet (5 mg total) by mouth daily. 30 tablet 11   dexamethasone (DECADRON) 4 MG tablet Take 2 tablets (8 mg total) by mouth daily. Start the day after chemotherapy for 2 days. Take with food. 30 tablet 1   JUNEL  FE 1/20 1-20 MG-MCG tablet TAKE 1 TABLET BY MOUTH EVERY DAY 84 tablet 1   lidocaine -prilocaine (EMLA) cream Apply to affected area once 30 g 3   ondansetron  (ZOFRAN ) 8 MG tablet Take 1 tablet (8 mg total) by mouth every 8 (eight) hours as needed for nausea or vomiting. Start on the third day after chemotherapy. 30 tablet 1   prochlorperazine (COMPAZINE) 10 MG tablet Take 1 tablet (10 mg total) by mouth every 6 (six)  hours as needed for nausea or vomiting. 30 tablet 1   valACYclovir  (VALTREX ) 500 MG tablet Take 1 tablet (500 mg total) by mouth daily. 90 tablet 3   Cholecalciferol (VITAMIN D3) 1.25 MG (50000 UT) CAPS TAKE 1 CAPSULE BY MOUTH ONE TIME PER WEEK (Patient not taking: Reported on 05/31/2024) 12 capsule 3   folic acid (FOLVITE) 1 MG tablet Take 1 mg by mouth daily. (Patient not taking: Reported on 05/31/2024)     rosuvastatin  (CRESTOR ) 20 MG tablet TAKE 1 TABLET BY MOUTH EVERY DAY (Patient not taking: Reported on 05/31/2024) 90 tablet 3   No current facility-administered medications for this visit.    Review of Systems  Constitutional:  Negative for appetite change, chills, fatigue and fever.  HENT:   Negative for hearing loss and voice change.   Eyes:  Negative for eye problems.  Respiratory:  Negative for chest tightness and cough.   Cardiovascular:  Negative for chest pain.  Gastrointestinal:  Negative for abdominal distention, abdominal pain and blood in stool.  Endocrine: Negative for hot flashes.  Genitourinary:  Negative for difficulty urinating and frequency.   Musculoskeletal:  Negative for arthralgias.  Skin:  Positive for rash. Negative for itching.  Neurological:  Negative for extremity weakness.  Hematological:  Negative for adenopathy.  Psychiatric/Behavioral:  Negative for confusion.      PHYSICAL EXAMINATION: ECOG PERFORMANCE STATUS: 0 - Asymptomatic  Vitals:   05/31/24 0835  BP: (!) 155/93  Pulse: (!) 104  Resp: 20  Temp: (!) 96.9 F (36.1 C)  SpO2: 100%   Filed Weights   05/31/24 0835  Weight: 223 lb 9.6 oz (101.4 kg)    Physical Exam Constitutional:      General: She is not in acute distress.    Appearance: She is not diaphoretic.  HENT:     Head: Normocephalic and atraumatic.  Eyes:  General: No scleral icterus. Cardiovascular:     Rate and Rhythm: Normal rate.  Pulmonary:     Effort: Pulmonary effort is normal. No respiratory distress.   Abdominal:     General: Bowel sounds are normal. There is no distension.     Palpations: Abdomen is soft.  Musculoskeletal:        General: Normal range of motion.     Cervical back: Normal range of motion and neck supple.  Skin:    Findings: Rash present. No erythema.  Neurological:     Mental Status: She is alert and oriented to person, place, and time. Mental status is at baseline.     Cranial Nerves: No cranial nerve deficit.     Motor: No abnormal muscle tone.  Psychiatric:        Mood and Affect: Mood and affect normal.      LABORATORY DATA:  I have reviewed the data as listed    Latest Ref Rng & Units 05/31/2024    8:13 AM 04/07/2024    6:24 AM 04/06/2024    3:41 PM  CBC  WBC 4.0 - 10.5 K/uL 6.1  10.3  14.9   Hemoglobin 12.0 - 15.0 g/dL 89.6  89.6  88.1   Hematocrit 36.0 - 46.0 % 31.1  31.5  35.7   Platelets 150 - 400 K/uL 320  307  316       Latest Ref Rng & Units 05/31/2024    8:13 AM 04/07/2024    6:24 AM 04/06/2024    3:41 PM  CMP  Glucose 70 - 99 mg/dL 96  82  894   BUN 6 - 20 mg/dL 10  9  7    Creatinine 0.44 - 1.00 mg/dL 9.10  9.07  8.96   Sodium 135 - 145 mmol/L 138  140  138   Potassium 3.5 - 5.1 mmol/L 3.6  3.6  3.8   Chloride 98 - 111 mmol/L 106  109  103   CO2 22 - 32 mmol/L 23  23  23    Calcium  8.9 - 10.3 mg/dL 8.7  8.1  8.6   Total Protein 6.5 - 8.1 g/dL 7.1   7.3   Total Bilirubin 0.0 - 1.2 mg/dL 0.7   1.1   Alkaline Phos 38 - 126 U/L 47   54   AST 15 - 41 U/L 15   18   ALT 0 - 44 U/L 11   12      RADIOGRAPHIC STUDIES: I have personally reviewed the radiological images as listed and agreed with the findings in the report. IR IMAGING GUIDED PORT INSERTION Result Date: 05/26/2024 CLINICAL DATA:  Metastatic colorectal carcinoma of the cecum and need for porta cath for chemo EXAM: IMPLANTED PORT A CATH PLACEMENT WITH ULTRASOUND AND FLUOROSCOPIC GUIDANCE ANESTHESIA/SEDATION: Moderate (conscious) sedation was employed during this procedure. A total  of Versed 2.0 mg and Fentanyl 100 mcg was administered intravenously. Moderate Sedation Time: 24 minutes. The patient's level of consciousness and vital signs were monitored continuously by radiology nursing throughout the procedure under my direct supervision. FLUOROSCOPY: Radiation Exposure Index: 4.1 mGy Kerma PROCEDURE: The procedure, risks, benefits, and alternatives were explained to the patient. Questions regarding the procedure were encouraged and answered. The patient understands and consents to the procedure. A time-out was performed prior to initiating the procedure. Ultrasound was utilized to confirm patency of the right internal jugular vein. An ultrasound image was saved and recorded. The right neck and chest were prepped with  chlorhexidine in a sterile fashion, and a sterile drape was applied covering the operative field. Maximum barrier sterile technique with sterile gowns and gloves were used for the procedure. Local anesthesia was provided with 1% lidocaine . After creating a small venotomy incision, a 21 gauge needle was advanced into the right internal jugular vein under direct, real-time ultrasound guidance. Ultrasound image documentation was performed. After securing guidewire access, an 8 Fr dilator was placed. A J-wire was kinked to measure appropriate catheter length. A subcutaneous port pocket was then created along the upper chest wall utilizing sharp and blunt dissection. Portable cautery was utilized. The pocket was irrigated with sterile saline. A single lumen power injectable port was chosen for placement. The 8 Fr catheter was tunneled from the port pocket site to the venotomy incision. The port was placed in the pocket. External catheter was trimmed to appropriate length based on guidewire measurement. At the venotomy, an 8 Fr peel-away sheath was placed over a guidewire. The catheter was then placed through the sheath and the sheath removed. Final catheter positioning was confirmed  and documented with a fluoroscopic spot image. The port was accessed with a needle and aspirated and flushed with heparinized saline. The access needle was removed. The venotomy and port pocket incisions were closed with subcutaneous 3-0 Monocryl and subcuticular 4-0 Vicryl. Dermabond was applied to both incisions. COMPLICATIONS: COMPLICATIONS None FINDINGS: After catheter placement, the tip lies at the cavo-atrial junction. The catheter aspirates normally and is ready for immediate use. IMPRESSION: Placement of single lumen port a cath via right internal jugular vein. The catheter tip lies at the cavo-atrial junction. A power injectable port a cath was placed and is ready for immediate use. Electronically Signed   By: Marcey Moan M.D.   On: 05/26/2024 11:23

## 2024-05-31 NOTE — Telephone Encounter (Signed)
 Testing results available and copy given to provider. Results also sent to scan

## 2024-05-31 NOTE — Assessment & Plan Note (Signed)
Chemotherapy treatment as listed above. 

## 2024-05-31 NOTE — Assessment & Plan Note (Signed)
 Rash developed prior to chemotherapy. Possibly allergy due to medications used during procedure.  Recommend Benadryl  PRN and also she will take Dexamethasone 8mg  daily x 2 days

## 2024-06-01 ENCOUNTER — Telehealth: Payer: Self-pay

## 2024-06-01 NOTE — Telephone Encounter (Signed)
Telephone call to patient for follow up after receiving first infusion.   Patient states infusion went great.  States eating good and drinking plenty of fluids.   Denies any nausea or vomiting.  Encouraged patient to call for any concerns or questions. 

## 2024-06-02 ENCOUNTER — Inpatient Hospital Stay

## 2024-06-02 ENCOUNTER — Other Ambulatory Visit: Payer: Self-pay

## 2024-06-02 ENCOUNTER — Encounter: Payer: Self-pay | Admitting: Medical Oncology

## 2024-06-07 ENCOUNTER — Inpatient Hospital Stay

## 2024-06-07 ENCOUNTER — Encounter: Payer: Self-pay | Admitting: Oncology

## 2024-06-07 ENCOUNTER — Inpatient Hospital Stay: Admitting: Oncology

## 2024-06-07 VITALS — BP 137/95 | HR 87 | Temp 96.9°F | Resp 18 | Ht 69.0 in | Wt 222.0 lb

## 2024-06-07 DIAGNOSIS — C180 Malignant neoplasm of cecum: Secondary | ICD-10-CM | POA: Diagnosis not present

## 2024-06-07 DIAGNOSIS — D509 Iron deficiency anemia, unspecified: Secondary | ICD-10-CM | POA: Diagnosis not present

## 2024-06-07 DIAGNOSIS — Z5111 Encounter for antineoplastic chemotherapy: Secondary | ICD-10-CM | POA: Diagnosis not present

## 2024-06-07 LAB — CBC WITH DIFFERENTIAL (CANCER CENTER ONLY)
Abs Immature Granulocytes: 0.04 K/uL (ref 0.00–0.07)
Basophils Absolute: 0 K/uL (ref 0.0–0.1)
Basophils Relative: 0 %
Eosinophils Absolute: 0.1 K/uL (ref 0.0–0.5)
Eosinophils Relative: 2 %
HCT: 32.3 % — ABNORMAL LOW (ref 36.0–46.0)
Hemoglobin: 10.4 g/dL — ABNORMAL LOW (ref 12.0–15.0)
Immature Granulocytes: 1 %
Lymphocytes Relative: 35 %
Lymphs Abs: 2 K/uL (ref 0.7–4.0)
MCH: 23.7 pg — ABNORMAL LOW (ref 26.0–34.0)
MCHC: 32.2 g/dL (ref 30.0–36.0)
MCV: 73.7 fL — ABNORMAL LOW (ref 80.0–100.0)
Monocytes Absolute: 0.2 K/uL (ref 0.1–1.0)
Monocytes Relative: 4 %
Neutro Abs: 3.2 K/uL (ref 1.7–7.7)
Neutrophils Relative %: 58 %
Platelet Count: 223 K/uL (ref 150–400)
RBC: 4.38 MIL/uL (ref 3.87–5.11)
RDW: 14.6 % (ref 11.5–15.5)
WBC Count: 5.5 K/uL (ref 4.0–10.5)
nRBC: 0 % (ref 0.0–0.2)

## 2024-06-07 LAB — CMP (CANCER CENTER ONLY)
ALT: 14 U/L (ref 0–44)
AST: 18 U/L (ref 15–41)
Albumin: 3.4 g/dL — ABNORMAL LOW (ref 3.5–5.0)
Alkaline Phosphatase: 41 U/L (ref 38–126)
Anion gap: 8 (ref 5–15)
BUN: 15 mg/dL (ref 6–20)
CO2: 23 mmol/L (ref 22–32)
Calcium: 8.7 mg/dL — ABNORMAL LOW (ref 8.9–10.3)
Chloride: 104 mmol/L (ref 98–111)
Creatinine: 1.03 mg/dL — ABNORMAL HIGH (ref 0.44–1.00)
GFR, Estimated: >60 mL/min (ref >60)
Glucose, Bld: 85 mg/dL (ref 70–99)
Potassium: 3.8 mmol/L (ref 3.5–5.1)
Sodium: 135 mmol/L (ref 135–145)
Total Bilirubin: 0.8 mg/dL (ref 0.0–1.2)
Total Protein: 6.9 g/dL (ref 6.5–8.1)

## 2024-06-07 NOTE — Assessment & Plan Note (Addendum)
 Lab Results  Component Value Date   HGB 10.4 (L) 06/07/2024   TIBC 377 04/08/2024   IRONPCTSAT 5 (L) 04/08/2024   FERRITIN 63 04/08/2024     she prefers to take oral iron supplementation. Hb remains stable,, Monitor counts.

## 2024-06-07 NOTE — Progress Notes (Signed)
 No IVF today

## 2024-06-07 NOTE — Progress Notes (Signed)
 Hematology/Oncology Progress note [phone] [phone]     CHIEF COMPLAINTS/PURPOSE OF CONSULTATION:  Cecal adenocarcinoma liver lesions   ASSESSMENT & PLAN:   Cancer Staging  Primary cancer of cecum [facility]) Staging form: Colon and Rectum, AJCC 8th Edition - Clinical stage from 04/15/2024: cT2, cN1, cM1 - Signed by Babara Call, MD on 05/31/2024   Primary cancer of cecum [facility]) Primary cecal adenocarcinoma, ileocolic mesenteric adenopathy and liver lesions. Baseline CEA was elevated at 32.8. pMMR  2nd opinion recommendation at [facility] was reviewed.  Recommend liver lesion biopsy patient declined.  Recommend molecular profiling- NGS testing. She has no desire of fertility preservation.  Recommend genetic testing-patient reports that she has a genetic appointment at [facility] in Feb 2026  Labs are reviewed and discussed with patient. S/p cycle 1 FOLFOX with day 3 pump DC - she tolerated well.   She may discontinue OCPs and use condoms.  Will check urine pregnancy testing prior to chemotherapy  Encourage oral hydration. Cr is slightly elevated eGFR >60   Iron deficiency anemia Lab Results  Component Value Date   HGB 10.4 (L) 06/07/2024   TIBC 377 04/08/2024   IRONPCTSAT 5 (L) 04/08/2024   FERRITIN 63 04/08/2024     she prefers to take oral iron supplementation. Hb remains stable,, Monitor counts.    No orders of the defined types were placed in this encounter.  Follow-up per LOS All questions were answered. The patient knows to call the clinic with any problems, questions or concerns.  Call Babara, MD, PhD [facility] Hematology Oncology 06/07/2024    HISTORY OF PRESENTING ILLNESS:  Amber Price 43 y.o. female presents to establish care for stage IV cecal adenocarcinoma I have reviewed her chart and materials related to her cancer extensively and collaborated history with the patient. Summary of oncologic history is as follows: Oncology History  Primary  cancer of cecum (HCC)  04/06/2024 Imaging   CT abdomen pelvis with contrast showed 1. Asymmetric wall thickening and enhancement in the cecum, measuring 4.3 cm in craniocaudal dimension, and involving the appendiceal orifice (axial 50, sagittal 61), worrisome for underlying colonic neoplasm. Likely causing obstruction of the appendix, which is fluid-filled and dilated appendix, suggesting acute appendicitis. Surgical consultation recommended. 2. A couple of mildly prominent right ileocolic chain lymph nodes are present measuring up to 7 mm (axial 50-52). These could be reactive or metastatic. 3. There are 3 hypodense lesions in both lobes of the liver, measuring up to 1.8 cm, likely metastatic disease.     04/06/2024 Imaging   CT chest angiogram No evidence of pulm embolism or other acute intrathoracic process.   04/07/2024 Imaging   MRI of liver with and without contrast showed 1. Multiple bilateral liver lesions. 2 left hepatic lobe lesions are highly suspicious for metastasis. 1 right liver lobe lesion is indeterminate but favored to represent a hemangioma. 2 smaller right liver lobe lesions are favored to be benign, likely hemangiomas. Consider sampling of the segment 2 lesion versus further evaluation with PET. 2.  Cecal mass with adjacent adenopathy, suspicious for nodal metastasis.  04/08/2024, colonoscopy showed fungating partially obstructing large mass in the cecum. Biopsy pathology showed invasive moderately differentiated adenocarcinoma. pMMR    04/15/2024 Initial Diagnosis   Primary cancer of cecum   Patient presented to emergency room for evaluation of UTI symptoms.  She was found to have sepsis secondary to E. coli bacteremia, acute UTI.  Urine culture grew pansensitive E. coli.  Patient was treated with IV  antibiotics and discharged on oral Levaquin .  CT obtained during hospitalization showed Asymmetric wall thickening and enhancement in the cecum , measuring 4.3 cm  worrisome for underlying colonic neoplasm.  There are 3 hypodense liver lesions in both lobes of the liver.  Mildly prominent right ileocolic chain lymph nodes. MRI liver showed multiple bilateral liver lesions.    04/08/2024 colonoscopy showed Likely cecal primary with hepatic metastases. No other sites of metabolically active disease are seen. The prominent mesenteric nodes are visible on noncontrast CT but are not significantly avid above background.   Pathology showed invasive moderately differentiated adenocarcinoma.  pMMR Tempus liquid biopsy showed KRAS pG12V    04/15/2024 Cancer Staging   Staging form: Colon and Rectum, AJCC 8th Edition - Clinical stage from 04/15/2024: cT2, cN1, cM1 - Signed by Babara Call, MD on 05/31/2024 Stage prefix: Initial diagnosis Total positive nodes: 1   04/28/2024 Imaging   PET scan showed Liver: Lesions described in the liver in segments 2 and 4B (SUV max 14.1, CT 129 and 7.3, CT 140). The other lesion in the anterior right hepatic lobe is not metabolically active. The lesions described as likely hemangiomas do not show uptake above background.   Likely cecal primary with hepatic metastases. No other sites of metabolically active disease are seen. The prominent mesenteric nodes are visible on noncontrast CT but are not significantly avid above background.      05/31/2024 -  Chemotherapy   Patient is on Treatment Plan : COLORECTAL FOLFOX q14d x 3 months      Patient denies family history of colon cancer.  Paternal grandmother had lung cancer. She denies any rectal bleeding, melena or change of bowel habits.  Patient is on methotrexate for rheumatoid arthritis. Denies unintentional weight loss, fever, night sweats.  Patient presents to discuss management plan.  Accompanied by her colleague.  She was seen by [facility] Dr. Jia. PET scan was done. She was recommended for liver lesion biopsy and she declined.  She reports having stopped all her  medication. No longer on Methotrexate.    INTERVAL HISTORY Amber Price is a 43 y.o. 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   Discussed the use of AI scribe software for clinical note transcription with the patient, who gave verbal consent to proceed.   S/p 1 cycle of FOLFOX  She experiences no nausea, vomiting, or diarrhea following chemotherapy. She is able to drink room temperature water without issues related to cold sensitivity, which she experienced previously during treatment.  She has concerns about birth control use during chemotherapy. She is sexually active and had stopped taking birth control when she started chemotherapy, leading to the resumption of her menstrual cycle. She is worried about the potential for pregnancy while on chemotherapy and the risk of blood clots associated with birth control pills, especially given her active cancer. She does not desire further fertility preservation as she already has a grandchild.  She is currently on amlodipine  for blood pressure management but has not been taking it regularly. Her blood pressure is sometimes high, possibly due to anxiety. She has a home blood pressure monitor and reports her blood pressure is usually in the 120s at home.  She is prescribed Valtrex  for herpes prophylaxis but has been taking it as needed rather than daily.  She reports difficulty sleeping due to discomfort from her port placement, not due to anxiety. She is  trying to find a comfortable sleeping position and has been using a neck pillow to aid in sleeping on her back.    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 RECK 2018 ANNUAL   Sickle cell trait    Vitamin D  deficiency 01/2016   ADD VIT D3 5000 IU DAILY    SURGICAL HISTORY: Past Surgical History:  Procedure Laterality Date   COLONOSCOPY N/A 04/08/2024   Procedure: COLONOSCOPY;  Surgeon: Jinny Carmine, MD;  Location: [facility] ENDOSCOPY;  Service: Endoscopy;  Laterality: N/A;   IR IMAGING GUIDED PORT INSERTION  05/26/2024   NO PAST SURGERIES      SOCIAL HISTORY: Social History   Socioeconomic History   Marital status: Single    Spouse name: Not on file   Number of children: 3   Years of education: 14   Highest education level: Not on file  Occupational History   Occupation: BUSINESS OFFICE SPECIALIST  Tobacco Use   Smoking status: Never   Smokeless tobacco: Never  Vaping Use   Vaping status: Never Used  Substance and Sexual Activity   Alcohol use: Not Currently   Drug use: No   Sexual activity: Yes    Birth control/protection: Pill  Other Topics Concern   Not on file  Social History Narrative   Not on file   Social Drivers of Health   Financial Resource Strain: Low Risk (04/19/2024)   Received from [facility]   Overall Financial Resource Strain (CARDIA)    How hard is it for you to pay for the very basics like food, housing, medical care, and heating?: Not very hard  Recent Concern: Financial Resource Strain - Medium Risk (04/06/2024)   Received from [facility]   Overall Financial Resource Strain (CARDIA)    Difficulty of Paying Living Expenses: Somewhat hard  Food Insecurity: No Food Insecurity (04/19/2024)   Received from [facility]   Hunger Vital Sign    Within the past 12 months, you worried that your food would run out before you got the money to buy more.: Never true    Within the past 12 months, the food you bought just didn't last and you didn't have money to get more.: Never true  Recent Concern: Food Insecurity - Food Insecurity Present (04/12/2024)   Hunger  Vital Sign    Worried About Running Out of Food in the Last Year: Sometimes true    Ran Out of Food in the Last Year: Never true  Transportation Needs: No Transportation Needs (04/19/2024)   Received from [facility] - Transportation    Lack of Transportation (Medical): No    Lack of Transportation (Non-Medical): No  Physical Activity: Not on file  Stress: Not on file  Social Connections: Not on file  Intimate Partner Violence: Not At Risk (04/15/2024)   Humiliation, Afraid, Rape, and Kick questionnaire    Fear of Current or Ex-Partner: No    Emotionally Abused: No    Physically Abused: No    Sexually Abused: No    FAMILY HISTORY: Family History  Problem Relation Age  of Onset   Diabetes Mellitus II Mother    Hypertension Mother    Hypercholesterolemia Father    Cancer Paternal Grandmother 72       LUNG   Sickle cell trait Son     ALLERGIES:  is allergic to tape.  MEDICATIONS:  Current Outpatient Medications  Medication Sig Dispense Refill   amLODipine  (NORVASC ) 5 MG tablet Take 1 tablet (5 mg total) by mouth daily. 30 tablet 11   dexamethasone  (DECADRON ) 4 MG tablet Take 2 tablets (8 mg total) by mouth daily. Start the day after chemotherapy for 2 days. Take with food. 30 tablet 1   lidocaine -prilocaine  (EMLA ) cream Apply to affected area once 30 g 3   ondansetron  (ZOFRAN ) 8 MG tablet Take 1 tablet (8 mg total) by mouth every 8 (eight) hours as needed for nausea or vomiting. Start on the third day after chemotherapy. 30 tablet 1   prochlorperazine  (COMPAZINE ) 10 MG tablet Take 1 tablet (10 mg total) by mouth every 6 (six) hours as needed for nausea or vomiting. 30 tablet 1   valACYclovir  (VALTREX ) 500 MG tablet Take 1 tablet (500 mg total) by mouth daily. 90 tablet 3   Cholecalciferol (VITAMIN D3) 1.25 MG (50000 UT) CAPS TAKE 1 CAPSULE BY MOUTH ONE TIME PER WEEK (Patient not taking: Reported on 06/07/2024) 12 capsule 3   folic acid (FOLVITE) 1 MG tablet Take 1 mg  by mouth daily. (Patient not taking: Reported on 06/07/2024)     JUNEL  FE 1/20 1-20 MG-MCG tablet TAKE 1 TABLET BY MOUTH EVERY DAY (Patient not taking: Reported on 06/07/2024) 84 tablet 1   rosuvastatin  (CRESTOR ) 20 MG tablet TAKE 1 TABLET BY MOUTH EVERY DAY (Patient not taking: Reported on 06/07/2024) 90 tablet 3   No current facility-administered medications for this visit.    Review of Systems  Constitutional:  Negative for appetite change, chills, fatigue and fever.  HENT:   Negative for hearing loss and voice change.   Eyes:  Negative for eye problems.  Respiratory:  Negative for chest tightness and cough.   Cardiovascular:  Negative for chest pain.  Gastrointestinal:  Negative for abdominal distention, abdominal pain and blood in stool.  Endocrine: Negative for hot flashes.  Genitourinary:  Negative for difficulty urinating and frequency.   Musculoskeletal:  Negative for arthralgias.  Skin:  Negative for itching and rash.  Neurological:  Negative for extremity weakness.  Hematological:  Negative for adenopathy.  Psychiatric/Behavioral:  Positive for sleep disturbance. Negative for confusion.      PHYSICAL EXAMINATION: ECOG PERFORMANCE STATUS: 0 - Asymptomatic  Vitals:   06/07/24 0909  BP: (!) 137/95  Pulse: 87  Resp: 18  Temp: (!) 96.9 F (36.1 C)  SpO2: 100%   Filed Weights   06/07/24 0909  Weight: 222 lb (100.7 kg)    Physical Exam Constitutional:      General: She is not in acute distress.    Appearance: She is not diaphoretic.  HENT:     Head: Normocephalic and atraumatic.  Eyes:     General: No scleral icterus. Cardiovascular:     Rate and Rhythm: Normal rate.  Pulmonary:     Effort: Pulmonary effort is normal. No respiratory distress.  Abdominal:     General: Bowel sounds are normal. There is no distension.     Palpations: Abdomen is soft.  Musculoskeletal:        General: Normal range of motion.     Cervical back: Normal range of motion and  neck  supple.  Skin:    Findings: Rash present. No erythema.  Neurological:     Mental Status: She is alert and oriented to person, place, and time. Mental status is at baseline.     Cranial Nerves: No cranial nerve deficit.     Motor: No abnormal muscle tone.  Psychiatric:        Mood and Affect: Mood and affect normal.      LABORATORY DATA:  I have reviewed the data as listed    Latest Ref Rng & Units 06/07/2024    8:44 AM 05/31/2024    8:13 AM 04/07/2024    6:24 AM  CBC  WBC 4.0 - 10.5 K/uL 5.5  6.1  10.3   Hemoglobin 12.0 - 15.0 g/dL 89.5  89.6  89.6   Hematocrit 36.0 - 46.0 % 32.3  31.1  31.5   Platelets 150 - 400 K/uL 223  320  307       Latest Ref Rng & Units 06/07/2024    8:44 AM 05/31/2024    8:13 AM 04/07/2024    6:24 AM  CMP  Glucose 70 - 99 mg/dL 85  96  82   BUN 6 - 20 mg/dL 15  10  9    Creatinine 0.44 - 1.00 mg/dL 8.96  9.10  9.07   Sodium 135 - 145 mmol/L 135  138  140   Potassium 3.5 - 5.1 mmol/L 3.8  3.6  3.6   Chloride 98 - 111 mmol/L 104  106  109   CO2 22 - 32 mmol/L 23  23  23    Calcium  8.9 - 10.3 mg/dL 8.7  8.7  8.1   Total Protein 6.5 - 8.1 g/dL 6.9  7.1    Total Bilirubin 0.0 - 1.2 mg/dL 0.8  0.7    Alkaline Phos 38 - 126 U/L 41  47    AST 15 - 41 U/L 18  15    ALT 0 - 44 U/L 14  11       RADIOGRAPHIC STUDIES: I have personally reviewed the radiological images as listed and agreed with the findings in the report. IR IMAGING GUIDED PORT INSERTION Result Date: 05/26/2024 CLINICAL DATA:  Metastatic colorectal carcinoma of the cecum and need for porta cath for chemo EXAM: IMPLANTED PORT A CATH PLACEMENT WITH ULTRASOUND AND FLUOROSCOPIC GUIDANCE ANESTHESIA/SEDATION: Moderate (conscious) sedation was employed during this procedure. A total of Versed  2.0 mg and Fentanyl  100 mcg was administered intravenously. Moderate Sedation Time: 24 minutes. The patient's level of consciousness and vital signs were monitored continuously by radiology nursing throughout  the procedure under my direct supervision. FLUOROSCOPY: Radiation Exposure Index: 4.1 mGy Kerma PROCEDURE: The procedure, risks, benefits, and alternatives were explained to the patient. Questions regarding the procedure were encouraged and answered. The patient understands and consents to the procedure. A time-out was performed prior to initiating the procedure. Ultrasound was utilized to confirm patency of the right internal jugular vein. An ultrasound image was saved and recorded. The right neck and chest were prepped with chlorhexidine in a sterile fashion, and a sterile drape was applied covering the operative field. Maximum barrier sterile technique with sterile gowns and gloves were used for the procedure. Local anesthesia was provided with 1% lidocaine . After creating a small venotomy incision, a 21 gauge needle was advanced into the right internal jugular vein under direct, real-time ultrasound guidance. Ultrasound image documentation was performed. After securing guidewire access, an 8 Fr dilator was placed. A J-wire  was kinked to measure appropriate catheter length. A subcutaneous port pocket was then created along the upper chest wall utilizing sharp and blunt dissection. Portable cautery was utilized. The pocket was irrigated with sterile saline. A single lumen power injectable port was chosen for placement. The 8 Fr catheter was tunneled from the port pocket site to the venotomy incision. The port was placed in the pocket. External catheter was trimmed to appropriate length based on guidewire measurement. At the venotomy, an 8 Fr peel-away sheath was placed over a guidewire. The catheter was then placed through the sheath and the sheath removed. Final catheter positioning was confirmed and documented with a fluoroscopic spot image. The port was accessed with a needle and aspirated and flushed with heparinized saline. The access needle was removed. The venotomy and port pocket incisions were closed with  subcutaneous 3-0 Monocryl and subcuticular 4-0 Vicryl. Dermabond was applied to both incisions. COMPLICATIONS: COMPLICATIONS None FINDINGS: After catheter placement, the tip lies at the cavo-atrial junction. The catheter aspirates normally and is ready for immediate use. IMPRESSION: Placement of single lumen port a cath via right internal jugular vein. The catheter tip lies at the cavo-atrial junction. A power injectable port a cath was placed and is ready for immediate use. Electronically Signed   By: Marcey Moan M.D.   On: 05/26/2024 11:23

## 2024-06-07 NOTE — Assessment & Plan Note (Addendum)
 Primary cecal adenocarcinoma, ileocolic mesenteric adenopathy and liver lesions. Baseline CEA was elevated at 32.8. pMMR  2nd opinion recommendation at [facility] was reviewed.  Recommend liver lesion biopsy patient declined.  Recommend molecular profiling- NGS testing. She has no desire of fertility preservation.  Recommend genetic testing-patient reports that she has a genetic appointment at [facility] in Feb 2026  Labs are reviewed and discussed with patient. S/p cycle 1 FOLFOX with day 3 pump DC - she tolerated well.   She may discontinue OCPs and use condoms.  Will check urine pregnancy testing prior to chemotherapy  Encourage oral hydration. Cr is slightly elevated eGFR >60

## 2024-06-07 NOTE — Progress Notes (Signed)
 Patient has some questions in regards to some of the medicines she is taking and how they interact with each other? She is having a little bit of trouble sleeping.

## 2024-06-14 ENCOUNTER — Inpatient Hospital Stay

## 2024-06-14 ENCOUNTER — Inpatient Hospital Stay (HOSPITAL_BASED_OUTPATIENT_CLINIC_OR_DEPARTMENT_OTHER): Admitting: Oncology

## 2024-06-14 ENCOUNTER — Encounter: Payer: Self-pay | Admitting: Oncology

## 2024-06-14 ENCOUNTER — Inpatient Hospital Stay: Attending: Oncology

## 2024-06-14 VITALS — BP 128/86 | HR 122 | Temp 96.0°F | Resp 18 | Wt 221.7 lb

## 2024-06-14 VITALS — HR 95

## 2024-06-14 DIAGNOSIS — D701 Agranulocytosis secondary to cancer chemotherapy: Secondary | ICD-10-CM | POA: Diagnosis not present

## 2024-06-14 DIAGNOSIS — Z79631 Long term (current) use of antimetabolite agent: Secondary | ICD-10-CM | POA: Diagnosis not present

## 2024-06-14 DIAGNOSIS — Z5111 Encounter for antineoplastic chemotherapy: Secondary | ICD-10-CM | POA: Insufficient documentation

## 2024-06-14 DIAGNOSIS — R000 Tachycardia, unspecified: Secondary | ICD-10-CM | POA: Insufficient documentation

## 2024-06-14 DIAGNOSIS — C787 Secondary malignant neoplasm of liver and intrahepatic bile duct: Secondary | ICD-10-CM

## 2024-06-14 DIAGNOSIS — C180 Malignant neoplasm of cecum: Secondary | ICD-10-CM | POA: Diagnosis not present

## 2024-06-14 DIAGNOSIS — E860 Dehydration: Secondary | ICD-10-CM | POA: Insufficient documentation

## 2024-06-14 DIAGNOSIS — T451X5A Adverse effect of antineoplastic and immunosuppressive drugs, initial encounter: Secondary | ICD-10-CM | POA: Insufficient documentation

## 2024-06-14 DIAGNOSIS — D509 Iron deficiency anemia, unspecified: Secondary | ICD-10-CM

## 2024-06-14 DIAGNOSIS — C801 Malignant (primary) neoplasm, unspecified: Secondary | ICD-10-CM

## 2024-06-14 DIAGNOSIS — F411 Generalized anxiety disorder: Secondary | ICD-10-CM | POA: Insufficient documentation

## 2024-06-14 DIAGNOSIS — Z79630 Long term (current) use of alkylating agent: Secondary | ICD-10-CM | POA: Diagnosis not present

## 2024-06-14 LAB — CBC WITH DIFFERENTIAL (CANCER CENTER ONLY)
Abs Immature Granulocytes: 0.01 K/uL (ref 0.00–0.07)
Basophils Absolute: 0 K/uL (ref 0.0–0.1)
Basophils Relative: 1 %
Eosinophils Absolute: 0.1 K/uL (ref 0.0–0.5)
Eosinophils Relative: 2 %
HCT: 33.1 % — ABNORMAL LOW (ref 36.0–46.0)
Hemoglobin: 10.8 g/dL — ABNORMAL LOW (ref 12.0–15.0)
Immature Granulocytes: 0 %
Lymphocytes Relative: 45 %
Lymphs Abs: 1.7 K/uL (ref 0.7–4.0)
MCH: 24.1 pg — ABNORMAL LOW (ref 26.0–34.0)
MCHC: 32.6 g/dL (ref 30.0–36.0)
MCV: 73.7 fL — ABNORMAL LOW (ref 80.0–100.0)
Monocytes Absolute: 0.4 K/uL (ref 0.1–1.0)
Monocytes Relative: 12 %
Neutro Abs: 1.5 K/uL — ABNORMAL LOW (ref 1.7–7.7)
Neutrophils Relative %: 40 %
Platelet Count: 204 K/uL (ref 150–400)
RBC: 4.49 MIL/uL (ref 3.87–5.11)
RDW: 14.9 % (ref 11.5–15.5)
WBC Count: 3.7 K/uL — ABNORMAL LOW (ref 4.0–10.5)
nRBC: 0 % (ref 0.0–0.2)

## 2024-06-14 LAB — CMP (CANCER CENTER ONLY)
ALT: 25 U/L (ref 0–44)
AST: 28 U/L (ref 15–41)
Albumin: 3.6 g/dL (ref 3.5–5.0)
Alkaline Phosphatase: 63 U/L (ref 38–126)
Anion gap: 10 (ref 5–15)
BUN: 9 mg/dL (ref 6–20)
CO2: 23 mmol/L (ref 22–32)
Calcium: 8.9 mg/dL (ref 8.9–10.3)
Chloride: 105 mmol/L (ref 98–111)
Creatinine: 1.01 mg/dL — ABNORMAL HIGH (ref 0.44–1.00)
GFR, Estimated: >60 mL/min (ref >60)
Glucose, Bld: 109 mg/dL — ABNORMAL HIGH (ref 70–99)
Potassium: 3.7 mmol/L (ref 3.5–5.1)
Sodium: 138 mmol/L (ref 135–145)
Total Bilirubin: 0.6 mg/dL (ref 0.0–1.2)
Total Protein: 7.2 g/dL (ref 6.5–8.1)

## 2024-06-14 MED ORDER — OXALIPLATIN CHEMO INJECTION 100 MG/20ML
85.0000 mg/m2 | Freq: Once | INTRAVENOUS | Status: AC
  Administered 2024-06-14: 200 mg via INTRAVENOUS
  Filled 2024-06-14: qty 40

## 2024-06-14 MED ORDER — SODIUM CHLORIDE 0.9 % IV SOLN
Freq: Once | INTRAVENOUS | Status: AC
  Filled 2024-06-14: qty 250

## 2024-06-14 MED ORDER — FLUOROURACIL CHEMO INJECTION 2.5 GM/50ML
400.0000 mg/m2 | Freq: Once | INTRAVENOUS | Status: AC
  Administered 2024-06-14: 900 mg via INTRAVENOUS
  Filled 2024-06-14: qty 18

## 2024-06-14 MED ORDER — DEXAMETHASONE SOD PHOSPHATE PF 10 MG/ML IJ SOLN
10.0000 mg | Freq: Once | INTRAMUSCULAR | Status: AC
  Administered 2024-06-14: 10 mg via INTRAVENOUS

## 2024-06-14 MED ORDER — LORAZEPAM 0.5 MG PO TABS: 0.5000 mg | ORAL_TABLET | Freq: Two times a day (BID) | ORAL | 0 refills | Status: AC | PRN

## 2024-06-14 MED ORDER — PALONOSETRON HCL INJECTION 0.25 MG/5ML
0.2500 mg | Freq: Once | INTRAVENOUS | Status: AC
  Administered 2024-06-14: 0.25 mg via INTRAVENOUS
  Filled 2024-06-14: qty 5

## 2024-06-14 MED ORDER — SODIUM CHLORIDE 0.9 % IV SOLN
2400.0000 mg/m2 | INTRAVENOUS | Status: DC
  Administered 2024-06-14: 5000 mg via INTRAVENOUS
  Filled 2024-06-14: qty 100

## 2024-06-14 MED ORDER — DEXTROSE 5 % IV SOLN
INTRAVENOUS | Status: DC
  Filled 2024-06-14: qty 250

## 2024-06-14 MED ORDER — LEUCOVORIN CALCIUM INJECTION 350 MG
400.0000 mg/m2 | Freq: Once | INTRAVENOUS | Status: AC
  Administered 2024-06-14: 896 mg via INTRAVENOUS
  Filled 2024-06-14: qty 44.8

## 2024-06-14 MED ORDER — LORAZEPAM 2 MG/ML IJ SOLN
0.5000 mg | Freq: Once | INTRAMUSCULAR | Status: AC | PRN
  Administered 2024-06-14: 0.5 mg via INTRAVENOUS
  Filled 2024-06-14: qty 1

## 2024-06-14 NOTE — Assessment & Plan Note (Signed)
 Previously she declined liver lesion biopsy. She will repeat imaging at [facility] in the future for evaluation of treatment response.

## 2024-06-14 NOTE — Progress Notes (Signed)
 Hematology/Oncology Progress note [phone] [phone]     CHIEF COMPLAINTS/PURPOSE OF CONSULTATION:  Amber Price   ASSESSMENT & PLAN:   Cancer Staging  Primary cancer of cecum [facility]) Staging form: Price and Rectum, AJCC 8th Edition - Clinical stage from 04/15/2024: cT2, cN1, cM1 - Signed by Babara Call, MD on 05/31/2024   Primary cancer of cecum [facility] Primary Amber adenocarcinoma, ileocolic mesenteric adenopathy and liver Price. Baseline CEA was elevated at 32.8. pMMR  2nd opinion recommendation at [facility] was reviewed.  Recommend liver lesion biopsy patient declined.  Recommend molecular profiling- NGS testing. She has no desire of fertility preservation.  Recommend genetic testing-patient reports that she has a genetic appointment at [facility] in Feb 2026  Labs are reviewed and discussed with patient. Proceed with cycle 2 FOLFOX with day 3 pump DC   Encourage oral hydration. Cr is slightly elevated eGFR >60   Encounter for antineoplastic chemotherapy Chemotherapy treatment as listed above  Iron deficiency anemia Lab Results  Component Value Date   HGB 10.8 (L) 06/14/2024   TIBC 377 04/08/2024   IRONPCTSAT 5 (L) 04/08/2024   FERRITIN 63 04/08/2024     she prefers to take oral iron supplementation. Hb remains stable, Monitor counts.   Metastasis to liver [facility]) Previously she declined liver lesion biopsy. She will repeat imaging at [facility] in the future for evaluation of treatment response.   Anxiety associated with cancer diagnosis (HCC) Recommend Lorazepam 0.5mg  Q12 hours PRN anxiety, nausea sleep.  Rationale and side effects were reviewed with patient. ' Rx was sent to pharmacy.   Tachycardia Possibly due to anxiety and dehydration.  IVF hydration today, Ativan 0.5mg  x 1 prior to chemo.  Consider cardiology evaluation if HR remains elevated.    No orders of the defined types were placed in this encounter.  Follow-up per  LOS All questions were answered. The patient knows to call the clinic with any problems, questions or concerns.  Call Babara, MD, PhD [facility] Hematology Oncology 06/14/2024    HISTORY OF PRESENTING ILLNESS:  Amber Price 43 y.o. female presents to establish care for stage IV Amber adenocarcinoma I have reviewed her chart and materials related to her cancer extensively and collaborated history with the patient. Summary of oncologic history is as follows: Oncology History  Primary cancer of cecum (HCC)  04/06/2024 Imaging   CT abdomen pelvis with contrast showed 1. Asymmetric wall thickening and enhancement in the cecum, measuring 4.3 cm in craniocaudal dimension, and involving the appendiceal orifice (axial 50, sagittal 61), worrisome for underlying colonic neoplasm. Likely causing obstruction of the appendix, which is fluid-filled and dilated appendix, suggesting acute appendicitis. Surgical consultation recommended. 2. A couple of mildly prominent right ileocolic chain lymph nodes are present measuring up to 7 mm (axial 50-52). These could be reactive or metastatic. 3. There are 3 hypodense Price in both lobes of the liver, measuring up to 1.8 cm, likely metastatic disease.     04/06/2024 Imaging   CT chest angiogram No evidence of pulm embolism or other acute intrathoracic process.   04/07/2024 Imaging   MRI of liver with and without contrast showed 1. Multiple bilateral liver Price. 2 left hepatic lobe Price are highly suspicious for metastasis. 1 right liver lobe lesion is indeterminate but favored to represent a hemangioma. 2 smaller right liver lobe Price are favored to be benign, likely hemangiomas. Consider sampling of the segment 2 lesion versus further evaluation with PET. 2.  Amber mass with adjacent  adenopathy, suspicious for nodal metastasis.  04/08/2024, colonoscopy showed fungating partially obstructing large mass in the cecum. Biopsy pathology showed  invasive moderately differentiated adenocarcinoma. pMMR    04/15/2024 Initial Diagnosis   Primary cancer of cecum   Patient presented to emergency room for evaluation of UTI symptoms.  She was found to have sepsis secondary to E. coli bacteremia, acute UTI.  Urine culture grew pansensitive E. coli.  Patient was treated with IV antibiotics and discharged on oral Levaquin .  CT obtained during hospitalization showed Asymmetric wall thickening and enhancement in the cecum , measuring 4.3 cm worrisome for underlying colonic neoplasm.  There are 3 hypodense liver Price in both lobes of the liver.  Mildly prominent right ileocolic chain lymph nodes. MRI liver showed multiple bilateral liver Price.    04/08/2024 colonoscopy showed Likely Amber primary with hepatic metastases. No other sites of metabolically active disease are seen. The prominent mesenteric nodes are visible on noncontrast CT but are not significantly avid above background.   Pathology showed invasive moderately differentiated adenocarcinoma.  pMMR Tempus liquid biopsy showed KRAS pG12V    04/15/2024 Cancer Staging   Staging form: Price and Rectum, AJCC 8th Edition - Clinical stage from 04/15/2024: cT2, cN1, cM1 - Signed by Babara Call, MD on 05/31/2024 Stage prefix: Initial diagnosis Total positive nodes: 1   04/28/2024 Imaging   PET scan showed Liver: Price described in the liver in segments 2 and 4B (SUV max 14.1, CT 129 and 7.3, CT 140). The other lesion in the anterior right hepatic lobe is not metabolically active. The Price described as likely hemangiomas do not show uptake above background.   Likely Amber primary with hepatic metastases. No other sites of metabolically active disease are seen. The prominent mesenteric nodes are visible on noncontrast CT but are not significantly avid above background.      05/31/2024 -  Chemotherapy   Patient is on Treatment Plan : COLORECTAL FOLFOX q14d x 3 months      Patient  denies family history of Price cancer.  Paternal grandmother had lung cancer. She denies any rectal bleeding, melena or change of bowel habits.  Patient is on methotrexate for rheumatoid arthritis. Denies unintentional weight loss, fever, night sweats.  Patient presents to discuss management plan.  Accompanied by her colleague.  She was seen by [facility] Dr. Jia. PET scan was done. She was recommended for liver lesion biopsy and she declined.  She reports having stopped all her medication. No longer on Methotrexate.    INTERVAL HISTORY Amber Price is a 43 y.o. 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   Discussed the use of AI scribe software for clinical note transcription with the patient, who gave verbal consent to proceed.   S/p 1 cycle of FOLFOX  She experiences no nausea, vomiting, or diarrhea following chemotherapy. She has stopped OCP   She is prescribed Valtrex  for herpes prophylaxis but has been taking it as needed rather than daily.  She endorsed anxiety due to cancer diagnosis.  No fever or chills.  Denies chest pain, palpitation.  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 RECK 2018 ANNUAL   Sickle cell trait    Vitamin D  deficiency 01/2016   ADD VIT D3 5000 IU DAILY    SURGICAL HISTORY: Past Surgical History:  Procedure Laterality Date   COLONOSCOPY N/A 04/08/2024   Procedure: COLONOSCOPY;  Surgeon: Jinny Carmine, MD;  Location: [facility] ENDOSCOPY;  Service: Endoscopy;  Laterality: N/A;   IR IMAGING GUIDED PORT INSERTION  05/26/2024   NO PAST SURGERIES      SOCIAL HISTORY: Social History    Socioeconomic History   Marital status: Single    Spouse name: Not on file   Number of children: 3   Years of education: 14   Highest education level: Not on file  Occupational History   Occupation: BUSINESS OFFICE SPECIALIST  Tobacco Use   Smoking status: Never   Smokeless tobacco: Never  Vaping Use   Vaping status: Never Used  Substance and Sexual Activity   Alcohol use: Not Currently   Drug use: No   Sexual activity: Yes    Birth control/protection: Pill  Other Topics Concern   Not on file  Social History Narrative   Not on file   Social Drivers of Health   Financial Resource Strain: Low Risk (04/19/2024)   Received from [facility]   Overall Financial Resource Strain (CARDIA)    How hard is it for you to pay for the very basics like food, housing, medical care, and heating?: Not very hard  Recent Concern: Financial Resource Strain - Medium Risk (04/06/2024)   Received from [facility]   Overall Financial Resource Strain (CARDIA)    Difficulty of Paying Living Expenses: Somewhat hard  Food Insecurity: No Food Insecurity (04/19/2024)   Received from [facility]   Hunger Vital Sign    Within the past 12 months, you worried that your food would run out before you got the money to buy more.: Never true    Within the past 12 months, the food you bought just didn't last and you didn't have money to get more.: Never true  Recent Concern: Food Insecurity - Food Insecurity Present (04/12/2024)   Hunger Vital Sign    Worried About Running Out of Food in the Last Year: Sometimes true    Ran Out of Food in the Last Year: Never true  Transportation Needs: No Transportation Needs (04/19/2024)   Received from [facility] - Transportation    Lack of Transportation (Medical): No    Lack of Transportation (Non-Medical): No  Physical Activity: Not on file  Stress: Not on file  Social Connections: Not on file  Intimate Partner Violence: Not At  Risk (04/15/2024)   Humiliation, Afraid, Rape, and Kick questionnaire    Fear of Current or Ex-Partner: No    Emotionally Abused: No    Physically Abused: No    Sexually Abused: No    FAMILY HISTORY: Family History  Problem Relation Age of Onset   Diabetes Mellitus II Mother    Hypertension Mother    Hypercholesterolemia Father    Cancer Paternal Grandmother 82       LUNG   Sickle cell trait Son     ALLERGIES:  is allergic to tape.  MEDICATIONS:  Current Outpatient Medications  Medication Sig Dispense Refill   amLODipine  (NORVASC ) 5 MG tablet Take 1 tablet (5 mg total) by mouth daily. 30 tablet 11  dexamethasone  (DECADRON ) 4 MG tablet Take 2 tablets (8 mg total) by mouth daily. Start the day after chemotherapy for 2 days. Take with food. 30 tablet 1   lidocaine -prilocaine  (EMLA ) cream Apply to affected area once 30 g 3   LORazepam (ATIVAN) 0.5 MG tablet Take 1 tablet (0.5 mg total) by mouth every 12 (twelve) hours as needed for anxiety or sleep (nausea). 60 tablet 0   ondansetron  (ZOFRAN ) 8 MG tablet Take 1 tablet (8 mg total) by mouth every 8 (eight) hours as needed for nausea or vomiting. Start on the third day after chemotherapy. 30 tablet 1   prochlorperazine  (COMPAZINE ) 10 MG tablet Take 1 tablet (10 mg total) by mouth every 6 (six) hours as needed for nausea or vomiting. 30 tablet 1   valACYclovir  (VALTREX ) 500 MG tablet Take 1 tablet (500 mg total) by mouth daily. 90 tablet 3   Cholecalciferol (VITAMIN D3) 1.25 MG (50000 UT) CAPS TAKE 1 CAPSULE BY MOUTH ONE TIME PER WEEK (Patient not taking: Reported on 06/14/2024) 12 capsule 3   folic acid (FOLVITE) 1 MG tablet Take 1 mg by mouth daily. (Patient not taking: Reported on 06/14/2024)     JUNEL  FE 1/20 1-20 MG-MCG tablet TAKE 1 TABLET BY MOUTH EVERY DAY (Patient not taking: Reported on 06/14/2024) 84 tablet 1   rosuvastatin  (CRESTOR ) 20 MG tablet TAKE 1 TABLET BY MOUTH EVERY DAY (Patient not taking: Reported on 06/14/2024) 90 tablet  3   No current facility-administered medications for this visit.   Facility-Administered Medications Ordered in Other Visits  Medication Dose Route Frequency Provider Last Rate Last Admin   0.9 %  sodium chloride  infusion   Intravenous Once Babara Call, MD 999 mL/hr at 06/14/24 0958 New Bag at 06/14/24 0958   LORazepam (ATIVAN) injection 0.5 mg  0.5 mg Intravenous Once PRN Babara Call, MD        Review of Systems  Constitutional:  Negative for appetite change, chills, fatigue and fever.  HENT:   Negative for hearing loss and voice change.   Eyes:  Negative for eye problems.  Respiratory:  Negative for chest tightness and cough.   Cardiovascular:  Negative for chest pain.  Gastrointestinal:  Negative for abdominal distention, abdominal pain and blood in stool.  Endocrine: Negative for hot flashes.  Genitourinary:  Negative for difficulty urinating and frequency.   Musculoskeletal:  Negative for arthralgias.  Skin:  Negative for itching and rash.  Neurological:  Negative for extremity weakness.  Hematological:  Negative for adenopathy.  Psychiatric/Behavioral:  Positive for sleep disturbance. Negative for confusion. The patient is nervous/anxious.      PHYSICAL EXAMINATION: ECOG PERFORMANCE STATUS: 0 - Asymptomatic  Vitals:   06/14/24 0921  BP: 128/86  Pulse: (!) 122  Resp: 18  Temp: (!) 96 F (35.6 C)  SpO2: 100%   Filed Weights   06/14/24 0921  Weight: 221 lb 11.2 oz (100.6 kg)    Physical Exam Constitutional:      General: She is not in acute distress.    Appearance: She is not diaphoretic.  HENT:     Head: Normocephalic and atraumatic.  Eyes:     General: No scleral icterus. Cardiovascular:     Rate and Rhythm: Regular rhythm. Tachycardia present.  Pulmonary:     Effort: Pulmonary effort is normal. No respiratory distress.  Abdominal:     General: Bowel sounds are normal. There is no distension.     Palpations: Abdomen is soft.  Musculoskeletal:  General:  Normal range of motion.     Cervical back: Normal range of motion and neck supple.  Skin:    Findings: No erythema.  Neurological:     Mental Status: She is alert and oriented to person, place, and time. Mental status is at baseline.     Cranial Nerves: No cranial nerve deficit.     Motor: No abnormal muscle tone.  Psychiatric:        Mood and Affect: Mood and affect normal.      LABORATORY DATA:  I have reviewed the data as listed    Latest Ref Rng & Units 06/14/2024    9:07 AM 06/07/2024    8:44 AM 05/31/2024    8:13 AM  CBC  WBC 4.0 - 10.5 K/uL 3.7  5.5  6.1   Hemoglobin 12.0 - 15.0 g/dL 89.1  89.5  89.6   Hematocrit 36.0 - 46.0 % 33.1  32.3  31.1   Platelets 150 - 400 K/uL 204  223  320       Latest Ref Rng & Units 06/14/2024    9:07 AM 06/07/2024    8:44 AM 05/31/2024    8:13 AM  CMP  Glucose 70 - 99 mg/dL 890  85  96   BUN 6 - 20 mg/dL 9  15  10    Creatinine 0.44 - 1.00 mg/dL 8.98  8.96  9.10   Sodium 135 - 145 mmol/L 138  135  138   Potassium 3.5 - 5.1 mmol/L 3.7  3.8  3.6   Chloride 98 - 111 mmol/L 105  104  106   CO2 22 - 32 mmol/L 23  23  23    Calcium  8.9 - 10.3 mg/dL 8.9  8.7  8.7   Total Protein 6.5 - 8.1 g/dL 7.2  6.9  7.1   Total Bilirubin 0.0 - 1.2 mg/dL 0.6  0.8  0.7   Alkaline Phos 38 - 126 U/L 63  41  47   AST 15 - 41 U/L 28  18  15    ALT 0 - 44 U/L 25  14  11       RADIOGRAPHIC STUDIES: I have personally reviewed the radiological images as listed and agreed with the findings in the report. IR IMAGING GUIDED PORT INSERTION Result Date: 05/26/2024 CLINICAL DATA:  Metastatic colorectal carcinoma of the cecum and need for porta cath for chemo EXAM: IMPLANTED PORT A CATH PLACEMENT WITH ULTRASOUND AND FLUOROSCOPIC GUIDANCE ANESTHESIA/SEDATION: Moderate (conscious) sedation was employed during this procedure. A total of Versed  2.0 mg and Fentanyl  100 mcg was administered intravenously. Moderate Sedation Time: 24 minutes. The patient's level of  consciousness and vital signs were monitored continuously by radiology nursing throughout the procedure under my direct supervision. FLUOROSCOPY: Radiation Exposure Index: 4.1 mGy Kerma PROCEDURE: The procedure, risks, benefits, and alternatives were explained to the patient. Questions regarding the procedure were encouraged and answered. The patient understands and consents to the procedure. A time-out was performed prior to initiating the procedure. Ultrasound was utilized to confirm patency of the right internal jugular vein. An ultrasound image was saved and recorded. The right neck and chest were prepped with chlorhexidine in a sterile fashion, and a sterile drape was applied covering the operative field. Maximum barrier sterile technique with sterile gowns and gloves were used for the procedure. Local anesthesia was provided with 1% lidocaine . After creating a small venotomy incision, a 21 gauge needle was advanced into the right internal jugular vein under direct, real-time  ultrasound guidance. Ultrasound image documentation was performed. After securing guidewire access, an 8 Fr dilator was placed. A J-wire was kinked to measure appropriate catheter length. A subcutaneous port pocket was then created along the upper chest wall utilizing sharp and blunt dissection. Portable cautery was utilized. The pocket was irrigated with sterile saline. A single lumen power injectable port was chosen for placement. The 8 Fr catheter was tunneled from the port pocket site to the venotomy incision. The port was placed in the pocket. External catheter was trimmed to appropriate length based on guidewire measurement. At the venotomy, an 8 Fr peel-away sheath was placed over a guidewire. The catheter was then placed through the sheath and the sheath removed. Final catheter positioning was confirmed and documented with a fluoroscopic spot image. The port was accessed with a needle and aspirated and flushed with heparinized  saline. The access needle was removed. The venotomy and port pocket incisions were closed with subcutaneous 3-0 Monocryl and subcuticular 4-0 Vicryl. Dermabond was applied to both incisions. COMPLICATIONS: COMPLICATIONS None FINDINGS: After catheter placement, the tip lies at the cavo-atrial junction. The catheter aspirates normally and is ready for immediate use. IMPRESSION: Placement of single lumen port a cath via right internal jugular vein. The catheter tip lies at the cavo-atrial junction. A power injectable port a cath was placed and is ready for immediate use. Electronically Signed   By: Marcey Moan M.D.   On: 05/26/2024 11:23

## 2024-06-14 NOTE — Assessment & Plan Note (Signed)
 Recommend Lorazepam 0.5mg  Q12 hours PRN anxiety, nausea sleep.  Rationale and side effects were reviewed with patient. ' Rx was sent to pharmacy.

## 2024-06-14 NOTE — Assessment & Plan Note (Signed)
Chemotherapy treatment as listed above. 

## 2024-06-14 NOTE — Patient Instructions (Signed)
 [facility] - A DEPT OF [facility]  Discharge Instructions: Thank you for choosing [facility] to provide your oncology and hematology care.  If you have a lab appointment with the Cancer Center, please go directly to the Cancer Center and check in at the registration area.  Wear comfortable clothing and clothing appropriate for easy access to any Portacath or PICC line.   We strive to give you quality time with your provider. You may need to reschedule your appointment if you arrive late (15 or more minutes).  Arriving late affects you and other patients whose appointments are after yours.  Also, if you miss three or more appointments without notifying the office, you may be dismissed from the clinic at the provider's discretion.      For prescription refill requests, have your pharmacy contact our office and allow 72 hours for refills to be completed.    Today you received the following chemotherapy and/or immunotherapy agents OXALIPLATIN , 5FU, LEUCOVORIN       To help prevent nausea and vomiting after your treatment, we encourage you to take your nausea medication as directed.  BELOW ARE SYMPTOMS THAT SHOULD BE REPORTED IMMEDIATELY: *FEVER GREATER THAN 100.4 F (38 C) OR HIGHER *CHILLS OR SWEATING *NAUSEA AND VOMITING THAT IS NOT CONTROLLED WITH YOUR NAUSEA MEDICATION *UNUSUAL SHORTNESS OF BREATH *UNUSUAL BRUISING OR BLEEDING *URINARY PROBLEMS (pain or burning when urinating, or frequent urination) *BOWEL PROBLEMS (unusual diarrhea, constipation, pain near the anus) TENDERNESS IN MOUTH AND THROAT WITH OR WITHOUT PRESENCE OF ULCERS (sore throat, sores in mouth, or a toothache) UNUSUAL RASH, SWELLING OR PAIN  UNUSUAL VAGINAL DISCHARGE OR ITCHING   Items with * indicate a potential emergency and should be followed up as soon as possible or go to the Emergency Department if any problems should occur.  Please show the CHEMOTHERAPY ALERT CARD  or IMMUNOTHERAPY ALERT CARD at check-in to the Emergency Department and triage nurse.  Should you have questions after your visit or need to cancel or reschedule your appointment, please contact [facility] - A DEPT OF [facility]  [phone] and follow the prompts.  Office hours are 8:00 a.m. to 4:30 p.m. Monday - Friday. Please note that voicemails left after 4:00 p.m. may not be returned until the following business day.  We are closed weekends and major holidays. You have access to a nurse at all times for urgent questions. Please call the main number to the clinic [phone] and follow the prompts.  For any non-urgent questions, you may also contact your provider using MyChart. We now offer e-Visits for anyone 30 and older to request care online for non-urgent symptoms. For details visit [email or web address].   Also download the MyChart app! Go to the app store, search MyChart, open the app, select [facility], and log in with your MyChart username and password.   Oxaliplatin  Injection What is this medication? OXALIPLATIN  (ox AL i PLA tin) treats colorectal cancer. It works by slowing down the growth of cancer cells. This medicine may be used for other purposes; ask your health care provider or pharmacist if you have questions. COMMON BRAND NAME(S): Eloxatin  What should I tell my care team before I take this medication? They need to know if you have any of these conditions: Heart disease History of irregular heartbeat or rhythm Liver disease Low blood cell levels (white cells, red cells, and platelets) Lung or breathing disease,  such as asthma Take medications that treat or prevent blood clots Tingling of the fingers, toes, or other nerve disorder An unusual or allergic reaction to oxaliplatin , other medications, foods, dyes, or preservatives If you or your partner are pregnant or trying to get pregnant Breast-feeding How should I use this  medication? This medication is injected into a vein. It is given by your care team in a hospital or clinic setting. Talk to your care team about the use of this medication in children. Special care may be needed. Overdosage: If you think you have taken too much of this medicine contact a poison control center or emergency room at once. NOTE: This medicine is only for you. Do not share this medicine with others. What if I miss a dose? Keep appointments for follow-up doses. It is important not to miss a dose. Call your care team if you are unable to keep an appointment. What may interact with this medication? Do not take this medication with any of the following: Cisapride Dronedarone Pimozide Thioridazine This medication may also interact with the following: Aspirin and aspirin-like medications Certain medications that treat or prevent blood clots, such as warfarin, apixaban, dabigatran, and rivaroxaban Cisplatin Cyclosporine Diuretics Medications for infection, such as acyclovir, adefovir, amphotericin B, bacitracin, cidofovir, foscarnet, ganciclovir, gentamicin, pentamidine, vancomycin NSAIDs, medications for pain and inflammation, such as ibuprofen  or naproxen Other medications that cause heart rhythm changes Pamidronate Zoledronic acid This list may not describe all possible interactions. Give your health care provider a list of all the medicines, herbs, non-prescription drugs, or dietary supplements you use. Also tell them if you smoke, drink alcohol, or use illegal drugs. Some items may interact with your medicine. What should I watch for while using this medication? Your condition will be monitored carefully while you are receiving this medication. You may need blood work while taking this medication. This medication may make you feel generally unwell. This is not uncommon as chemotherapy can affect healthy cells as well as cancer cells. Report any side effects. Continue your course  of treatment even though you feel ill unless your care team tells you to stop. This medication may increase your risk of getting an infection. Call your care team for advice if you get a fever, chills, sore throat, or other symptoms of a cold or flu. Do not treat yourself. Try to avoid being around people who are sick. Avoid taking medications that contain aspirin, acetaminophen , ibuprofen , naproxen, or ketoprofen unless instructed by your care team. These medications may hide a fever. Be careful brushing or flossing your teeth or using a toothpick because you may get an infection or bleed more easily. If you have any dental work done, tell your dentist you are receiving this medication. This medication can make you more sensitive to cold. Do not drink cold drinks or use ice. Cover exposed skin before coming in contact with cold temperatures or cold objects. When out in cold weather wear warm clothing and cover your mouth and nose to warm the air that goes into your lungs. Tell your care team if you get sensitive to the cold. Talk to your care team if you or your partner are pregnant or think either of you might be pregnant. This medication can cause serious birth defects if taken during pregnancy and for 9 months after the last dose. A negative pregnancy test is required before starting this medication. A reliable form of contraception is recommended while taking this medication and for 9 months after  the last dose. Talk to your care team about effective forms of contraception. Do not father a child while taking this medication and for 6 months after the last dose. Use a condom while having sex during this time period. Do not breastfeed while taking this medication and for 3 months after the last dose. This medication may cause infertility. Talk to your care team if you are concerned about your fertility. What side effects may I notice from receiving this medication? Side effects that you should report to  your care team as soon as possible: Allergic reactions--skin rash, itching, hives, swelling of the face, lips, tongue, or throat Bleeding--bloody or black, tar-like stools, vomiting blood or brown material that looks like coffee grounds, red or dark brown urine, small red or purple spots on skin, unusual bruising or bleeding Dry cough, shortness of breath or trouble breathing Heart rhythm changes--fast or irregular heartbeat, dizziness, feeling faint or lightheaded, chest pain, trouble breathing Infection--fever, chills, cough, sore throat, wounds that don't heal, pain or trouble when passing urine, general feeling of discomfort or being unwell Liver injury--right upper belly pain, loss of appetite, nausea, light-colored stool, dark yellow or brown urine, yellowing skin or eyes, unusual weakness or fatigue Low red blood cell level--unusual weakness or fatigue, dizziness, headache, trouble breathing Muscle injury--unusual weakness or fatigue, muscle pain, dark yellow or brown urine, decrease in amount of urine Pain, tingling, or numbness in the hands or feet Sudden and severe headache, confusion, change in vision, seizures, which may be signs of posterior reversible encephalopathy syndrome (PRES) Unusual bruising or bleeding Side effects that usually do not require medical attention (report to your care team if they continue or are bothersome): Diarrhea Nausea Pain, redness, or swelling with sores inside the mouth or throat Unusual weakness or fatigue Vomiting This list may not describe all possible side effects. Call your doctor for medical advice about side effects. You may report side effects to FDA at 1-800-FDA-1088. Where should I keep my medication? This medication is given in a hospital or clinic. It will not be stored at home. NOTE: This sheet is a summary. It may not cover all possible information. If you have questions about this medicine, talk to your doctor, pharmacist, or health care  provider.  2024 Elsevier/Gold Standard (2023-06-12 00:00:00) Fluorouracil  Injection What is this medication? FLUOROURACIL  (flure oh YOOR a sil) treats some types of cancer. It works by slowing down the growth of cancer cells. This medicine may be used for other purposes; ask your health care provider or pharmacist if you have questions. COMMON BRAND NAME(S): Adrucil  What should I tell my care team before I take this medication? They need to know if you have any of these conditions: Blood disorders Dihydropyrimidine dehydrogenase (DPD) deficiency Infection, such as chickenpox, cold sores, herpes Kidney disease Liver disease Poor nutrition Recent or ongoing radiation therapy An unusual or allergic reaction to fluorouracil , other medications, foods, dyes, or preservatives If you or your partner are pregnant or trying to get pregnant Breast-feeding How should I use this medication? This medication is injected into a vein. It is administered by your care team in a hospital or clinic setting. Talk to your care team about the use of this medication in children. Special care may be needed. Overdosage: If you think you have taken too much of this medicine contact a poison control center or emergency room at once. NOTE: This medicine is only for you. Do not share this medicine with others. What if I  miss a dose? Keep appointments for follow-up doses. It is important not to miss your dose. Call your care team if you are unable to keep an appointment. What may interact with this medication? Do not take this medication with any of the following: Live virus vaccines This medication may also interact with the following: Medications that treat or prevent blood clots, such as warfarin, enoxaparin , dalteparin This list may not describe all possible interactions. Give your health care provider a list of all the medicines, herbs, non-prescription drugs, or dietary supplements you use. Also tell them if  you smoke, drink alcohol, or use illegal drugs. Some items may interact with your medicine. What should I watch for while using this medication? Your condition will be monitored carefully while you are receiving this medication. This medication may make you feel generally unwell. This is not uncommon as chemotherapy can affect healthy cells as well as cancer cells. Report any side effects. Continue your course of treatment even though you feel ill unless your care team tells you to stop. In some cases, you may be given additional medications to help with side effects. Follow all directions for their use. This medication may increase your risk of getting an infection. Call your care team for advice if you get a fever, chills, sore throat, or other symptoms of a cold or flu. Do not treat yourself. Try to avoid being around people who are sick. This medication may increase your risk to bruise or bleed. Call your care team if you notice any unusual bleeding. Be careful brushing or flossing your teeth or using a toothpick because you may get an infection or bleed more easily. If you have any dental work done, tell your dentist you are receiving this medication. Avoid taking medications that contain aspirin, acetaminophen , ibuprofen , naproxen, or ketoprofen unless instructed by your care team. These medications may hide a fever. Do not treat diarrhea with over the counter products. Contact your care team if you have diarrhea that lasts more than 2 days or if it is severe and watery. This medication can make you more sensitive to the sun. Keep out of the sun. If you cannot avoid being in the sun, wear protective clothing and sunscreen. Do not use sun lamps, tanning beds, or tanning booths. Talk to your care team if you or your partner wish to become pregnant or think you might be pregnant. This medication can cause serious birth defects if taken during pregnancy and for 3 months after the last dose. A reliable  form of contraception is recommended while taking this medication and for 3 months after the last dose. Talk to your care team about effective forms of contraception. Do not father a child while taking this medication and for 3 months after the last dose. Use a condom while having sex during this time period. Do not breastfeed while taking this medication. This medication may cause infertility. Talk to your care team if you are concerned about your fertility. What side effects may I notice from receiving this medication? Side effects that you should report to your care team as soon as possible: Allergic reactions--skin rash, itching, hives, swelling of the face, lips, tongue, or throat Heart attack--pain or tightness in the chest, shoulders, arms, or jaw, nausea, shortness of breath, cold or clammy skin, feeling faint or lightheaded Heart failure--shortness of breath, swelling of the ankles, feet, or hands, sudden weight gain, unusual weakness or fatigue Heart rhythm changes--fast or irregular heartbeat, dizziness, feeling faint or  lightheaded, chest pain, trouble breathing High ammonia  level--unusual weakness or fatigue, confusion, loss of appetite, nausea, vomiting, seizures Infection--fever, chills, cough, sore throat, wounds that don't heal, pain or trouble when passing urine, general feeling of discomfort or being unwell Low red blood cell level--unusual weakness or fatigue, dizziness, headache, trouble breathing Pain, tingling, or numbness in the hands or feet, muscle weakness, change in vision, confusion or trouble speaking, loss of balance or coordination, trouble walking, seizures Redness, swelling, and blistering of the skin over hands and feet Severe or prolonged diarrhea Unusual bruising or bleeding Side effects that usually do not require medical attention (report to your care team if they continue or are bothersome): Dry skin Headache Increased tears Nausea Pain, redness, or  swelling with sores inside the mouth or throat Sensitivity to light Vomiting This list may not describe all possible side effects. Call your doctor for medical advice about side effects. You may report side effects to FDA at 1-800-FDA-1088. Where should I keep my medication? This medication is given in a hospital or clinic. It will not be stored at home. NOTE: This sheet is a summary. It may not cover all possible information. If you have questions about this medicine, talk to your doctor, pharmacist, or health care provider.  2024 Elsevier/Gold Standard (2021-11-05 00:00:00) Leucovorin  Injection What is this medication? LEUCOVORIN  (loo koe VOR in) prevents side effects from certain medications, such as methotrexate. It works by increasing folate levels. This helps protect healthy cells in your body. It may also be used to treat anemia caused by low levels of folate. It can also be used with fluorouracil , a type of chemotherapy, to treat colorectal cancer. It works by increasing the effects of fluorouracil  in the body. This medicine may be used for other purposes; ask your health care provider or pharmacist if you have questions. What should I tell my care team before I take this medication? They need to know if you have any of these conditions: Anemia from low levels of vitamin B12 in the blood An unusual or allergic reaction to leucovorin , folic acid, other medications, foods, dyes, or preservatives Pregnant or trying to get pregnant Breastfeeding How should I use this medication? This medication is injected into a vein or a muscle. It is given by your care team in a hospital or clinic setting. Talk to your care team about the use of this medication in children. Special care may be needed. Overdosage: If you think you have taken too much of this medicine contact a poison control center or emergency room at once. NOTE: This medicine is only for you. Do not share this medicine with  others. What if I miss a dose? Keep appointments for follow-up doses. It is important not to miss your dose. Call your care team if you are unable to keep an appointment. What may interact with this medication? Capecitabine Fluorouracil  Phenobarbital Phenytoin Primidone Trimethoprim;sulfamethoxazole This list may not describe all possible interactions. Give your health care provider a list of all the medicines, herbs, non-prescription drugs, or dietary supplements you use. Also tell them if you smoke, drink alcohol, or use illegal drugs. Some items may interact with your medicine. What should I watch for while using this medication? Your condition will be monitored carefully while you are receiving this medication. This medication may increase the side effects of 5-fluorouracil . Tell your care team if you have diarrhea or mouth sores that do not get better or that get worse. What side effects may I notice  from receiving this medication? Side effects that you should report to your care team as soon as possible: Allergic reactions--skin rash, itching, hives, swelling of the face, lips, tongue, or throat This list may not describe all possible side effects. Call your doctor for medical advice about side effects. You may report side effects to FDA at 1-800-FDA-1088. Where should I keep my medication? This medication is given in a hospital or clinic. It will not be stored at home. NOTE: This sheet is a summary. It may not cover all possible information. If you have questions about this medicine, talk to your doctor, pharmacist, or health care provider.  2024 Elsevier/Gold Standard (2021-12-03 00:00:00)

## 2024-06-14 NOTE — Assessment & Plan Note (Signed)
 Possibly due to anxiety and dehydration.  IVF hydration today, Ativan  0.5mg  x 1 prior to chemo.  Consider cardiology evaluation if HR remains elevated.

## 2024-06-14 NOTE — Assessment & Plan Note (Signed)
 Lab Results  Component Value Date   HGB 10.8 (L) 06/14/2024   TIBC 377 04/08/2024   IRONPCTSAT 5 (L) 04/08/2024   FERRITIN 63 04/08/2024     she prefers to take oral iron supplementation. Hb remains stable, Monitor counts.

## 2024-06-14 NOTE — Assessment & Plan Note (Signed)
 Primary cecal adenocarcinoma, ileocolic mesenteric adenopathy and liver lesions. Baseline CEA was elevated at 32.8. pMMR  2nd opinion recommendation at [facility] was reviewed.  Recommend liver lesion biopsy patient declined.  Recommend molecular profiling- NGS testing. She has no desire of fertility preservation.  Recommend genetic testing-patient reports that she has a genetic appointment at [facility] in Feb 2026  Labs are reviewed and discussed with patient. Proceed with cycle 2 FOLFOX with day 3 pump DC   Encourage oral hydration. Cr is slightly elevated eGFR >60

## 2024-06-16 ENCOUNTER — Inpatient Hospital Stay

## 2024-06-16 VITALS — BP 127/93 | HR 82 | Temp 97.7°F | Resp 18

## 2024-06-16 DIAGNOSIS — C180 Malignant neoplasm of cecum: Secondary | ICD-10-CM

## 2024-06-18 ENCOUNTER — Encounter: Payer: Self-pay | Admitting: Oncology

## 2024-06-20 ENCOUNTER — Other Ambulatory Visit: Payer: Self-pay

## 2024-06-20 MED ORDER — STERILE WATER FOR INJECTION IJ SOLN
5.0000 mL | Freq: Four times a day (QID) | OROMUCOSAL | 1 refills | Status: DC | PRN
  Filled 2024-06-20: qty 480, 12d supply, fill #0

## 2024-06-23 NOTE — Addendum Note (Signed)
 Encounter addended by: Janice Lynwood BROCKS on: 06/23/2024 9:37 AM  Actions taken: Imaging Exam ended

## 2024-06-28 ENCOUNTER — Inpatient Hospital Stay: Admitting: Oncology

## 2024-06-28 ENCOUNTER — Telehealth: Payer: Self-pay

## 2024-06-28 ENCOUNTER — Inpatient Hospital Stay

## 2024-06-28 ENCOUNTER — Encounter: Payer: Self-pay | Admitting: Oncology

## 2024-06-28 VITALS — BP 131/86 | HR 100 | Temp 97.8°F | Resp 18 | Wt 215.9 lb

## 2024-06-28 DIAGNOSIS — C180 Malignant neoplasm of cecum: Secondary | ICD-10-CM | POA: Diagnosis not present

## 2024-06-28 DIAGNOSIS — Z5111 Encounter for antineoplastic chemotherapy: Secondary | ICD-10-CM | POA: Diagnosis not present

## 2024-06-28 DIAGNOSIS — R000 Tachycardia, unspecified: Secondary | ICD-10-CM

## 2024-06-28 DIAGNOSIS — F411 Generalized anxiety disorder: Secondary | ICD-10-CM | POA: Diagnosis not present

## 2024-06-28 DIAGNOSIS — D702 Other drug-induced agranulocytosis: Secondary | ICD-10-CM

## 2024-06-28 DIAGNOSIS — D509 Iron deficiency anemia, unspecified: Secondary | ICD-10-CM

## 2024-06-28 DIAGNOSIS — C801 Malignant (primary) neoplasm, unspecified: Secondary | ICD-10-CM

## 2024-06-28 DIAGNOSIS — D701 Agranulocytosis secondary to cancer chemotherapy: Secondary | ICD-10-CM | POA: Insufficient documentation

## 2024-06-28 DIAGNOSIS — D709 Neutropenia, unspecified: Secondary | ICD-10-CM | POA: Insufficient documentation

## 2024-06-28 DIAGNOSIS — C787 Secondary malignant neoplasm of liver and intrahepatic bile duct: Secondary | ICD-10-CM

## 2024-06-28 LAB — CBC WITH DIFFERENTIAL (CANCER CENTER ONLY)
Abs Immature Granulocytes: 0 K/uL (ref 0.00–0.07)
Basophils Absolute: 0 K/uL (ref 0.0–0.1)
Basophils Relative: 0 %
Eosinophils Absolute: 0 K/uL (ref 0.0–0.5)
Eosinophils Relative: 1 %
HCT: 29.4 % — ABNORMAL LOW (ref 36.0–46.0)
Hemoglobin: 9.6 g/dL — ABNORMAL LOW (ref 12.0–15.0)
Immature Granulocytes: 0 %
Lymphocytes Relative: 61 %
Lymphs Abs: 1.1 K/uL (ref 0.7–4.0)
MCH: 24.1 pg — ABNORMAL LOW (ref 26.0–34.0)
MCHC: 32.7 g/dL (ref 30.0–36.0)
MCV: 73.9 fL — ABNORMAL LOW (ref 80.0–100.0)
Monocytes Absolute: 0.3 K/uL (ref 0.1–1.0)
Monocytes Relative: 16 %
Neutro Abs: 0.4 K/uL — CL (ref 1.7–7.7)
Neutrophils Relative %: 22 %
Platelet Count: 110 K/uL — ABNORMAL LOW (ref 150–400)
RBC: 3.98 MIL/uL (ref 3.87–5.11)
RDW: 15.4 % (ref 11.5–15.5)
WBC Count: 1.8 K/uL — ABNORMAL LOW (ref 4.0–10.5)
nRBC: 0 % (ref 0.0–0.2)

## 2024-06-28 LAB — CMP (CANCER CENTER ONLY)
ALT: 21 U/L (ref 0–44)
AST: 23 U/L (ref 15–41)
Albumin: 3.8 g/dL (ref 3.5–5.0)
Alkaline Phosphatase: 70 U/L (ref 38–126)
Anion gap: 12 (ref 5–15)
BUN: 6 mg/dL (ref 6–20)
CO2: 23 mmol/L (ref 22–32)
Calcium: 9.1 mg/dL (ref 8.9–10.3)
Chloride: 105 mmol/L (ref 98–111)
Creatinine: 0.94 mg/dL (ref 0.44–1.00)
GFR, Estimated: >60 mL/min (ref >60)
Glucose, Bld: 96 mg/dL (ref 70–99)
Potassium: 3.7 mmol/L (ref 3.5–5.1)
Sodium: 140 mmol/L (ref 135–145)
Total Bilirubin: 0.6 mg/dL (ref 0.0–1.2)
Total Protein: 6.7 g/dL (ref 6.5–8.1)

## 2024-06-28 LAB — PREGNANCY, URINE: Preg Test, Ur: NEGATIVE

## 2024-06-28 NOTE — Assessment & Plan Note (Addendum)
 She appears to be hydrated well.  Denies feeling anxious.  I will obtain a CT chest angiogram to rule out pulmonary embolism being underlying cause of tachycardia. Consider cardiology evaluation if HR remains elevated.

## 2024-06-28 NOTE — Assessment & Plan Note (Signed)
 Primary cecal adenocarcinoma, ileocolic mesenteric adenopathy and liver lesions. Baseline CEA was elevated at 32.8. pMMR  2nd opinion recommendation at [facility] was reviewed.  Recommend liver lesion biopsy patient declined.  Recommend molecular profiling- NGS testing. She has no desire of fertility preservation.  Recommend genetic testing-patient reports that she has a genetic appointment at [facility] in Feb 2026  Labs are reviewed and discussed with patient. Hold off cycle 3 FOLFOX with day 3 pump DC due to neutropenia.  Encourage oral hydration. Cr is slightly elevated eGFR >60

## 2024-06-28 NOTE — Assessment & Plan Note (Signed)
 Lab Results  Component Value Date   HGB 9.6 (L) 06/28/2024   TIBC 377 04/08/2024   IRONPCTSAT 5 (L) 04/08/2024   FERRITIN 63 04/08/2024     she prefers to take oral iron supplementation. Hb remains stable, Monitor counts.

## 2024-06-28 NOTE — Progress Notes (Signed)
 Hematology/Oncology Progress note [phone] [phone]     CHIEF COMPLAINTS/PURPOSE OF CONSULTATION:  Cecal adenocarcinoma liver lesions   ASSESSMENT & PLAN:   Cancer Staging  Primary cancer of cecum [facility]) Staging form: Colon and Rectum, AJCC 8th Edition - Clinical stage from 04/15/2024: cT2, cN1, cM1 - Signed by Babara Call, MD on 05/31/2024   Primary cancer of cecum [facility]) Primary cecal adenocarcinoma, ileocolic mesenteric adenopathy and liver lesions. Baseline CEA was elevated at 32.8. pMMR  2nd opinion recommendation at [facility] was reviewed.  Recommend liver lesion biopsy patient declined.  Recommend molecular profiling- NGS testing. She has no desire of fertility preservation.  Recommend genetic testing-patient reports that she has a genetic appointment at [facility] in Feb 2026  Labs are reviewed and discussed with patient. Hold off cycle 3 FOLFOX with day 3 pump DC due to neutropenia.  Encourage oral hydration. Cr is slightly elevated eGFR >60   Anxiety associated with cancer diagnosis (HCC) Lorazepam  0.5mg  Q12 hours PRN anxiety, nausea sleep.  She currently does not use often.   Iron deficiency anemia Lab Results  Component Value Date   HGB 9.6 (L) 06/28/2024   TIBC 377 04/08/2024   IRONPCTSAT 5 (L) 04/08/2024   FERRITIN 63 04/08/2024     she prefers to take oral iron supplementation. Hb remains stable, Monitor counts.   Metastasis to liver [facility]) Previously she declined liver lesion biopsy. She will repeat imaging at [facility] in the future for evaluation of treatment response.   Tachycardia She appears to be hydrated well.  Denies feeling anxious.  I will obtain a CT chest angiogram to rule out pulmonary embolism being underlying cause of tachycardia. Consider cardiology evaluation if HR remains elevated.   Chemotherapy induced neutropenia She will get short acting G-CSF daily for 3 days. Rationale and potential side effects reviewed and discussed  with patient.  She will take Claritin 10 mg daily for 4 days.  Will add prophylactic long-acting G-CSF for future cycles of treatment.   Orders Placed This Encounter  Procedures   CT Angio Chest Pulmonary Embolism (PE) W or WO Contrast    Standing Status:   Future    Expected Date:   06/30/2024    Expiration Date:   06/28/2025    If indicated for the ordered procedure, I authorize the administration of contrast media per Radiology protocol:   Yes    Does the patient have a contrast media/X-ray dye allergy?:   No    Is patient pregnant?:   No    Preferred imaging location?:   [facility]   Follow-up per LOS-next visit 07/13/2024 per patient's preference. All questions were answered. The patient knows to call the clinic with any problems, questions or concerns.  Call Babara, MD, PhD [facility] Hematology Oncology 06/28/2024    HISTORY OF PRESENTING ILLNESS:  Amber Price 43 y.o. female presents to establish care for stage IV cecal adenocarcinoma I have reviewed her chart and materials related to her cancer extensively and collaborated history with the patient. Summary of oncologic history is as follows: Oncology History  Primary cancer of cecum (HCC)  04/06/2024 Imaging   CT abdomen pelvis with contrast showed 1. Asymmetric wall thickening and enhancement in the cecum, measuring 4.3 cm in craniocaudal dimension, and involving the appendiceal orifice (axial 50, sagittal 61), worrisome for underlying colonic neoplasm. Likely causing obstruction of the appendix, which is fluid-filled and dilated appendix, suggesting acute appendicitis. Surgical consultation recommended. 2. A couple of mildly prominent right ileocolic  chain lymph nodes are present measuring up to 7 mm (axial 50-52). These could be reactive or metastatic. 3. There are 3 hypodense lesions in both lobes of the liver, measuring up to 1.8 cm, likely metastatic disease.     04/06/2024 Imaging   CT chest  angiogram No evidence of pulm embolism or other acute intrathoracic process.   04/07/2024 Imaging   MRI of liver with and without contrast showed 1. Multiple bilateral liver lesions. 2 left hepatic lobe lesions are highly suspicious for metastasis. 1 right liver lobe lesion is indeterminate but favored to represent a hemangioma. 2 smaller right liver lobe lesions are favored to be benign, likely hemangiomas. Consider sampling of the segment 2 lesion versus further evaluation with PET. 2.  Cecal mass with adjacent adenopathy, suspicious for nodal metastasis.  04/08/2024, colonoscopy showed fungating partially obstructing large mass in the cecum. Biopsy pathology showed invasive moderately differentiated adenocarcinoma. pMMR    04/15/2024 Initial Diagnosis   Primary cancer of cecum   Patient presented to emergency room for evaluation of UTI symptoms.  She was found to have sepsis secondary to E. coli bacteremia, acute UTI.  Urine culture grew pansensitive E. coli.  Patient was treated with IV antibiotics and discharged on oral Levaquin .  CT obtained during hospitalization showed Asymmetric wall thickening and enhancement in the cecum , measuring 4.3 cm worrisome for underlying colonic neoplasm.  There are 3 hypodense liver lesions in both lobes of the liver.  Mildly prominent right ileocolic chain lymph nodes. MRI liver showed multiple bilateral liver lesions.    04/08/2024 colonoscopy showed Likely cecal primary with hepatic metastases. No other sites of metabolically active disease are seen. The prominent mesenteric nodes are visible on noncontrast CT but are not significantly avid above background.   Pathology showed invasive moderately differentiated adenocarcinoma.  pMMR Tempus liquid biopsy showed KRAS pG12V    04/15/2024 Cancer Staging   Staging form: Colon and Rectum, AJCC 8th Edition - Clinical stage from 04/15/2024: cT2, cN1, cM1 - Signed by Babara Call, MD on 05/31/2024 Stage prefix:  Initial diagnosis Total positive nodes: 1   04/28/2024 Imaging   PET scan showed Liver: Lesions described in the liver in segments 2 and 4B (SUV max 14.1, CT 129 and 7.3, CT 140). The other lesion in the anterior right hepatic lobe is not metabolically active. The lesions described as likely hemangiomas do not show uptake above background.   Likely cecal primary with hepatic metastases. No other sites of metabolically active disease are seen. The prominent mesenteric nodes are visible on noncontrast CT but are not significantly avid above background.      05/31/2024 -  Chemotherapy   Patient is on Treatment Plan : COLORECTAL FOLFOX q14d x 3 months      Patient denies family history of colon cancer.  Paternal grandmother had lung cancer. She denies any rectal bleeding, melena or change of bowel habits.  Patient is on methotrexate for rheumatoid arthritis. Denies unintentional weight loss, fever, night sweats.  Patient presents to discuss management plan.  Accompanied by her colleague.  She was seen by [facility] Dr. Jia. PET scan was done. She was recommended for liver lesion biopsy and she declined.  She reports having stopped all her medication. No longer on Methotrexate.   She is prescribed Valtrex  for herpes prophylaxis but has been taking it as needed rather than daily.  INTERVAL HISTORY Amber Price is a 43 y.o. 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   Discussed the use of AI scribe software for clinical note transcription with the patient, who gave verbal consent to proceed.   S/p 2 cycles of FOLFOX  She experiences no nausea, vomiting, or diarrhea following chemotherapy.  Patient reports feeling more tired. No fever or chills.  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 RECK 2018 ANNUAL   Sickle cell trait    Vitamin D  deficiency 01/2016   ADD VIT D3 5000 IU DAILY    SURGICAL HISTORY: Past Surgical History:  Procedure Laterality Date   COLONOSCOPY N/A 04/08/2024   Procedure: COLONOSCOPY;  Surgeon: Jinny Carmine, MD;  Location: [facility] ENDOSCOPY;  Service: Endoscopy;  Laterality: N/A;   IR IMAGING GUIDED PORT INSERTION  05/26/2024   NO PAST SURGERIES      SOCIAL HISTORY: Social History   Socioeconomic History   Marital status: Single    Spouse name: Not on file   Number of children: 3   Years of education: 14   Highest education level: Not on file  Occupational History   Occupation: BUSINESS OFFICE SPECIALIST  Tobacco Use   Smoking status: Never   Smokeless tobacco: Never  Vaping Use   Vaping status: Never Used  Substance and Sexual Activity   Alcohol use: Not Currently   Drug use: No   Sexual activity: Yes    Birth control/protection: Pill  Other Topics Concern   Not on file  Social History Narrative   Not on file   Social Drivers of Health   Tobacco Use: Low Risk (06/28/2024)   Patient History    Smoking Tobacco Use: Never    Smokeless Tobacco Use: Never    Passive Exposure: Not on file  Financial Resource Strain: Low Risk (04/19/2024)   Received from [facility]   Overall Financial Resource Strain (CARDIA)    How hard is it for you to pay for the very basics like food, housing, medical care, and heating?: Not very hard  Recent Concern: Financial Resource Strain - Medium Risk (04/06/2024)   Received from [facility]   Overall Financial Resource Strain (CARDIA)    Difficulty of Paying Living Expenses: Somewhat hard  Food Insecurity: No Food Insecurity (04/19/2024)   Received from [facility]   Epic     Within the past 12 months, you worried that your food would run out before you got the money to buy more.: Never true    Within the past 12 months, the food you bought just didn't last and you didn't have money to get more.: Never true  Recent Concern: Food Insecurity - Food Insecurity Present (04/12/2024)   Epic    Worried About Programme Researcher, Broadcasting/film/video in the Last Year: Sometimes true    Ran Out of Food in the Last Year: Never true  Transportation Needs: No Transportation Needs (04/19/2024)   Received from [facility] - Transportation    Lack of Transportation (Medical): No    Lack of Transportation (Non-Medical): No  Physical Activity: Not on file  Stress: Not on file  Social Connections: Not on file  Intimate Partner Violence: Not At Risk (04/15/2024)   Epic    Fear of Current or Ex-Partner: No    Emotionally Abused: No    Physically Abused: No    Sexually Abused: No  Depression (PHQ2-9): Low Risk (06/14/2024)   Depression (PHQ2-9)    PHQ-2 Score: 0  Alcohol Screen: Not on file  Housing: Low Risk (04/15/2024)   Epic    Unable to Pay for Housing in the Last Year: No    Number of Times Moved in the Last Year: 0    Homeless in the Last Year: No  Utilities: Low Risk (04/19/2024)   Received from [facility]   Utilities    Within the past 12 months, have you been unable to get utilities(heat, electricity) when it was really needed?: No  Recent Concern: Utilities - At Risk (04/06/2024)   Received from [facility]   Epic    In the past 12 months has the electric, gas, oil, or water  company threatened to shut off services in your home?: Yes  Health Literacy: Low Risk (04/19/2024)   Received from [facility] Literacy    How often do you need to have someone help you when you read instructions, pamphlets, or other written material from your doctor or pharmacy?: Never    FAMILY HISTORY: Family History  Problem Relation Age of Onset    Diabetes Mellitus II Mother    Hypertension Mother    Hypercholesterolemia Father    Cancer Paternal Grandmother 64       LUNG   Sickle cell trait Son     ALLERGIES:  is allergic to tape.  MEDICATIONS:  Current Outpatient Medications  Medication Sig Dispense Refill   amLODipine  (NORVASC ) 5 MG tablet Take 1 tablet (5 mg total) by mouth daily. 30 tablet 11   Cholecalciferol (VITAMIN D3) 1.25 MG (50000 UT) CAPS TAKE 1 CAPSULE BY MOUTH ONE TIME PER WEEK 12 capsule 3   dexamethasone  (DECADRON ) 4 MG tablet Take 2 tablets (8 mg total) by mouth daily. Start the day after chemotherapy for 2 days. Take with food. 30 tablet 1   folic acid (FOLVITE) 1 MG tablet Take 1 mg by mouth daily.     JUNEL  FE 1/20 1-20 MG-MCG tablet TAKE 1 TABLET BY MOUTH EVERY DAY 84 tablet 1   lidocaine -prilocaine  (EMLA ) cream Apply to affected area once 30 g 3   LORazepam  (ATIVAN ) 0.5 MG tablet Take 1 tablet (0.5 mg total) by mouth every 12 (twelve) hours as needed for anxiety or sleep (nausea). 60 tablet 0   magic mouthwash (multi-ingredient) oral suspension Take 5-10 mLs by mouth 4 (four) times daily as needed for mouth pain. 480 mL 1   ondansetron  (ZOFRAN ) 8 MG tablet Take 1 tablet (8 mg total) by mouth every 8 (eight) hours as needed for nausea or vomiting. Start on the third day after chemotherapy. 30 tablet 1   prochlorperazine  (COMPAZINE ) 10 MG tablet Take 1 tablet (10 mg total) by mouth every 6 (six) hours as needed for nausea or vomiting. 30 tablet 1   rosuvastatin  (CRESTOR ) 20 MG tablet TAKE 1 TABLET BY MOUTH EVERY DAY 90 tablet 3   valACYclovir  (VALTREX ) 500 MG tablet Take 1 tablet (500 mg total) by mouth daily. 90 tablet 3   No current facility-administered medications for this visit.    Review of Systems  Constitutional:  Negative for appetite change, chills, fatigue and fever.  HENT:   Negative for hearing loss and voice change.   Eyes:  Negative for eye problems.  Respiratory:  Negative for chest  tightness and cough.   Cardiovascular:  Negative for chest pain.  Gastrointestinal:  Negative for abdominal distention, abdominal pain and blood in stool.  Endocrine: Negative for hot flashes.  Genitourinary:  Negative for difficulty urinating and frequency.   Musculoskeletal:  Negative for arthralgias.  Skin:  Negative for itching and rash.  Neurological:  Negative for extremity weakness.  Hematological:  Negative for adenopathy.  Psychiatric/Behavioral:  Positive for sleep disturbance. Negative for confusion. The patient is nervous/anxious.      PHYSICAL EXAMINATION: ECOG PERFORMANCE STATUS: 0 - Asymptomatic  Vitals:   06/28/24 0852  BP: 131/86  Pulse: 100  Resp: 18  Temp: 97.8 F (36.6 C)  SpO2: 100%   Filed Weights   06/28/24 0852  Weight: 215 lb 14.4 oz (97.9 kg)    Physical Exam Constitutional:      General: She is not in acute distress.    Appearance: She is not diaphoretic.  HENT:     Head: Normocephalic and atraumatic.  Eyes:     General: No scleral icterus. Cardiovascular:     Rate and Rhythm: Regular rhythm. Tachycardia present.  Pulmonary:     Effort: Pulmonary effort is normal. No respiratory distress.  Abdominal:     General: Bowel sounds are normal. There is no distension.     Palpations: Abdomen is soft.  Musculoskeletal:        General: Normal range of motion.     Cervical back: Normal range of motion and neck supple.  Skin:    Findings: No erythema.  Neurological:     Mental Status: She is alert and oriented to person, place, and time. Mental status is at baseline.     Cranial Nerves: No cranial nerve deficit.     Motor: No abnormal muscle tone.  Psychiatric:        Mood and Affect: Mood and affect normal.      LABORATORY DATA:  I have reviewed the data as listed    Latest Ref Rng & Units 06/28/2024    8:27 AM 06/14/2024    9:07 AM 06/07/2024    8:44 AM  CBC  WBC 4.0 - 10.5 K/uL 1.8  3.7  5.5   Hemoglobin 12.0 - 15.0 g/dL 9.6  89.1   89.5   Hematocrit 36.0 - 46.0 % 29.4  33.1  32.3   Platelets 150 - 400 K/uL 110  204  223       Latest Ref Rng & Units 06/28/2024    8:27 AM 06/14/2024    9:07 AM 06/07/2024    8:44 AM  CMP  Glucose 70 - 99 mg/dL 96  890  85   BUN 6 - 20 mg/dL 6  9  15    Creatinine 0.44 - 1.00 mg/dL 9.05  8.98  8.96   Sodium 135 - 145 mmol/L 140  138  135   Potassium 3.5 - 5.1 mmol/L 3.7  3.7  3.8   Chloride 98 - 111 mmol/L 105  105  104   CO2 22 - 32 mmol/L 23  23  23    Calcium  8.9 - 10.3 mg/dL 9.1  8.9  8.7   Total Protein 6.5 - 8.1 g/dL 6.7  7.2  6.9   Total Bilirubin 0.0 - 1.2 mg/dL 0.6  0.6  0.8   Alkaline Phos 38 - 126 U/L 70  63  41   AST 15 - 41 U/L 23  28  18    ALT 0 - 44 U/L 21  25  14       RADIOGRAPHIC STUDIES: I have personally reviewed the radiological images as listed and agreed with the findings in the report. No results found.

## 2024-06-28 NOTE — Telephone Encounter (Signed)
 Received critical lab result from [place] at [place] lab.   ANC 0.4. Dr. Babara Notified

## 2024-06-28 NOTE — Assessment & Plan Note (Signed)
 Previously she declined liver lesion biopsy. She will repeat imaging at [facility] in the future for evaluation of treatment response.

## 2024-06-28 NOTE — Assessment & Plan Note (Addendum)
 She will get short acting G-CSF daily for 3 days. Rationale and potential side effects reviewed and discussed with patient.  She will take Claritin 10 mg daily for 4 days.  Will add prophylactic long-acting G-CSF for future cycles of treatment.

## 2024-06-28 NOTE — Assessment & Plan Note (Signed)
 Lorazepam  0.5mg  Q12 hours PRN anxiety, nausea sleep.  She currently does not use often.

## 2024-06-28 NOTE — Patient Instructions (Signed)

## 2024-06-28 NOTE — Progress Notes (Signed)
 Pt here for follow up. No new concerns voiced.

## 2024-06-29 ENCOUNTER — Other Ambulatory Visit: Payer: Self-pay

## 2024-06-29 ENCOUNTER — Telehealth: Payer: Self-pay | Admitting: Oncology

## 2024-06-29 ENCOUNTER — Inpatient Hospital Stay

## 2024-06-29 DIAGNOSIS — Z5111 Encounter for antineoplastic chemotherapy: Secondary | ICD-10-CM | POA: Diagnosis not present

## 2024-06-29 DIAGNOSIS — D702 Other drug-induced agranulocytosis: Secondary | ICD-10-CM

## 2024-06-29 MED ORDER — FILGRASTIM-SNDZ 480 MCG/0.8ML IJ SOSY
480.0000 ug | PREFILLED_SYRINGE | Freq: Every day | INTRAMUSCULAR | Status: DC
  Administered 2024-06-29: 10:00:00 480 ug via SUBCUTANEOUS
  Filled 2024-06-29: qty 0.8

## 2024-06-29 NOTE — Telephone Encounter (Signed)
 Called pt and left vm to let her know that CT is still on for tomorrow and that we got auth [facility]

## 2024-06-30 ENCOUNTER — Inpatient Hospital Stay

## 2024-06-30 ENCOUNTER — Observation Stay

## 2024-06-30 ENCOUNTER — Other Ambulatory Visit: Payer: Self-pay

## 2024-06-30 ENCOUNTER — Telehealth: Payer: Self-pay

## 2024-06-30 ENCOUNTER — Ambulatory Visit
Admission: RE | Admit: 2024-06-30 | Discharge: 2024-06-30 | Disposition: A | Source: Ambulatory Visit | Attending: Oncology

## 2024-06-30 ENCOUNTER — Observation Stay
Admission: EM | Admit: 2024-06-30 | Source: Home / Self Care | Attending: Internal Medicine | Admitting: Internal Medicine

## 2024-06-30 DIAGNOSIS — T451X5A Adverse effect of antineoplastic and immunosuppressive drugs, initial encounter: Secondary | ICD-10-CM | POA: Diagnosis present

## 2024-06-30 DIAGNOSIS — I10 Essential (primary) hypertension: Secondary | ICD-10-CM | POA: Diagnosis present

## 2024-06-30 DIAGNOSIS — M05711 Rheumatoid arthritis with rheumatoid factor of right shoulder without organ or systems involvement: Secondary | ICD-10-CM | POA: Diagnosis present

## 2024-06-30 DIAGNOSIS — R000 Tachycardia, unspecified: Secondary | ICD-10-CM | POA: Insufficient documentation

## 2024-06-30 DIAGNOSIS — C787 Secondary malignant neoplasm of liver and intrahepatic bile duct: Secondary | ICD-10-CM | POA: Diagnosis not present

## 2024-06-30 DIAGNOSIS — I2699 Other pulmonary embolism without acute cor pulmonale: Principal | ICD-10-CM | POA: Diagnosis present

## 2024-06-30 DIAGNOSIS — C801 Malignant (primary) neoplasm, unspecified: Secondary | ICD-10-CM | POA: Diagnosis present

## 2024-06-30 DIAGNOSIS — Z5111 Encounter for antineoplastic chemotherapy: Secondary | ICD-10-CM | POA: Diagnosis not present

## 2024-06-30 DIAGNOSIS — M069 Rheumatoid arthritis, unspecified: Secondary | ICD-10-CM | POA: Diagnosis not present

## 2024-06-30 DIAGNOSIS — Z79899 Other long term (current) drug therapy: Secondary | ICD-10-CM | POA: Insufficient documentation

## 2024-06-30 DIAGNOSIS — I2694 Multiple subsegmental pulmonary emboli without acute cor pulmonale: Principal | ICD-10-CM

## 2024-06-30 DIAGNOSIS — D702 Other drug-induced agranulocytosis: Secondary | ICD-10-CM

## 2024-06-30 DIAGNOSIS — F419 Anxiety disorder, unspecified: Secondary | ICD-10-CM | POA: Diagnosis not present

## 2024-06-30 DIAGNOSIS — D010 Carcinoma in situ of colon: Secondary | ICD-10-CM | POA: Insufficient documentation

## 2024-06-30 DIAGNOSIS — R079 Chest pain, unspecified: Secondary | ICD-10-CM | POA: Diagnosis present

## 2024-06-30 DIAGNOSIS — Z30011 Encounter for initial prescription of contraceptive pills: Secondary | ICD-10-CM

## 2024-06-30 DIAGNOSIS — C189 Malignant neoplasm of colon, unspecified: Secondary | ICD-10-CM

## 2024-06-30 LAB — COMPREHENSIVE METABOLIC PANEL WITH GFR
ALT: 25 U/L (ref 0–44)
AST: 31 U/L (ref 15–41)
Albumin: 3.8 g/dL (ref 3.5–5.0)
Alkaline Phosphatase: 85 U/L (ref 38–126)
Anion gap: 15 (ref 5–15)
BUN: 6 mg/dL (ref 6–20)
CO2: 23 mmol/L (ref 22–32)
Calcium: 8.9 mg/dL (ref 8.9–10.3)
Chloride: 101 mmol/L (ref 98–111)
Creatinine, Ser: 1.02 mg/dL — ABNORMAL HIGH (ref 0.44–1.00)
GFR, Estimated: >60 mL/min (ref >60)
Glucose, Bld: 88 mg/dL (ref 70–99)
Potassium: 3.8 mmol/L (ref 3.5–5.1)
Sodium: 139 mmol/L (ref 135–145)
Total Bilirubin: 0.7 mg/dL (ref 0.0–1.2)
Total Protein: 6.7 g/dL (ref 6.5–8.1)

## 2024-06-30 LAB — TROPONIN T, HIGH SENSITIVITY: Troponin T High Sensitivity: <15 ng/L (ref 0–19)

## 2024-06-30 LAB — CBC
HCT: 32.1 % — ABNORMAL LOW (ref 36.0–46.0)
Hemoglobin: 10.6 g/dL — ABNORMAL LOW (ref 12.0–15.0)
MCH: 24.4 pg — ABNORMAL LOW (ref 26.0–34.0)
MCHC: 33 g/dL (ref 30.0–36.0)
MCV: 74 fL — ABNORMAL LOW (ref 80.0–100.0)
Platelets: 161 K/uL (ref 150–400)
RBC: 4.34 MIL/uL (ref 3.87–5.11)
RDW: 16.9 % — ABNORMAL HIGH (ref 11.5–15.5)
WBC: 6.7 K/uL (ref 4.0–10.5)
nRBC: 0.4 % — ABNORMAL HIGH (ref 0.0–0.2)

## 2024-06-30 LAB — HCG, QUANTITATIVE, PREGNANCY: hCG, Beta Chain, Quant, S: <1 m[IU]/mL (ref <5)

## 2024-06-30 LAB — PROTIME-INR
INR: 1 (ref 0.8–1.2)
Prothrombin Time: 13.6 s (ref 11.4–15.2)

## 2024-06-30 LAB — PRO BRAIN NATRIURETIC PEPTIDE: Pro Brain Natriuretic Peptide: 87.6 pg/mL (ref <300.0)

## 2024-06-30 LAB — APTT: aPTT: 26 s (ref 24–36)

## 2024-06-30 MED ORDER — MAGIC MOUTHWASH: 10.0000 mL | Freq: Three times a day (TID) | ORAL | Status: DC | PRN

## 2024-06-30 MED ORDER — ONDANSETRON HCL 4 MG PO TABS: 4.0000 mg | ORAL_TABLET | Freq: Four times a day (QID) | ORAL | Status: DC | PRN

## 2024-06-30 MED ORDER — NORETHIN ACE-ETH ESTRAD-FE 1-20 MG-MCG PO TABS: 1.0000 | ORAL_TABLET | Freq: Every day | ORAL | Status: AC

## 2024-06-30 MED ORDER — AMLODIPINE BESYLATE 5 MG PO TABS
5.0000 mg | ORAL_TABLET | Freq: Every day | ORAL | Status: DC
  Administered 2024-06-30 – 2024-07-01 (×2): 5 mg via ORAL
  Filled 2024-06-30: qty 1

## 2024-06-30 MED ORDER — ONDANSETRON HCL 4 MG/2ML IJ SOLN: 4.0000 mg | Freq: Four times a day (QID) | INTRAMUSCULAR | Status: DC | PRN

## 2024-06-30 MED ORDER — ACETAMINOPHEN 325 MG PO TABS: 650.0000 mg | ORAL_TABLET | Freq: Four times a day (QID) | ORAL | Status: DC | PRN

## 2024-06-30 MED ORDER — LORAZEPAM 0.5 MG PO TABS: 0.5000 mg | ORAL_TABLET | Freq: Two times a day (BID) | ORAL | Status: DC | PRN

## 2024-06-30 MED ORDER — IOHEXOL 350 MG/ML SOLN
75.0000 mL | Freq: Once | INTRAVENOUS | Status: AC | PRN
  Administered 2024-06-30: 16:00:00 75 mL via INTRAVENOUS

## 2024-06-30 MED ORDER — MORPHINE SULFATE (PF) 2 MG/ML IV SOLN: 2.0000 mg | INTRAVENOUS | Status: DC | PRN

## 2024-06-30 MED ORDER — OXYCODONE HCL 5 MG PO TABS: 5.0000 mg | ORAL_TABLET | ORAL | Status: DC | PRN

## 2024-06-30 MED ORDER — FILGRASTIM-SNDZ 480 MCG/0.8ML IJ SOSY
480.0000 ug | PREFILLED_SYRINGE | Freq: Every day | INTRAMUSCULAR | Status: DC
  Administered 2024-06-30: 11:00:00 480 ug via SUBCUTANEOUS
  Filled 2024-06-30: qty 0.8

## 2024-06-30 MED ORDER — HEPARIN (PORCINE) 25000 UT/250ML-% IV SOLN
1400.0000 [IU]/h | INTRAVENOUS | Status: DC
  Administered 2024-06-30: 20:00:00 1400 [IU]/h via INTRAVENOUS
  Filled 2024-06-30 (×2): qty 250

## 2024-06-30 MED ORDER — SODIUM CHLORIDE 0.9 % IV BOLUS
500.0000 mL | Freq: Once | INTRAVENOUS | Status: AC
  Administered 2024-06-30: 19:00:00 500 mL via INTRAVENOUS

## 2024-06-30 MED ORDER — ACETAMINOPHEN 650 MG RE SUPP: 650.0000 mg | Freq: Four times a day (QID) | RECTAL | Status: DC | PRN

## 2024-06-30 MED ORDER — HEPARIN BOLUS VIA INFUSION
5200.0000 [IU] | Freq: Once | INTRAVENOUS | Status: AC
  Administered 2024-06-30: 20:00:00 5200 [IU] via INTRAVENOUS
  Filled 2024-06-30: qty 5200

## 2024-06-30 NOTE — Telephone Encounter (Signed)
 Patient here for day 2 of 3 Zarxio  inj.  Reports hip pain last night that was relieved with Tylenol  but was not able to sleep well due to back spasms from low to mid back that radiated to the chest area.  Has not started Claritin.  Will confirm with MD prior to administering day 2 Zarxio  today

## 2024-06-30 NOTE — Hospital Course (Signed)
 SABRA

## 2024-06-30 NOTE — Addendum Note (Signed)
 Addended by: BABARA CALL on: 06/30/2024 08:21 AM   Modules accepted: Orders

## 2024-06-30 NOTE — Assessment & Plan Note (Addendum)
 Back pain/chest pain/shortness of breath secondary to above Suspect related to cancer diagnosis CTA showed bilateral lower lobe segmental and subsegmental pulmonary emboli as well as developing peripheral left lower lobe pulmonary infarctions without heart strain Patient symptomatic for back pain radiating to the chest.  Persistently tachycardic Continue heparin  infusion Pain control Continuous cardiac monitoring Supplemental oxygen if needed Will get lower extremity venous ultrasounds

## 2024-06-30 NOTE — Assessment & Plan Note (Signed)
 Chemotherapy induced neutropenia(FOLFOX) Anxiety related to cancer diagnosis Developed neutropenia s/p cycle 2 FOLFOX (WBC 1.8, 12/16, improved to 6.7 s/p G-CSF x 3 days) WBC 6.7 Continue to monitor Continue lorazepam  0.5 mg twice daily as needed Continue Magic mouthwash as needed As needed antiemetics

## 2024-06-30 NOTE — Assessment & Plan Note (Signed)
 Continue amlodipine

## 2024-06-30 NOTE — ED Triage Notes (Signed)
 Pt here after receiving an outpatient CT scan showing a blood clot in her lungs. Pt tachycardic at 150. Pt also having SOB and upper back pain.

## 2024-06-30 NOTE — Consult Note (Signed)
 Pharmacy Consult Note - Anticoagulation  Pharmacy Consult for heparin  infusion Indication: pulmonary embolus Allergies[1]  PATIENT MEASUREMENTS: Height: 5' 9 (175.3 cm) Weight: 97.9 kg (215 lb 13.3 oz) IBW/kg (Calculated) : 66.2 HEPARIN  DW (KG): 87.3  VITAL SIGNS: Temp: 98.4 F (36.9 C) (12/18 1732) Temp Source: Oral (12/18 1732) BP: 159/95 (12/18 1733) Pulse Rate: 150 (12/18 1732)  Recent Labs    06/28/24 0827  HGB 9.6*  HCT 29.4*  PLT 110*  CREATININE 0.94    Estimated Creatinine Clearance: 96.1 mL/min (by C-G formula based on SCr of 0.94 mg/dL).  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 RECK 2018 ANNUAL   Sickle cell trait    Vitamin D  deficiency 01/2016   ADD VIT D3 5000 IU DAILY    Medications:  (Not in a hospital admission)  Scheduled:   heparin   5,200 Units Intravenous Once   Infusions:   heparin      sodium chloride       ASSESSMENT: 43 y.o. female with PMH colon cancer and leukopenia is presenting with multiple subsegmental pulmonary emboli without acute cor pulmonale . 12/18 CT/Angio shows Bilateral lower lobe segmental and subsegmental pulmonary emboli and developing LL lobe pulmonary infarctions, no heart strain. Patient is not on chronic anticoagulation per chart review. Pharmacy has been consulted to initiate and manage heparin  intravenous infusion.   Goal(s) of therapy: Heparin  level 0.3 - 0.7 units/mL aPTT 66 - 102 seconds Monitor platelets by anticoagulation protocol: Yes   Baseline anticoagulation labs:  Recent Labs    06/28/24 0827  HGB 9.6*  PLT 110*    Baseline aPTT pending. 12/18 CBC pending  PLAN:  Give 5,200 units bolus x1; then start heparin   infusion at 1,400 units/hour.  Check heparin  level in 6 hours, then daily once at least two levels are consecutively therapeutic.  Monitor CBC and s/sx of bleeding daily while on heparin  infusion.   Annabella LOISE Banks, PharmD Clinical Pharmacist 06/30/2024 6:15 PM     [1]  Allergies Allergen Reactions   Tape Rash    Port tape she has reaction .

## 2024-06-30 NOTE — ED Provider Notes (Signed)
 [facility] Provider Note    Event Date/Time   First MD Initiated Contact with Patient 06/30/24 1736     (approximate)   History   Abnormal CT scan  HPI  Amber Price is a 43 y.o. female with history of colon cancer and leukopenia presenting today with concern of abnormal CT finding.  Currently being treated for colon cancer receiving chemotherapy, was told that she is having leukopenia so she started getting some injections to assist with this.  Yesterday noticed that she was having some back spasms and some shortness of breath with conversation and exertion.  Was scheduled to have CT imaging done today which was noted to be abnormal and the patient was instructed to come into the emergency department for further assessment and evaluation.  CT scan which I reviewed reveals bilateral lower segmental and subsegmental PE and developing lower lobe pulmonary infarctions.  She has not noticed any swelling in her extremities, no prior history of PEs or DVTs.  Denies known cardiac history.  She has no other complaints at this time.     Physical Exam   Triage Vital Signs: ED Triage Vitals  Encounter Vitals Group     BP 06/30/24 1733 (!) 159/95     Girls Systolic BP Percentile --      Girls Diastolic BP Percentile --      Boys Systolic BP Percentile --      Boys Diastolic BP Percentile --      Pulse Rate 06/30/24 1732 (!) 150     Resp 06/30/24 1732 (!) 22     Temp 06/30/24 1732 98.4 F (36.9 C)     Temp Source 06/30/24 1732 Oral     SpO2 06/30/24 1732 96 %     Weight --      Height --      Head Circumference --      Peak Flow --      Pain Score 06/30/24 1733 7     Pain Loc --      Pain Education --      Exclude from Growth Chart --     Most recent vital signs: Vitals:   06/30/24 1732 06/30/24 1733  BP:  (!) 159/95  Pulse: (!) 150   Resp: (!) 22   Temp: 98.4 F (36.9 C)   SpO2: 96%      General: Awake, no distress.  CV:  Good peripheral  perfusion.  Tachycardic, no extremity swelling Resp:  Normal effort.  Able to speak in short sentences but does become tachypneic during conversation Abd:  No distention.  Soft nontender Other:     ED Results / Procedures / Treatments   Labs (all labs ordered are listed, but only abnormal results are displayed) Labs Reviewed  CBC  COMPREHENSIVE METABOLIC PANEL WITH GFR  HCG, QUANTITATIVE, PREGNANCY  PRO BRAIN NATRIURETIC PEPTIDE  APTT  TROPONIN T, HIGH SENSITIVITY     EKG  Sinus rhythm with rate of about 150, axis of 0, intervals appear to be within normal limits, no obvious ischemia   RADIOLOGY   PROCEDURES:  Critical Care performed: No  Procedures   MEDICATIONS ORDERED IN ED: Medications  sodium chloride  0.9 % bolus 500 mL (has no administration in time range)     IMPRESSION / MDM / ASSESSMENT AND PLAN / ED COURSE  I reviewed the triage vital signs and the nursing notes.  Patient's presentation is most consistent with acute presentation with potential threat to life or bodily function.  43 year old female who presents today with concern of PE noted on CT imaging.  She is tachycardic here in the 140s to 150s range, CT without evidence of right heart strain noted on imaging and moderate PE burden involving the segmental and subsegmental arteries.  Given the significant tachycardia, will start heparin , following up labs and give a small fluid bolus.  Fortunately blood pressure is reassuring, will likely warrant admission.   Clinical Course as of 06/30/24 2354  Thu Jun 30, 2024  2017 Spoke with Dr. Cleatus from medicine team who has agreed to assess and evaluate the patient determine course for further medical management at this time. [SK]    Clinical Course User Index [SK] Fernand Rossie HERO, MD     FINAL CLINICAL IMPRESSION(S) / ED DIAGNOSES   Final diagnoses:  Multiple subsegmental pulmonary emboli without acute cor pulmonale  (HCC)     Rx / DC Orders   ED Discharge Orders     None        Note:  This document was prepared using Dragon voice recognition software and may include unintentional dictation errors.   Fernand Rossie HERO, MD 06/30/24 [phone]

## 2024-06-30 NOTE — Telephone Encounter (Signed)
 Per Dr. Babara, ok to proceed with Zarxio  today. Recommends to take Claritin and Tylenol  PRN

## 2024-06-30 NOTE — Assessment & Plan Note (Signed)
 Previously on methotrexate and folic acid

## 2024-06-30 NOTE — H&P (Incomplete)
 History and Physical    Patient: Amber Price FMW:[id number] DOB: 1980-12-15 DOA: 06/30/2024 DOS: the patient was seen and examined on 06/30/2024 PCP: Fernand Fredy RAMAN, MD  Patient coming from: Home  Chief Complaint: back pain and chest pain  HPI: Amber Price is a 43 y.o. female with medical history significant for HTN, RA on methotrexate,  recent diagnosis of metastatic colon 03/2024 during a hospitalization for E. coli urosepsis, currently on cycle 2 FOLFOX complicated by chemotherapy induced neutropenia (WBC 1.8, 12/16, improved to 6.7 s/p G-CSF x 3 days), being admitted with bilateral acute PE found on outpatient CTA ordered by her oncologist.  She presented for routine visit on 12/16 when she complained of back pain radiating to her chest and shortness of breath and was noted to be tachycardic whereupon the CTA chest was ordered and performed today.  She denies leg pain or swelling.  She continues to have ongoing back and chest pain with dyspnea. In the ED tachycardic to 150, tachypneic to 22, BP 159/95 Labs notable for troponin 15 and proBNP 87 WBC 6.7, hemoglobin10.6 (baseline), platelets 161.  CMP WNL Outpatient CTA showed bilateral lower lobe segmental and subsegmental pulmonary emboli as well as developing peripheral left lower lobe pulmonary infarctions without heart strain. 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 RECK 2018 ANNUAL   Sickle cell trait    Vitamin D  deficiency 01/2016   ADD VIT D3 5000 IU DAILY   Past Surgical History:  Procedure Laterality Date   COLONOSCOPY N/A 04/08/2024   Procedure: COLONOSCOPY;  Surgeon: Jinny Carmine, MD;   Location: [facility] ENDOSCOPY;  Service: Endoscopy;  Laterality: N/A;   IR IMAGING GUIDED PORT INSERTION  05/26/2024   NO PAST SURGERIES     Social History:  reports that she has never smoked. She has never used smokeless tobacco. She reports that she does not currently use alcohol. She reports that she does not use drugs.  Allergies[1]  Family History  Problem Relation Age of Onset   Diabetes Mellitus II Mother    Hypertension Mother    Hypercholesterolemia Father    Cancer Paternal Grandmother 23       LUNG   Sickle cell trait Son     Prior to Admission medications  Medication Sig Start Date End Date Taking? Authorizing Provider  amLODipine  (NORVASC ) 5 MG tablet Take 1 tablet (5 mg total) by mouth daily. 07/02/23 07/01/24  Fernand Fredy RAMAN, MD  Cholecalciferol (VITAMIN D3) 1.25 MG (50000 UT) CAPS TAKE 1 CAPSULE BY MOUTH ONE TIME PER WEEK 08/27/23   Fernand Fredy RAMAN, MD  dexamethasone  (DECADRON ) 4 MG tablet Take 2 tablets (8 mg total) by mouth daily. Start the day after chemotherapy for 2 days. Take with food. 05/19/24   Babara Call, MD  folic acid (FOLVITE) 1 MG tablet Take 1 mg by mouth daily. 04/04/24 04/04/25  [provider]  JUNEL  FE 1/20 1-20 MG-MCG tablet TAKE 1 TABLET BY MOUTH EVERY DAY 05/06/24   Fernand Fredy RAMAN, MD  lidocaine -prilocaine  (EMLA ) cream Apply to affected area once  05/19/24   Babara Call, MD  LORazepam  (ATIVAN ) 0.5 MG tablet Take 1 tablet (0.5 mg total) by mouth every 12 (twelve) hours as needed for anxiety or sleep (nausea). 06/14/24   Babara Call, MD  magic mouthwash (multi-ingredient) oral suspension Take 5-10 mLs by mouth 4 (four) times daily as needed for mouth pain. 06/20/24   Babara Call, MD  ondansetron  (ZOFRAN ) 8 MG tablet Take 1 tablet (8 mg total) by mouth every 8 (eight) hours as needed for nausea or vomiting. Start on the third day after chemotherapy. 05/19/24   Babara Call, MD  prochlorperazine  (COMPAZINE ) 10 MG tablet Take 1 tablet (10 mg total) by mouth every 6 (six)  hours as needed for nausea or vomiting. 05/19/24   Babara Call, MD  rosuvastatin  (CRESTOR ) 20 MG tablet TAKE 1 TABLET BY MOUTH EVERY DAY 11/20/23   Orlean Alan HERO, FNP  valACYclovir  (VALTREX ) 500 MG tablet Take 1 tablet (500 mg total) by mouth daily. 01/11/24   Fernand Fredy RAMAN, MD    Physical Exam: Vitals:   06/30/24 1732 06/30/24 1733 06/30/24 1800 06/30/24 1934  BP:  (!) 159/95  (!) 148/89  Pulse: (!) 150   (!) 132  Resp: (!) 22   (!) 23  Temp: 98.4 F (36.9 C)     TempSrc: Oral     SpO2: 96%   98%  Weight:   97.9 kg   Height:   5' 9 (1.753 m)    Physical Exam  Labs on Admission: I have personally reviewed following labs and imaging studies  CBC: Recent Labs  Lab 06/28/24 0827 06/30/24 1835  WBC 1.8* 6.7  NEUTROABS 0.4*  --   HGB 9.6* 10.6*  HCT 29.4* 32.1*  MCV 73.9* 74.0*  PLT 110* 161   Basic Metabolic Panel: Recent Labs  Lab 06/28/24 0827 06/30/24 1835  NA 140 139  K 3.7 3.8  CL 105 101  CO2 23 23  GLUCOSE 96 88  BUN 6 6  CREATININE 0.94 1.02*  CALCIUM  9.1 8.9   GFR: Estimated Creatinine Clearance: 88.6 mL/min (A) (by C-G formula based on SCr of 1.02 mg/dL (H)). Liver Function Tests: Recent Labs  Lab 06/28/24 0827 06/30/24 1835  AST 23 31  ALT 21 25  ALKPHOS 70 85  BILITOT 0.6 0.7  PROT 6.7 6.7  ALBUMIN 3.8 3.8   No results for input(s): LIPASE, AMYLASE in the last 168 hours. No results for input(s): AMMONIA  in the last 168 hours. Coagulation Profile: No results for input(s): INR, PROTIME in the last 168 hours. Cardiac Enzymes: No results for input(s): CKTOTAL, CKMB, CKMBINDEX, TROPONINI in the last 168 hours. BNP (last 3 results) Recent Labs    06/30/24 1835  PROBNP 87.6   HbA1C: No results for input(s): HGBA1C in the last 72 hours. CBG: No results for input(s): GLUCAP in the last 168 hours. Lipid Profile: No results for input(s): CHOL, HDL, LDLCALC, TRIG, CHOLHDL, LDLDIRECT in the last 72  hours. Thyroid  Function Tests: No results for input(s): TSH, T4TOTAL, FREET4, T3FREE, THYROIDAB in the last 72 hours. Anemia Panel: No results for input(s): VITAMINB12, FOLATE, FERRITIN, TIBC, IRON, RETICCTPCT in the last 72 hours. Urine analysis:    Component Value Date/Time   COLORURINE YELLOW (A) 04/06/2024 1124   APPEARANCEUR HAZY (A) 04/06/2024 1124   LABSPEC 1.006 04/06/2024 1124   PHURINE 6.0 04/06/2024 1124   GLUCOSEU NEGATIVE 04/06/2024 1124   HGBUR MODERATE (A) 04/06/2024 1124   BILIRUBINUR NEGATIVE 04/06/2024 1124   BILIRUBINUR negative  08/19/2022 1121   KETONESUR NEGATIVE 04/06/2024 1124   PROTEINUR 30 (A) 04/06/2024 1124   UROBILINOGEN 0.2 08/19/2022 1121   NITRITE POSITIVE (A) 04/06/2024 1124   LEUKOCYTESUR MODERATE (A) 04/06/2024 1124    Radiological Exams on Admission: CT Angio Chest Pulmonary Embolism (PE) W or WO Contrast Result Date: 06/30/2024 EXAM: CTA CHEST 06/30/2024 03:59:59 PM TECHNIQUE: CTA of the chest was performed without and with the administration of 75 mL of iohexol  (OMNIPAQUE ) 350 MG/ML injection. Multiplanar reformatted images are provided for review. MIP images are provided for review. Automated exposure control, iterative reconstruction, and/or weight based adjustment of the mA/kV was utilized to reduce the radiation dose to as low as reasonably achievable. COMPARISON: None available. CLINICAL HISTORY: tachycardia FINDINGS: PULMONARY ARTERIES: Pulmonary arteries are adequately opacified for evaluation. Bilateral lower lobe segmental and subsegmental pulmonary emboli. Main pulmonary artery is normal in caliber. MEDIASTINUM: The heart and pericardium demonstrate no acute abnormality. No enlarged RV-LV ratio. There is no acute abnormality of the thoracic aorta. LYMPH NODES: No mediastinal, hilar or axillary lymphadenopathy. LUNGS AND PLEURA: Peripheral wedge-shaped airspace opacities as well as ground-glass opacities along the left  lower lobe suggestive of developing peripheral left lower lobe pulmonary infarctions. No evidence of pleural effusion or pneumothorax. UPPER ABDOMEN: Limited images of the upper abdomen are unremarkable. SOFT TISSUES AND BONES: No acute bone or soft tissue abnormality. IMPRESSION: 1. Bilateral lower lobe segmental and subsegmental pulmonary emboli. Developing peripheral left lower lobe pulmonary infarctions. No right heart strain. PRA to call report. Electronically signed by: Morgane Naveau MD 06/30/2024 04:11 PM EST RP Workstation: [id number]   Data Reviewed for HPI: Relevant notes from primary care and specialist visits, past discharge summaries as available in EHR, including Care Everywhere. Prior diagnostic testing as pertinent to current admission diagnoses Updated medications and problem lists for reconciliation ED course, including vitals, labs, imaging, treatment and response to treatment Triage notes, nursing and pharmacy notes and ED provider's notes Notable results as noted above in HPI      Assessment and Plan: * Acute bilateral pulmonary embolism with LLL pulmonary infarction (HCC) Back pain/chest pain/shortness of breath secondary to above Suspect related to cancer diagnosis CTA showed bilateral lower lobe segmental and subsegmental pulmonary emboli as well as developing peripheral left lower lobe pulmonary infarctions without heart strain Patient symptomatic for back pain radiating to the chest.  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 Developed neutropenia s/p cycle 2 FOLFOX (WBC 1.8, 12/16, improved to 6.7 s/p G-CSF x 3 days) WBC 6.7 Continue to monitor Continue lorazepam  0.5 mg twice daily as needed Continue  Magic mouthwash as needed As needed antiemetics  Essential hypertension, benign Continue amlodipine   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 Observation status is judged to be reasonable and necessary in order to provide the required intensity of service to ensure the patient's safety. The patient's presenting symptoms, physical exam findings, and initial radiographic and laboratory data in the context of their medical condition is felt to place them at decreased risk  for further clinical deterioration. Furthermore, it is anticipated that the patient will be medically stable for discharge from the hospital within 2 midnights of admission.   Author: Delayne LULLA Solian, MD 06/30/2024 8:28 PM  For on call review [email or web address].     [1]  Allergies Allergen Reactions   Tape Rash    Port tape she has reaction .

## 2024-07-01 ENCOUNTER — Encounter: Payer: Self-pay | Admitting: Oncology

## 2024-07-01 ENCOUNTER — Other Ambulatory Visit: Payer: Self-pay

## 2024-07-01 ENCOUNTER — Inpatient Hospital Stay

## 2024-07-01 DIAGNOSIS — I2699 Other pulmonary embolism without acute cor pulmonale: Secondary | ICD-10-CM | POA: Diagnosis not present

## 2024-07-01 LAB — CBC
HCT: 31.9 % — ABNORMAL LOW (ref 36.0–46.0)
Hemoglobin: 10.4 g/dL — ABNORMAL LOW (ref 12.0–15.0)
MCH: 24.1 pg — ABNORMAL LOW (ref 26.0–34.0)
MCHC: 32.6 g/dL (ref 30.0–36.0)
MCV: 74 fL — ABNORMAL LOW (ref 80.0–100.0)
Platelets: 159 K/uL (ref 150–400)
RBC: 4.31 MIL/uL (ref 3.87–5.11)
RDW: 17.2 % — ABNORMAL HIGH (ref 11.5–15.5)
WBC: 13.3 K/uL — ABNORMAL HIGH (ref 4.0–10.5)
nRBC: 0.5 % — ABNORMAL HIGH (ref 0.0–0.2)

## 2024-07-01 LAB — HIV ANTIBODY (ROUTINE TESTING W REFLEX): HIV Screen 4th Generation wRfx: NONREACTIVE

## 2024-07-01 LAB — HEPARIN LEVEL (UNFRACTIONATED): Heparin Unfractionated: 0.82 [IU]/mL — ABNORMAL HIGH (ref 0.30–0.70)

## 2024-07-01 MED ORDER — HEPARIN (PORCINE) 25000 UT/250ML-% IV SOLN: 1250.0000 [IU]/h | INTRAVENOUS | Status: DC

## 2024-07-01 MED ORDER — APIXABAN 5 MG PO TABS
10.0000 mg | ORAL_TABLET | Freq: Two times a day (BID) | ORAL | Status: DC
  Administered 2024-07-01: 10 mg via ORAL
  Filled 2024-07-01: qty 2

## 2024-07-01 MED ORDER — APIXABAN 5 MG PO TABS
5.0000 mg | ORAL_TABLET | Freq: Two times a day (BID) | ORAL | 4 refills | Status: AC
  Filled 2024-07-01 – 2024-07-29 (×2): qty 60, 30d supply, fill #0

## 2024-07-01 MED ORDER — APIXABAN (ELIQUIS) VTE STARTER PACK (10MG AND 5MG)
ORAL_TABLET | ORAL | 0 refills | Status: DC
  Filled 2024-07-01: qty 74, 28d supply, fill #0

## 2024-07-01 NOTE — ED Notes (Addendum)
 Pt made aware that she was being discharged and that she had medication at the [facility] pharmacy that she needed to pick up. Her mother is at bedside. Pt also made aware that she did not need to take the 3rd dose of filgrastim .

## 2024-07-01 NOTE — Consult Note (Signed)
 Pharmacy Consult Note - Anticoagulation  Pharmacy Consult for heparin  infusion Indication: pulmonary embolus Allergies[1]  PATIENT MEASUREMENTS: Height: 5' 9 (175.3 cm) Weight: 97.9 kg (215 lb 13.3 oz) IBW/kg (Calculated) : 66.2 HEPARIN  DW (KG): 87.3  VITAL SIGNS: Temp: 98 F (36.7 C) (12/19 0500)  Recent Labs    06/30/24 1835 07/01/24 0710  HGB 10.6*  --   HCT 32.1*  --   PLT 161  --   APTT 26  --   LABPROT 13.6  --   INR 1.0  --   HEPARINUNFRC  --  0.82*  CREATININE 1.02*  --     Estimated Creatinine Clearance: 88.6 mL/min (A) (by C-G formula based on SCr of 1.02 mg/dL (H)).  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 RECK 2018 ANNUAL   Sickle cell trait    Vitamin D  deficiency 01/2016   ADD VIT D3 5000 IU DAILY    Medications:  (Not in a hospital admission)  Scheduled:   amLODipine   5 mg Oral Daily   norethindrone -ethinyl estradiol -FE  1 tablet Oral Daily   Infusions:   heparin  1,400 Units/hr (07/01/24 9278)    ASSESSMENT: 43 y.o. female with PMH colon cancer and leukopenia is presenting with multiple subsegmental pulmonary emboli without acute cor pulmonale . 12/18 CT/Angio shows Bilateral lower lobe segmental and subsegmental pulmonary emboli and developing LL lobe pulmonary infarctions, no heart strain. Patient is not on chronic anticoagulation per chart review. Pharmacy has been consulted to initiate and manage heparin  intravenous infusion.  Goal(s) of therapy: Heparin  level 0.3 - 0.7 units/mL aPTT 66 - 102 seconds Monitor platelets by anticoagulation protocol: Yes   Baseline anticoagulation labs: Recent Labs    06/28/24 0827 06/30/24 1835  APTT  --  26  INR  --  1.0   HGB 9.6* 10.6*  PLT 110* 161   Date Time HL Rate/Comment 12/19 0710 0.82 SUPRAtherapeutic  PLAN: Heparin  level supratherapeutic this AM; CBC stable, no signs/symptoms of bleeding noted Decrease heparin  infusion rate to 1250 units/hour Check heparin  level 6 hours after rate change Monitor daily heparin  levels and CBC while on heparin  infusion Monitor signs/symptoms of bleeding  Thank you for involving pharmacy in this patient's care.   Amber Price, PharmD Clinical Pharmacist 07/01/2024 8:02 AM    [1]  Allergies Allergen Reactions   Tape Rash    Port tape she has reaction .

## 2024-07-01 NOTE — Discharge Summary (Signed)
 "  Physician Discharge Summary   Amber Price  female DOB: 12-17-1980  FMW:[id number]  PCP: Fernand Fredy RAMAN, MD  Admit date: 06/30/2024 Discharge date: 07/01/2024  Admitted From: home Disposition:  home CODE STATUS: Full code  Discharge Instructions     Discharge instructions   Complete by: As directed    For the blood clots in your lungs, please finish the Eliquis  starter pack as directed, and starting on 2nd month, take Eliquis  5 mg twice daily for at least 5 more months. [facility] Course:  For full details, please see H&P, progress notes, consult notes and ancillary notes.  Briefly,  Amber Price is a 43 y.o. female with medical history significant for HTN, RA on methotrexate,  recent diagnosis of metastatic colon cancer 03/2024 during a hospitalization for E. coli urosepsis, currently on cycle 2 FOLFOX complicated by chemotherapy induced neutropenia (currently on filgrastim  day 2 of 3 days, started 12/16), being admitted with bilateral acute PE found on outpatient CTA ordered by her oncologist.    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 CTA showed bilateral lower lobe segmental and subsegmental pulmonary emboli as well as developing peripheral left lower lobe pulmonary infarctions without heart strain.   --lower extremity venous ultrasound negative for DVT --started on heparin  gtt and transitioned to Eliquis  prior to discharge.   Primary colon cancer with metastasis to liver [facility]) Chemotherapy induced neutropenia(FOLFOX) --cell counts improved after 2 doses of filgrastim .  Per Dr. Babara, no need to give the 3rd dose.  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         Medication List     STOP taking these medications    magic mouthwash (multi-ingredient) oral suspension       TAKE these medications    amLODipine  5 MG tablet Commonly known as: NORVASC  Take 1 tablet (5 mg total) by mouth daily.   Apixaban  Starter Pack (10mg  and 5mg ) Commonly known as: ELIQUIS  STARTER PACK Take as directed on package: start with two-5mg  tablets twice daily for 7 days. On day 8, switch to one-5mg  tablet twice daily.   apixaban  5 MG Tabs tablet Commonly known as: ELIQUIS  Take 1 tablet (5 mg total) by mouth 2 (two) times daily. Start taking on: July 31, 2024   dexamethasone  4 MG tablet Commonly known as: DECADRON  Take 2 tablets (8 mg total) by mouth daily. Start the day after chemotherapy for 2 days. Take with food.   folic acid 1 MG tablet Commonly known as: FOLVITE Take 1 mg by mouth daily.   Junel  FE 1/20 1-20 MG-MCG tablet  Generic drug: norethindrone -ethinyl estradiol -FE TAKE 1 TABLET BY MOUTH EVERY DAY   lidocaine -prilocaine  cream Commonly known as: EMLA  Apply to affected area once   LORazepam  0.5 MG tablet Commonly known as: ATIVAN  Take 1 tablet (0.5 mg total) by mouth every 12 (twelve) hours as needed for anxiety or sleep (nausea).   ondansetron  8 MG tablet Commonly known as: Zofran  Take 1 tablet (8 mg total) by mouth  every 8 (eight) hours as needed for nausea or vomiting. Start on the third day after chemotherapy.   prochlorperazine  10 MG tablet Commonly known as: COMPAZINE  Take 1 tablet (10 mg total) by mouth every 6 (six) hours as needed for nausea or vomiting.   rosuvastatin  20 MG tablet Commonly known as: CRESTOR  TAKE 1 TABLET BY MOUTH EVERY DAY   valACYclovir  500 MG tablet Commonly known as: VALTREX  Take 1 tablet (500 mg total) by mouth daily.   Vitamin D3 1.25 MG (50000 UT) Caps TAKE 1 CAPSULE BY MOUTH ONE TIME PER WEEK         Follow-up Information     Babara Call, MD Follow up in 1 week(s).   Specialty: Oncology Contact information: [address] [place] [address] [phone]                 Allergies[1]   The results of significant diagnostics from this hospitalization (including imaging, microbiology, ancillary and laboratory) are listed below for reference.   Consultations:   Procedures/Studies: US  Venous Img Lower Bilateral (DVT) Result Date: 06/30/2024 EXAM: ULTRASOUND DUPLEX OF THE BILATERAL LOWER EXTREMITY VEINS TECHNIQUE: Duplex ultrasound using B-mode/gray scaled imaging and Doppler spectral analysis and color flow was obtained of the deep venous structures of the bilateral lower extremity. COMPARISON: None available. CLINICAL HISTORY: 449891 Acute pulmonary embolism (HCC) 449891 Acute pulmonary embolism (HCC) FINDINGS: The common femoral vein, femoral vein, popliteal vein, and posterior tibial vein demonstrate normal compressibility with normal color flow and spectral analysis. IMPRESSION: 1. No evidence of DVT. Electronically signed by: Oneil Devonshire MD 06/30/2024 11:10 PM EST RP Workstation: [id number]   CT Angio Chest Pulmonary Embolism (PE) W or WO Contrast Result Date: 06/30/2024 EXAM: CTA CHEST 06/30/2024 03:59:59 PM TECHNIQUE: CTA of the chest was performed without and with the administration of 75 mL of iohexol  (OMNIPAQUE ) 350 MG/ML injection.  Multiplanar reformatted images are provided for review. MIP images are provided for review. Automated exposure control, iterative reconstruction, and/or weight based adjustment of the mA/kV was utilized to reduce the radiation dose to as low as reasonably achievable. COMPARISON: None available. CLINICAL HISTORY: tachycardia FINDINGS: PULMONARY ARTERIES: Pulmonary arteries are adequately opacified for evaluation. Bilateral lower lobe segmental and subsegmental pulmonary emboli. Main pulmonary artery is normal in caliber. MEDIASTINUM: The heart and pericardium demonstrate no acute abnormality. No enlarged RV-LV ratio. There is no acute abnormality of the thoracic aorta. LYMPH NODES: No mediastinal, hilar or axillary lymphadenopathy. LUNGS AND PLEURA: Peripheral wedge-shaped airspace opacities as well as ground-glass opacities along the left lower lobe suggestive of developing peripheral left lower lobe pulmonary infarctions. No evidence of pleural effusion or pneumothorax. UPPER ABDOMEN: Limited images of the upper abdomen are unremarkable. SOFT TISSUES AND BONES: No acute bone or soft tissue abnormality. IMPRESSION: 1. Bilateral lower lobe segmental and subsegmental pulmonary emboli. Developing peripheral left lower lobe pulmonary infarctions. No right heart strain. PRA to call report. Electronically signed by: Morgane Naveau MD 06/30/2024 04:11 PM EST RP Workstation: [id number]      Labs: BNP (last 3 results) No results for input(s):  BNP in the last 8760 hours. Basic Metabolic Panel: Recent Labs  Lab 06/28/24 0827 06/30/24 1835  NA 140 139  K 3.7 3.8  CL 105 101  CO2 23 23  GLUCOSE 96 88  BUN 6 6  CREATININE 0.94 1.02*  CALCIUM  9.1 8.9   Liver Function Tests: Recent Labs  Lab 06/28/24 0827 06/30/24 1835  AST 23 31  ALT 21 25  ALKPHOS 70 85  BILITOT 0.6 0.7  PROT 6.7 6.7  ALBUMIN 3.8 3.8   No results for input(s): LIPASE, AMYLASE in the last 168 hours. No results for input(s):  AMMONIA  in the last 168 hours. CBC: Recent Labs  Lab 06/28/24 0827 06/30/24 1835 07/01/24 0710  WBC 1.8* 6.7 13.3*  NEUTROABS 0.4*  --   --   HGB 9.6* 10.6* 10.4*  HCT 29.4* 32.1* 31.9*  MCV 73.9* 74.0* 74.0*  PLT 110* 161 159   Cardiac Enzymes: No results for input(s): CKTOTAL, CKMB, CKMBINDEX, TROPONINI in the last 168 hours. BNP: Invalid input(s): POCBNP CBG: No results for input(s): GLUCAP in the last 168 hours. D-Dimer No results for input(s): DDIMER in the last 72 hours. Hgb A1c No results for input(s): HGBA1C in the last 72 hours. Lipid Profile No results for input(s): CHOL, HDL, LDLCALC, TRIG, CHOLHDL, LDLDIRECT in the last 72 hours. Thyroid  function studies No results for input(s): TSH, T4TOTAL, T3FREE, THYROIDAB in the last 72 hours.  Invalid input(s): FREET3 Anemia work up No results for input(s): VITAMINB12, FOLATE, FERRITIN, TIBC, IRON, RETICCTPCT in the last 72 hours. Urinalysis    Component Value Date/Time   COLORURINE YELLOW (A) 04/06/2024 1124   APPEARANCEUR HAZY (A) 04/06/2024 1124   LABSPEC 1.006 04/06/2024 1124   PHURINE 6.0 04/06/2024 1124   GLUCOSEU NEGATIVE 04/06/2024 1124   HGBUR MODERATE (A) 04/06/2024 1124   BILIRUBINUR NEGATIVE 04/06/2024 1124   BILIRUBINUR negative 08/19/2022 1121   KETONESUR NEGATIVE 04/06/2024 1124   PROTEINUR 30 (A) 04/06/2024 1124   UROBILINOGEN 0.2 08/19/2022 1121   NITRITE POSITIVE (A) 04/06/2024 1124   LEUKOCYTESUR MODERATE (A) 04/06/2024 1124   Sepsis Labs Recent Labs  Lab 06/28/24 0827 06/30/24 1835 07/01/24 0710  WBC 1.8* 6.7 13.3*   Microbiology No results found for this or any previous visit (from the past 240 hours).   Total time spend on discharging this patient, including the last patient exam, discussing the hospital stay, instructions for ongoing care as it relates to all pertinent caregivers, as well as preparing the medical discharge records,  prescriptions, and/or referrals as applicable, is 40 minutes.    Ellouise Haber, MD  [facility] 07/01/2024, 12:04 PM       [1]  Allergies Allergen Reactions   Tape Rash    Port tape she has reaction .    "

## 2024-07-01 NOTE — ED Notes (Signed)
 [facility] called and verified they are monitoring patient

## 2024-07-04 ENCOUNTER — Ambulatory Visit

## 2024-07-05 ENCOUNTER — Encounter: Payer: Self-pay | Admitting: Oncology

## 2024-07-12 ENCOUNTER — Encounter: Payer: Self-pay | Admitting: Internal Medicine

## 2024-07-12 ENCOUNTER — Ambulatory Visit (INDEPENDENT_AMBULATORY_CARE_PROVIDER_SITE_OTHER): Admitting: Internal Medicine

## 2024-07-12 VITALS — BP 114/84 | HR 116 | Ht 69.0 in | Wt 215.2 lb

## 2024-07-12 DIAGNOSIS — R7303 Prediabetes: Secondary | ICD-10-CM | POA: Diagnosis not present

## 2024-07-12 DIAGNOSIS — I10 Essential (primary) hypertension: Secondary | ICD-10-CM

## 2024-07-12 DIAGNOSIS — R9431 Abnormal electrocardiogram [ECG] [EKG]: Secondary | ICD-10-CM | POA: Diagnosis not present

## 2024-07-12 DIAGNOSIS — I2699 Other pulmonary embolism without acute cor pulmonale: Secondary | ICD-10-CM | POA: Diagnosis not present

## 2024-07-12 DIAGNOSIS — C189 Malignant neoplasm of colon, unspecified: Secondary | ICD-10-CM | POA: Diagnosis not present

## 2024-07-12 DIAGNOSIS — R000 Tachycardia, unspecified: Secondary | ICD-10-CM | POA: Diagnosis not present

## 2024-07-12 DIAGNOSIS — E559 Vitamin D deficiency, unspecified: Secondary | ICD-10-CM | POA: Diagnosis not present

## 2024-07-12 DIAGNOSIS — E782 Mixed hyperlipidemia: Secondary | ICD-10-CM

## 2024-07-12 MED ORDER — METOPROLOL SUCCINATE ER 25 MG PO TB24: 25.0000 mg | ORAL_TABLET | Freq: Every day | ORAL | 11 refills | Status: AC

## 2024-07-12 NOTE — Progress Notes (Signed)
 "  Established Patient Office Visit  Subjective:  Patient ID: Amber Price, female    DOB: March 08, 1981  Age: 43 y.o. MRN: [id number]  Chief Complaint  Patient presents with   Follow-up    3 month follow up    Patient comes in for her ED follow-up from 06/30/2024 where she was diagnosed with multiple bilateral segmental and subsegmental pulmonary emboli.  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   Patient had felt a rapid heartbeat, with sinus tachycardia and a CTA of the chest was  ordered by her oncologist.  Patient was started on p.o. Eliquis , currently on 5 mg twice a day and is tolerating it well.   Denies chest pain, no shortness of breath but continues to have a rapid heartbeat.  Her initial pulse today is 116.  EKG was done which shows sinus tachycardia with T wave inversions in anterior leads.  Will schedule a cardiology referral, and start metoprolol  succinate 25 mg daily. Patient is due for fasting labs today.    No other concerns at this time.   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 RECK 2018 ANNUAL   Sickle cell trait    Vitamin D  deficiency 01/2016   ADD VIT D3 5000 IU DAILY    Past Surgical History:  Procedure Laterality Date   COLONOSCOPY N/A 04/08/2024   Procedure: COLONOSCOPY;  Surgeon: Jinny Carmine, MD;  Location: [facility] ENDOSCOPY;  Service: Endoscopy;  Laterality: N/A;   IR IMAGING GUIDED PORT INSERTION  05/26/2024   NO PAST SURGERIES      Social History   Socioeconomic History   Marital status: Single    Spouse name: Not on file   Number of children: 3   Years of education: 14   Highest education level: Not on file  Occupational History   Occupation:  BUSINESS OFFICE SPECIALIST  Tobacco Use   Smoking status: Never   Smokeless tobacco: Never  Vaping Use   Vaping status: Never Used  Substance and Sexual Activity   Alcohol use: Not Currently   Drug use: No   Sexual activity: Yes    Birth control/protection: Pill  Other Topics Concern   Not on file  Social History Narrative   Not on file   Social Drivers of Health   Tobacco Use: Low Risk (07/12/2024)   Patient History    Smoking Tobacco Use: Never    Smokeless Tobacco Use: Never    Passive Exposure: Not on file  Financial Resource Strain: Low Risk (04/19/2024)   Received from [facility]   Overall Financial Resource Strain (CARDIA)    How hard is it for you to pay for the very basics like food, housing, medical care, and heating?: Not very hard  Recent Concern: Financial Resource Strain - Medium Risk (04/06/2024)   Received from [facility]   Overall Financial Resource Strain (CARDIA)    Difficulty of Paying Living Expenses: Somewhat hard  Food Insecurity: No Food Insecurity (04/19/2024)   Received from [facility]   Epic    Within the past 12 months, you  worried that your food would run out before you got the money to buy more.: Never true    Within the past 12 months, the food you bought just didn't last and you didn't have money to get more.: Never true  Recent Concern: Food Insecurity - Food Insecurity Present (04/12/2024)   Epic    Worried About Programme Researcher, Broadcasting/film/video in the Last Year: Sometimes true    Ran Out of Food in the Last Year: Never true  Transportation Needs: No Transportation Needs (04/19/2024)   Received from [facility] - Transportation    Lack of Transportation (Medical): No    Lack of Transportation (Non-Medical): No  Physical Activity: Not on file  Stress: Not on file  Social Connections: Not on file  Intimate Partner Violence: Not At Risk (04/15/2024)   Epic    Fear of Current or Ex-Partner: No    Emotionally  Abused: No    Physically Abused: No    Sexually Abused: No  Depression (PHQ2-9): Low Risk (06/14/2024)   Depression (PHQ2-9)    PHQ-2 Score: 0  Alcohol Screen: Not on file  Housing: Low Risk (04/15/2024)   Epic    Unable to Pay for Housing in the Last Year: No    Number of Times Moved in the Last Year: 0    Homeless in the Last Year: No  Utilities: Low Risk (04/19/2024)   Received from [facility]   Utilities    Within the past 12 months, have you been unable to get utilities(heat, electricity) when it was really needed?: No  Recent Concern: Utilities - At Risk (04/06/2024)   Received from [facility]   Epic    In the past 12 months has the electric, gas, oil, or water  company threatened to shut off services in your home?: Yes  Health Literacy: Low Risk (04/19/2024)   Received from [facility] Literacy    How often do you need to have someone help you when you read instructions, pamphlets, or other written material from your doctor or pharmacy?: Never    Family History  Problem Relation Age of Onset   Diabetes Mellitus II Mother    Hypertension Mother    Hypercholesterolemia Father    Cancer Paternal Grandmother 49       LUNG   Sickle cell trait Son     Allergies[1]  Show/hide medication list[2]  Review of Systems  Constitutional: Negative.  Negative for chills, fever and malaise/fatigue.  HENT: Negative.  Negative for congestion and sore throat.   Eyes: Negative.  Negative for blurred vision and pain.  Respiratory: Negative.  Negative for cough and shortness of breath.   Cardiovascular:  Positive for palpitations. Negative for chest pain and leg swelling.  Gastrointestinal: Negative.  Negative for abdominal pain, blood in stool, constipation, diarrhea, heartburn, melena, nausea and vomiting.  Genitourinary: Negative.  Negative for dysuria, flank pain, frequency and urgency.  Musculoskeletal: Negative.  Negative for joint pain and  myalgias.  Skin: Negative.   Neurological: Negative.  Negative for dizziness, tingling, sensory change, weakness and headaches.  Endo/Heme/Allergies: Negative.   Psychiatric/Behavioral: Negative.  Negative for depression and suicidal ideas. The patient is not nervous/anxious.        Objective:   BP 114/84   Pulse (!) 116   Ht 5' 9 (1.753 m)   Wt 215 lb 3.2 oz (97.6 kg)   SpO2 99%   BMI 31.78 kg/m  Vitals:   07/12/24 1007  BP: 114/84  Pulse: (!) 116  Height: 5' 9 (1.753 m)  Weight: 215 lb 3.2 oz (97.6 kg)  SpO2: 99%  BMI (Calculated): 31.76    Physical Exam Vitals and nursing note reviewed.  Constitutional:      Appearance: Normal appearance.  HENT:     Head: Normocephalic and atraumatic.     Nose: Nose normal.     Mouth/Throat:     Mouth: Mucous membranes are moist.     Pharynx: Oropharynx is clear.  Eyes:     Conjunctiva/sclera: Conjunctivae normal.     Pupils: Pupils are equal, round, and reactive to light.  Cardiovascular:     Rate and Rhythm: Normal rate and regular rhythm.     Pulses: Normal pulses.     Heart sounds: Normal heart sounds. No murmur heard. Pulmonary:     Effort: Pulmonary effort is normal.     Breath sounds: Normal breath sounds. No wheezing.  Abdominal:     General: Bowel sounds are normal.     Palpations: Abdomen is soft.     Tenderness: There is no abdominal tenderness. There is no right CVA tenderness or left CVA tenderness.  Musculoskeletal:        General: Normal range of motion.     Cervical back: Normal range of motion.     Right lower leg: No edema.     Left lower leg: No edema.  Skin:    General: Skin is warm and dry.  Neurological:     General: No focal deficit present.     Mental Status: She is alert and oriented to person, place, and time.  Psychiatric:        Mood and Affect: Mood normal.        Behavior: Behavior normal.      No results found for any visits on 07/12/24.  Recent Results (from the past 2160  hours)  CMP (Cancer Center only)     Status: Abnormal   Collection Time: 05/31/24  8:13 AM  Result Value Ref Range   Sodium 138 135 - 145 mmol/L   Potassium 3.6 3.5 - 5.1 mmol/L   Chloride 106 98 - 111 mmol/L   CO2 23 22 - 32 mmol/L   Glucose, Bld 96 70 - 99 mg/dL    Comment: Glucose reference range applies only to samples taken after fasting for at least 8 hours.   BUN 10 6 - 20 mg/dL   Creatinine 9.10 9.55 - 1.00 mg/dL   Calcium  8.7 (L) 8.9 - 10.3 mg/dL   Total Protein 7.1 6.5 - 8.1 g/dL   Albumin 3.5 3.5 - 5.0 g/dL   AST 15 15 - 41 U/L   ALT 11 0 - 44 U/L   Alkaline Phosphatase 47 38 - 126 U/L   Total Bilirubin 0.7 0.0 - 1.2 mg/dL   GFR, Estimated >39 >39 mL/min    Comment: (NOTE) Calculated using the CKD-EPI Creatinine Equation (2021)    Anion gap 9 5 - 15    Comment: Performed at [facility], [address] [place], [place] [address]  CBC with Differential (Cancer Center Only)     Status: Abnormal   Collection Time: 05/31/24  8:13 AM  Result Value Ref Range   WBC Count 6.1 4.0 - 10.5 K/uL   RBC 4.18 3.87 - 5.11 MIL/uL   Hemoglobin 10.3 (L) 12.0 - 15.0 g/dL    Comment: Reticulocyte Hemoglobin testing may be clinically indicated,  consider ordering this additional test [id number]    HCT 31.1 (L) 36.0 - 46.0 %   MCV 74.4 (L) 80.0 - 100.0 fL   MCH 24.6 (L) 26.0 - 34.0 pg   MCHC 33.1 30.0 - 36.0 g/dL   RDW 85.4 88.4 - 84.4 %   Platelet Count 320 150 - 400 K/uL   nRBC 0.0 0.0 - 0.2 %   Neutrophils Relative % 61 %   Neutro Abs 3.7 1.7 - 7.7 K/uL   Lymphocytes Relative 27 %   Lymphs Abs 1.6 0.7 - 4.0 K/uL   Monocytes Relative 8 %   Monocytes Absolute 0.5 0.1 - 1.0 K/uL   Eosinophils Relative 3 %   Eosinophils Absolute 0.2 0.0 - 0.5 K/uL   Basophils Relative 1 %   Basophils Absolute 0.0 0.0 - 0.1 K/uL   Immature Granulocytes 0 %   Abs Immature Granulocytes 0.01 0.00 - 0.07 K/uL    Comment: Performed at [facility], [address] [place], [place]  [address]  Pregnancy, urine     Status: None   Collection Time: 05/31/24  8:13 AM  Result Value Ref Range   Preg Test, Ur NEGATIVE NEGATIVE    Comment:        THE SENSITIVITY OF THIS METHODOLOGY IS >20 mIU/mL. Performed at [facility], [address] [place], [place] [address]   CMP (Cancer Center only)     Status: Abnormal   Collection Time: 06/07/24  8:44 AM  Result Value Ref Range   Sodium 135 135 - 145 mmol/L   Potassium 3.8 3.5 - 5.1 mmol/L   Chloride 104 98 - 111 mmol/L   CO2 23 22 - 32 mmol/L   Glucose, Bld 85 70 - 99 mg/dL    Comment: Glucose reference range applies only to samples taken after fasting for at least 8 hours.   BUN 15 6 - 20 mg/dL   Creatinine 8.96 (H) 9.55 - 1.00 mg/dL   Calcium  8.7 (L) 8.9 - 10.3 mg/dL   Total Protein 6.9 6.5 - 8.1 g/dL   Albumin 3.4 (L) 3.5 - 5.0 g/dL   AST 18 15 - 41 U/L   ALT 14 0 - 44 U/L   Alkaline Phosphatase 41 38 - 126 U/L   Total Bilirubin 0.8 0.0 - 1.2 mg/dL   GFR, Estimated >39 >39 mL/min    Comment: (NOTE) Calculated using the CKD-EPI Creatinine Equation (2021)    Anion gap 8 5 - 15    Comment: Performed at [facility], [address] [place], [place] [address]  CBC with Differential (Cancer Center Only)     Status: Abnormal   Collection Time: 06/07/24  8:44 AM  Result Value Ref Range   WBC Count 5.5 4.0 - 10.5 K/uL   RBC 4.38 3.87 - 5.11 MIL/uL   Hemoglobin 10.4 (L) 12.0 - 15.0 g/dL    Comment: Reticulocyte Hemoglobin testing may be clinically indicated, consider ordering this additional test [id number]    HCT 32.3 (L) 36.0 - 46.0 %   MCV 73.7 (L) 80.0 - 100.0 fL   MCH 23.7 (L) 26.0 - 34.0 pg   MCHC 32.2 30.0 - 36.0 g/dL   RDW 85.3 88.4 - 84.4 %   Platelet Count 223 150 - 400 K/uL   nRBC 0.0 0.0 - 0.2 %   Neutrophils Relative % 58 %   Neutro Abs 3.2 1.7 - 7.7 K/uL   Lymphocytes Relative 35 %   Lymphs Abs 2.0  0.7 - 4.0 K/uL   Monocytes Relative 4 %   Monocytes Absolute 0.2 0.1 - 1.0 K/uL    Eosinophils Relative 2 %   Eosinophils Absolute 0.1 0.0 - 0.5 K/uL   Basophils Relative 0 %   Basophils Absolute 0.0 0.0 - 0.1 K/uL   Immature Granulocytes 1 %   Abs Immature Granulocytes 0.04 0.00 - 0.07 K/uL    Comment: Performed at [facility], [address] [place], [place] [address]  CMP (Cancer Center only)     Status: Abnormal   Collection Time: 06/14/24  9:07 AM  Result Value Ref Range   Sodium 138 135 - 145 mmol/L   Potassium 3.7 3.5 - 5.1 mmol/L   Chloride 105 98 - 111 mmol/L   CO2 23 22 - 32 mmol/L   Glucose, Bld 109 (H) 70 - 99 mg/dL    Comment: Glucose reference range applies only to samples taken after fasting for at least 8 hours.   BUN 9 6 - 20 mg/dL   Creatinine 8.98 (H) 9.55 - 1.00 mg/dL   Calcium  8.9 8.9 - 10.3 mg/dL   Total Protein 7.2 6.5 - 8.1 g/dL   Albumin 3.6 3.5 - 5.0 g/dL   AST 28 15 - 41 U/L   ALT 25 0 - 44 U/L   Alkaline Phosphatase 63 38 - 126 U/L   Total Bilirubin 0.6 0.0 - 1.2 mg/dL   GFR, Estimated >39 >39 mL/min    Comment: (NOTE) Calculated using the CKD-EPI Creatinine Equation (2021)    Anion gap 10 5 - 15    Comment: Performed at [facility], [address] [place], [place] [address]  CBC with Differential (Cancer Center Only)     Status: Abnormal   Collection Time: 06/14/24  9:07 AM  Result Value Ref Range   WBC Count 3.7 (L) 4.0 - 10.5 K/uL   RBC 4.49 3.87 - 5.11 MIL/uL   Hemoglobin 10.8 (L) 12.0 - 15.0 g/dL    Comment: Reticulocyte Hemoglobin testing may be clinically indicated, consider ordering this additional test [id number]    HCT 33.1 (L) 36.0 - 46.0 %   MCV 73.7 (L) 80.0 - 100.0 fL   MCH 24.1 (L) 26.0 - 34.0 pg   MCHC 32.6 30.0 - 36.0 g/dL   RDW 85.0 88.4 - 84.4 %   Platelet Count 204 150 - 400 K/uL   nRBC 0.0 0.0 - 0.2 %   Neutrophils Relative % 40 %   Neutro Abs 1.5 (L) 1.7 - 7.7 K/uL   Lymphocytes Relative 45 %   Lymphs Abs 1.7 0.7 - 4.0 K/uL   Monocytes Relative 12 %   Monocytes Absolute 0.4 0.1 -  1.0 K/uL   Eosinophils Relative 2 %   Eosinophils Absolute 0.1 0.0 - 0.5 K/uL   Basophils Relative 1 %   Basophils Absolute 0.0 0.0 - 0.1 K/uL   Immature Granulocytes 0 %   Abs Immature Granulocytes 0.01 0.00 - 0.07 K/uL    Comment: Performed at [facility], [address] [place], [place] [address]  CMP (Cancer Center only)     Status: None   Collection Time: 06/28/24  8:27 AM  Result Value Ref Range   Sodium 140 135 - 145 mmol/L   Potassium 3.7 3.5 - 5.1 mmol/L   Chloride 105 98 - 111 mmol/L   CO2 23 22 - 32 mmol/L   Glucose, Bld 96 70 - 99 mg/dL    Comment: Glucose reference range  applies only to samples taken after fasting for at least 8 hours.   BUN 6 6 - 20 mg/dL   Creatinine 9.05 9.55 - 1.00 mg/dL   Calcium  9.1 8.9 - 10.3 mg/dL   Total Protein 6.7 6.5 - 8.1 g/dL   Albumin 3.8 3.5 - 5.0 g/dL   AST 23 15 - 41 U/L   ALT 21 0 - 44 U/L   Alkaline Phosphatase 70 38 - 126 U/L   Total Bilirubin 0.6 0.0 - 1.2 mg/dL   GFR, Estimated >39 >39 mL/min    Comment: (NOTE) Calculated using the CKD-EPI Creatinine Equation (2021)    Anion gap 12 5 - 15    Comment: Performed at [facility], [address] [place], [place] [address]  CBC with Differential (Cancer Center Only)     Status: Abnormal   Collection Time: 06/28/24  8:27 AM  Result Value Ref Range   WBC Count 1.8 (L) 4.0 - 10.5 K/uL   RBC 3.98 3.87 - 5.11 MIL/uL   Hemoglobin 9.6 (L) 12.0 - 15.0 g/dL    Comment: Reticulocyte Hemoglobin testing may be clinically indicated, consider ordering this additional test [id number]    HCT 29.4 (L) 36.0 - 46.0 %   MCV 73.9 (L) 80.0 - 100.0 fL   MCH 24.1 (L) 26.0 - 34.0 pg   MCHC 32.7 30.0 - 36.0 g/dL   RDW 84.5 88.4 - 84.4 %   Platelet Count 110 (L) 150 - 400 K/uL   nRBC 0.0 0.0 - 0.2 %   Neutrophils Relative % 22 %   Neutro Abs 0.4 (LL) 1.7 - 7.7 K/uL    Comment: REPEATED TO VERIFY This critical result has been called to SANTOS,E by Cyndee Arenas on 06/28/2024  09:07:25, and has been read back.    Lymphocytes Relative 61 %   Lymphs Abs 1.1 0.7 - 4.0 K/uL   Monocytes Relative 16 %   Monocytes Absolute 0.3 0.1 - 1.0 K/uL   Eosinophils Relative 1 %   Eosinophils Absolute 0.0 0.0 - 0.5 K/uL   Basophils Relative 0 %   Basophils Absolute 0.0 0.0 - 0.1 K/uL   Immature Granulocytes 0 %   Abs Immature Granulocytes 0.00 0.00 - 0.07 K/uL    Comment: Performed at [facility], [address] [place], [place] [address]  Pregnancy, urine     Status: None   Collection Time: 06/28/24  8:27 AM  Result Value Ref Range   Preg Test, Ur NEGATIVE NEGATIVE    Comment: Performed at [facility], [address] [place], [place] [address]  CBC     Status: Abnormal   Collection Time: 06/30/24  6:35 PM  Result Value Ref Range   WBC 6.7 4.0 - 10.5 K/uL   RBC 4.34 3.87 - 5.11 MIL/uL   Hemoglobin 10.6 (L) 12.0 - 15.0 g/dL   HCT 67.8 (L) 63.9 - 53.9 %   MCV 74.0 (L) 80.0 - 100.0 fL   MCH 24.4 (L) 26.0 - 34.0 pg   MCHC 33.0 30.0 - 36.0 g/dL   RDW 83.0 (H) 88.4 - 84.4 %   Platelets 161 150 - 400 K/uL   nRBC 0.4 (H) 0.0 - 0.2 %    Comment: Performed at [facility], [address] [place], [place] [address]  Comprehensive metabolic panel     Status: Abnormal   Collection Time: 06/30/24  6:35 PM  Result Value Ref Range   Sodium 139 135 - 145 mmol/L  Potassium 3.8 3.5 - 5.1 mmol/L   Chloride 101 98 - 111 mmol/L   CO2 23 22 - 32 mmol/L   Glucose, Bld 88 70 - 99 mg/dL    Comment: Glucose reference range applies only to samples taken after fasting for at least 8 hours.   BUN 6 6 - 20 mg/dL   Creatinine, Ser 8.97 (H) 0.44 - 1.00 mg/dL   Calcium  8.9 8.9 - 10.3 mg/dL   Total Protein 6.7 6.5 - 8.1 g/dL   Albumin 3.8 3.5 - 5.0 g/dL   AST 31 15 - 41 U/L   ALT 25 0 - 44 U/L   Alkaline Phosphatase 85 38 - 126 U/L   Total Bilirubin 0.7 0.0 - 1.2 mg/dL   GFR, Estimated >39 >39 mL/min    Comment: (NOTE) Calculated using the CKD-EPI Creatinine  Equation (2021)    Anion gap 15 5 - 15    Comment: Performed at [facility], [address] [place], [place] [address]  hCG, quantitative, pregnancy     Status: None   Collection Time: 06/30/24  6:35 PM  Result Value Ref Range   hCG, Beta Chain, Quant, S <1 <5 mIU/mL    Comment:          GEST. AGE      CONC.  (mIU/mL)   <=1 WEEK        5 - 50     2 WEEKS       50 - 500     3 WEEKS       100 - 10,000     4 WEEKS     1,000 - 30,000     5 WEEKS     3,500 - 115,000   6-8 WEEKS     12,000 - 270,000    12 WEEKS     15,000 - 220,000        FEMALE AND NON-PREGNANT FEMALE:     LESS THAN 5 mIU/mL Performed at [facility], [address] [place], [place] [address]   Troponin T, High Sensitivity     Status: None   Collection Time: 06/30/24  6:35 PM  Result Value Ref Range   Troponin T High Sensitivity <15 0 - 19 ng/L    Comment: (NOTE) Biotin concentrations > 1000 ng/mL falsely decrease TnT results.  Serial cardiac troponin measurements are suggested.  Refer to the Links section for chest pain algorithms and additional  guidance. Performed at [facility], [address] [place], [place] [address]   Pro Brain natriuretic peptide     Status: None   Collection Time: 06/30/24  6:35 PM  Result Value Ref Range   Pro Brain Natriuretic Peptide 87.6 <300.0 pg/mL    Comment: (NOTE) Age Group        Cut-Points    Interpretation  < 50 years     450 pg/mL       NT-proBNP > 450 pg/mL indicates                                ADHF is likely              50 to 75 years  900 pg/mL      NT-proBNP > 900 pg/mL indicates          ADHF is likely  > 75 years      1800 pg/mL  NT-proBNP > 1800 pg/mL indicates          ADHF is likely                           All ages    Results between       Indeterminate. Further clinical             300 and the cut-   information is needed to determine            point for age group   if ADHF is present.                                                              Elecsys proBNP II/ Elecsys proBNP II STAT           Cut-Point                       Interpretation  300 pg/mL                    NT-proBNP <300pg/mL indicates                             ADHF is not likely  Performed at [facility], [address] [place], [place] [address]   APTT     Status: None   Collection Time: 06/30/24  6:35 PM  Result Value Ref Range   aPTT 26 24 - 36 seconds    Comment: Performed at [facility], [address] [place], [place] [address]  Protime-INR     Status: None   Collection Time: 06/30/24  6:35 PM  Result Value Ref Range   Prothrombin Time 13.6 11.4 - 15.2 seconds   INR 1.0 0.8 - 1.2    Comment: (NOTE) INR goal varies based on device and disease states. Performed at [facility], [address] [place], [place] [address]   CBC     Status: Abnormal   Collection Time: 07/01/24  7:10 AM  Result Value Ref Range   WBC 13.3 (H) 4.0 - 10.5 K/uL   RBC 4.31 3.87 - 5.11 MIL/uL   Hemoglobin 10.4 (L) 12.0 - 15.0 g/dL   HCT 68.0 (L) 63.9 - 53.9 %   MCV 74.0 (L) 80.0 - 100.0 fL   MCH 24.1 (L) 26.0 - 34.0 pg   MCHC 32.6 30.0 - 36.0 g/dL   RDW 82.7 (H) 88.4 - 84.4 %   Platelets 159 150 - 400 K/uL   nRBC 0.5 (H) 0.0 - 0.2 %    Comment: Performed at [facility], [address] [place], [place] [address]  Heparin  level (unfractionated)     Status: Abnormal   Collection Time: 07/01/24  7:10 AM  Result Value Ref Range   Heparin  Unfractionated 0.82 (H) 0.30 - 0.70 IU/mL    Comment: (NOTE) The clinical reportable range upper limit is being lowered to >1.10 to align with the FDA approved guidance for the current laboratory assay.  If heparin  results are below expected values, and patient dosage has  been confirmed, suggest follow up testing of antithrombin III levels. Performed at [facility], [address] [place], [place] [address]   HIV Antibody (routine  testing w rflx)     Status: None   Collection Time: 07/01/24  7:10 AM  Result Value Ref Range   HIV Screen 4th Generation wRfx Non Reactive Non Reactive    Comment: Performed at [facility], [address], [place], [place] [address]      Assessment & Plan:  Continue meds.  Cardiology referral.  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 (HCC)     Other   Mixed hyperlipidemia   Relevant Medications   metoprolol  succinate (TOPROL  XL) 25 MG 24 hr tablet   Other Relevant Orders   Lipid Panel w/o Chol/HDL Ratio   Prediabetes   Relevant Orders   Hemoglobin A1c   Other Visit Diagnoses       Vitamin D  deficiency       Relevant Orders   Vitamin D  (25 hydroxy)     Sinus tachycardia       Relevant Medications   metoprolol  succinate (TOPROL  XL) 25 MG 24 hr tablet   Other Relevant Orders   Magnesium   EKG 12-Lead     Abnormal EKG       Relevant Orders   Ambulatory referral to Cardiology       Return in about 3 months (around 10/10/2024).   Total time spent: 30 minutes. This time includes review of previous notes and results and patient face to face interaction during today's visit.    FERNAND FREDY RAMAN, MD  07/12/2024   This document may have been prepared by [facility] Voice Recognition software and as such may include unintentional dictation errors.     [1]  Allergies Allergen Reactions   Tape Rash    Port tape she has reaction .   [2]  Outpatient Medications Prior to Visit  Medication Sig   amLODipine  (NORVASC ) 5 MG tablet Take 1 tablet (5 mg total) by mouth daily.    [START ON 07/31/2024] apixaban  (ELIQUIS ) 5 MG TABS tablet Take 1 tablet (5 mg total) by mouth 2 (two) times daily.   Cholecalciferol (VITAMIN D3) 1.25 MG (50000 UT) CAPS TAKE 1 CAPSULE BY MOUTH ONE TIME PER WEEK   dexamethasone  (DECADRON ) 4 MG tablet Take 2 tablets (8 mg total) by mouth daily. Start the day after chemotherapy for 2 days. Take with food.   lidocaine -prilocaine  (EMLA ) cream Apply to affected area once   LORazepam  (ATIVAN ) 0.5 MG tablet Take 1 tablet (0.5 mg total) by mouth every 12 (twelve) hours as needed for anxiety or sleep (nausea).   ondansetron  (ZOFRAN ) 8 MG tablet Take 1 tablet (8 mg total) by mouth every 8 (eight) hours as needed for nausea or vomiting. Start on the third day after chemotherapy.   prochlorperazine  (COMPAZINE ) 10 MG tablet Take 1 tablet (10 mg total) by mouth every 6 (six) hours as needed for nausea or vomiting.   valACYclovir  (VALTREX ) 500 MG tablet Take 1 tablet (500 mg total) by mouth daily.   [DISCONTINUED] apixaban  (ELIQUIS ) 5 MG TABS tablet Take 5 mg  by mouth 2 (two) times daily.   folic acid (FOLVITE) 1 MG tablet Take 1 mg by mouth daily. (Patient not taking: Reported on 07/12/2024)   rosuvastatin  (CRESTOR ) 20 MG tablet TAKE 1 TABLET BY MOUTH EVERY DAY (Patient not taking: Reported on 07/12/2024)   [DISCONTINUED] APIXABAN  (ELIQUIS ) VTE STARTER PACK (10MG  AND 5MG ) Take as directed on package: start with two-5mg  tablets twice daily for 7 days. On day 8, switch to one-5mg  tablet twice daily. (Patient not taking: Reported on 07/12/2024)   [DISCONTINUED] JUNEL  FE 1/20 1-20 MG-MCG tablet TAKE 1 TABLET BY MOUTH EVERY DAY (Patient not taking: Reported on 07/12/2024)   No facility-administered medications prior to visit.   "

## 2024-07-13 ENCOUNTER — Inpatient Hospital Stay: Admitting: Oncology

## 2024-07-13 ENCOUNTER — Encounter: Payer: Self-pay | Admitting: Oncology

## 2024-07-13 ENCOUNTER — Inpatient Hospital Stay

## 2024-07-13 ENCOUNTER — Other Ambulatory Visit: Payer: Self-pay

## 2024-07-13 VITALS — BP 134/81 | HR 106 | Temp 96.9°F | Resp 18

## 2024-07-13 DIAGNOSIS — T451X5A Adverse effect of antineoplastic and immunosuppressive drugs, initial encounter: Secondary | ICD-10-CM

## 2024-07-13 DIAGNOSIS — C801 Malignant (primary) neoplasm, unspecified: Secondary | ICD-10-CM

## 2024-07-13 DIAGNOSIS — D509 Iron deficiency anemia, unspecified: Secondary | ICD-10-CM | POA: Diagnosis not present

## 2024-07-13 DIAGNOSIS — C180 Malignant neoplasm of cecum: Secondary | ICD-10-CM | POA: Diagnosis not present

## 2024-07-13 DIAGNOSIS — F411 Generalized anxiety disorder: Secondary | ICD-10-CM | POA: Diagnosis not present

## 2024-07-13 DIAGNOSIS — Z5111 Encounter for antineoplastic chemotherapy: Secondary | ICD-10-CM

## 2024-07-13 DIAGNOSIS — C787 Secondary malignant neoplasm of liver and intrahepatic bile duct: Secondary | ICD-10-CM

## 2024-07-13 DIAGNOSIS — D701 Agranulocytosis secondary to cancer chemotherapy: Secondary | ICD-10-CM

## 2024-07-13 LAB — CMP (CANCER CENTER ONLY)
ALT: 30 U/L (ref 0–44)
AST: 31 U/L (ref 15–41)
Albumin: 4 g/dL (ref 3.5–5.0)
Alkaline Phosphatase: 80 U/L (ref 38–126)
Anion gap: 11 (ref 5–15)
BUN: 10 mg/dL (ref 6–20)
CO2: 25 mmol/L (ref 22–32)
Calcium: 9.4 mg/dL (ref 8.9–10.3)
Chloride: 102 mmol/L (ref 98–111)
Creatinine: 0.88 mg/dL (ref 0.44–1.00)
GFR, Estimated: >60 mL/min
Glucose, Bld: 98 mg/dL (ref 70–99)
Potassium: 4.1 mmol/L (ref 3.5–5.1)
Sodium: 138 mmol/L (ref 135–145)
Total Bilirubin: 0.4 mg/dL (ref 0.0–1.2)
Total Protein: 7.1 g/dL (ref 6.5–8.1)

## 2024-07-13 LAB — CBC WITH DIFFERENTIAL (CANCER CENTER ONLY)
Abs Immature Granulocytes: 0.03 K/uL (ref 0.00–0.07)
Basophils Absolute: 0 K/uL (ref 0.0–0.1)
Basophils Relative: 1 %
Eosinophils Absolute: 0 K/uL (ref 0.0–0.5)
Eosinophils Relative: 0 %
HCT: 30.1 % — ABNORMAL LOW (ref 36.0–46.0)
Hemoglobin: 9.7 g/dL — ABNORMAL LOW (ref 12.0–15.0)
Immature Granulocytes: 1 %
Lymphocytes Relative: 26 %
Lymphs Abs: 1.5 K/uL (ref 0.7–4.0)
MCH: 24.4 pg — ABNORMAL LOW (ref 26.0–34.0)
MCHC: 32.2 g/dL (ref 30.0–36.0)
MCV: 75.6 fL — ABNORMAL LOW (ref 80.0–100.0)
Monocytes Absolute: 0.4 K/uL (ref 0.1–1.0)
Monocytes Relative: 7 %
Neutro Abs: 3.8 K/uL (ref 1.7–7.7)
Neutrophils Relative %: 65 %
Platelet Count: 258 K/uL (ref 150–400)
RBC: 3.98 MIL/uL (ref 3.87–5.11)
RDW: 18.6 % — ABNORMAL HIGH (ref 11.5–15.5)
WBC Count: 5.7 K/uL (ref 4.0–10.5)
nRBC: 0 % (ref 0.0–0.2)

## 2024-07-13 LAB — LIPID PANEL W/O CHOL/HDL RATIO
Cholesterol, Total: 252 mg/dL — ABNORMAL HIGH (ref 100–199)
HDL: 64 mg/dL
LDL Chol Calc (NIH): 158 mg/dL — ABNORMAL HIGH (ref 0–99)
Triglycerides: 168 mg/dL — ABNORMAL HIGH (ref 0–149)
VLDL Cholesterol Cal: 30 mg/dL (ref 5–40)

## 2024-07-13 LAB — PREGNANCY, URINE: Preg Test, Ur: NEGATIVE

## 2024-07-13 LAB — HEMOGLOBIN A1C
Est. average glucose Bld gHb Est-mCnc: 134 mg/dL
Hgb A1c MFr Bld: 6.3 % — ABNORMAL HIGH (ref 4.8–5.6)

## 2024-07-13 LAB — MAGNESIUM: Magnesium: 1.9 mg/dL (ref 1.6–2.3)

## 2024-07-13 LAB — VITAMIN D 25 HYDROXY (VIT D DEFICIENCY, FRACTURES): Vit D, 25-Hydroxy: 23.9 ng/mL — ABNORMAL LOW (ref 30.0–100.0)

## 2024-07-13 MED ORDER — DEXTROSE 5 % IV SOLN
INTRAVENOUS | Status: DC
  Filled 2024-07-13: qty 250

## 2024-07-13 MED ORDER — SODIUM CHLORIDE 0.9 % IV SOLN
2400.0000 mg/m2 | INTRAVENOUS | Status: DC
  Administered 2024-07-13: 5000 mg via INTRAVENOUS
  Filled 2024-07-13: qty 100

## 2024-07-13 MED ORDER — PALONOSETRON HCL INJECTION 0.25 MG/5ML
0.2500 mg | Freq: Once | INTRAVENOUS | Status: AC
  Administered 2024-07-13: 0.25 mg via INTRAVENOUS
  Filled 2024-07-13: qty 5

## 2024-07-13 MED ORDER — LORAZEPAM 2 MG/ML IJ SOLN: 0.5000 mg | Freq: Once | INTRAMUSCULAR | Status: DC | PRN

## 2024-07-13 MED ORDER — FLUOROURACIL CHEMO INJECTION 2.5 GM/50ML
400.0000 mg/m2 | Freq: Once | INTRAVENOUS | Status: AC
  Administered 2024-07-13: 900 mg via INTRAVENOUS
  Filled 2024-07-13: qty 18

## 2024-07-13 MED ORDER — DEXAMETHASONE SOD PHOSPHATE PF 10 MG/ML IJ SOLN
10.0000 mg | Freq: Once | INTRAMUSCULAR | Status: AC
  Administered 2024-07-13: 10 mg via INTRAVENOUS

## 2024-07-13 MED ORDER — OXALIPLATIN CHEMO INJECTION 100 MG/20ML
85.0000 mg/m2 | Freq: Once | INTRAVENOUS | Status: AC
  Administered 2024-07-13: 200 mg via INTRAVENOUS
  Filled 2024-07-13: qty 40

## 2024-07-13 MED ORDER — LEUCOVORIN CALCIUM INJECTION 350 MG
400.0000 mg/m2 | Freq: Once | INTRAVENOUS | Status: AC
  Administered 2024-07-13: 896 mg via INTRAVENOUS
  Filled 2024-07-13: qty 44.8

## 2024-07-13 NOTE — Assessment & Plan Note (Signed)
 Previously she declined liver lesion biopsy. She will repeat imaging at [facility] in the future for evaluation of treatment response.

## 2024-07-13 NOTE — Assessment & Plan Note (Addendum)
 Primary cecal adenocarcinoma, ileocolic mesenteric adenopathy and liver lesions. Baseline CEA was elevated at 32.8. pMMR  2nd opinion recommendation at [facility] was reviewed.  Recommend liver lesion biopsy patient declined.  Recommend molecular profiling- NGS testing. She has no desire of fertility preservation.  Recommend genetic testing-patient reports that she has a genetic appointment at [facility] in Feb 2026  Labs are reviewed and discussed with patient. Proceed with cycle 3 FOLFOX with day 3 pump DC

## 2024-07-13 NOTE — Assessment & Plan Note (Signed)
"   Continue Eliquis  5 mg twice daily  "

## 2024-07-13 NOTE — Assessment & Plan Note (Signed)
 She will get long acting G-CSF on D3. Rationale and side effects were reviewed with patient.  She will take Claritin 10 mg daily for 4 days.

## 2024-07-13 NOTE — Progress Notes (Signed)
 " Hematology/Oncology Progress note [phone] [phone]     CHIEF COMPLAINTS/PURPOSE OF CONSULTATION:  Cecal adenocarcinoma liver lesions   ASSESSMENT & PLAN:   Cancer Staging  Primary cancer of cecum [facility]) Staging form: Colon and Rectum, AJCC 8th Edition - Clinical stage from 04/15/2024: cT2, cN1, cM1 - Signed by Babara Call, MD on 05/31/2024   Primary cancer of cecum [facility]) Primary cecal adenocarcinoma, ileocolic mesenteric adenopathy and liver lesions. Baseline CEA was elevated at 32.8. pMMR  2nd opinion recommendation at [facility] was reviewed.  Recommend liver lesion biopsy patient declined.  Recommend molecular profiling- NGS testing. She has no desire of fertility preservation.  Recommend genetic testing-patient reports that she has a genetic appointment at [facility] in Feb 2026  Labs are reviewed and discussed with patient. Proceed with cycle 3 FOLFOX with day 3 pump DC   Anxiety associated with cancer diagnosis (HCC) Lorazepam  0.5mg  Q12 hours PRN anxiety, nausea sleep.  She currently does not use often.   Chemotherapy induced neutropenia She will get long acting G-CSF on D3. Rationale and side effects were reviewed with patient.  She will take Claritin 10 mg daily for 4 days.   Encounter for antineoplastic chemotherapy Chemotherapy treatment as listed above  Iron deficiency anemia Lab Results  Component Value Date   HGB 9.7 (L) 07/13/2024   TIBC 377 04/08/2024   IRONPCTSAT 5 (L) 04/08/2024   FERRITIN 63 04/08/2024    She takes oral iron supplementation. IDA is due to chronic blood loss, GI and heavy menstrual period.  Option of proceed with IV Venofer was discussed. . I discussed about the potential risks including but not limited to allergic reactions/infusion reactions including anaphylactic reactions, diarrhea, phlebitis, high blood pressure, wheezing, SOB, skin rash, weight gain,dark urine, leg swelling, back pain, headache, nausea and fatigue,  etc. Patient is undecided and would like to continue iron supplementation for now.    [facility]ast[facility]) Previously she declined liver lesion biopsy. She will repeat imaging at [facility] in the future for evaluation of treatment response.   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    Orders Placed This Encounter  Procedures   CBC with Differential (Cancer Center Only)    Standing Status:   Future    Expected Date:   08/10/2024    Expiration Date:   08/10/2025   CMP (Cancer Center only)    Standing Status:   Future    Expected Date:   08/10/2024    Expiration Date:   08/10/2025   Follow-up 2 weeks  All questions were answered. The patient knows to call the clinic with any problems, questions or concerns.  Call Babara, MD, PhD [facility] Hematology Oncology 07/13/2024    HISTORY OF PRESENTING ILLNESS:  Amber Price 43 y.o. female presents to establish care for stage IV cecal adenocarcinoma I have reviewed her chart and materials related to her cancer extensively and collaborated history with the patient. Summary of oncologic history is as follows: Oncology History  Primary cancer of cecum (HCC)  04/06/2024 Imaging   CT abdomen pelvis with contrast showed 1. Asymmetric wall thickening and enhancement in the cecum, measuring 4.3 cm in craniocaudal dimension, and involving the appendiceal orifice (axial 50, sagittal 61), worrisome for underlying colonic neoplasm. Likely causing obstruction of the appendix, which is fluid-filled and dilated appendix, suggesting acute appendicitis. Surgical consultation recommended. 2. A couple of mildly prominent right ileocolic chain lymph nodes are present measuring  up to 7 mm (axial 50-52). These could be reactive or metastatic. 3. There are 3 hypodense lesions in both lobes of the liver, measuring up to 1.8 cm, likely metastatic disease.     04/06/2024 Imaging   CT  chest angiogram No evidence of pulm embolism or other acute intrathoracic process.   04/07/2024 Imaging   MRI of liver with and without contrast showed 1. Multiple bilateral liver lesions. 2 left hepatic lobe lesions are highly suspicious for metastasis. 1 right liver lobe lesion is indeterminate but favored to represent a hemangioma. 2 smaller right liver lobe lesions are favored to be benign, likely hemangiomas. Consider sampling of the segment 2 lesion versus further evaluation with PET. 2.  Cecal mass with adjacent adenopathy, suspicious for nodal metastasis.  04/08/2024, colonoscopy showed fungating partially obstructing large mass in the cecum. Biopsy pathology showed invasive moderately differentiated adenocarcinoma. pMMR    04/15/2024 Initial Diagnosis   Primary cancer of cecum   Patient presented to emergency room for evaluation of UTI symptoms.  She was found to have sepsis secondary to E. coli bacteremia, acute UTI.  Urine culture grew pansensitive E. coli.  Patient was treated with IV antibiotics and discharged on oral Levaquin .  CT obtained during hospitalization showed Asymmetric wall thickening and enhancement in the cecum , measuring 4.3 cm worrisome for underlying colonic neoplasm.  There are 3 hypodense liver lesions in both lobes of the liver.  Mildly prominent right ileocolic chain lymph nodes. MRI liver showed multiple bilateral liver lesions.    04/08/2024 colonoscopy showed Likely cecal primary with hepatic metastases. No other sites of metabolically active disease are seen. The prominent mesenteric nodes are visible on noncontrast CT but are not significantly avid above background.   Pathology showed invasive moderately differentiated adenocarcinoma.  pMMR Tempus liquid biopsy showed KRAS pG12V    04/15/2024 Cancer Staging   Staging form: Colon and Rectum, AJCC 8th Edition - Clinical stage from 04/15/2024: cT2, cN1, cM1 - Signed by Babara Call, MD on 05/31/2024 Stage  prefix: Initial diagnosis Total positive nodes: 1   04/28/2024 Imaging   PET scan showed Liver: Lesions described in the liver in segments 2 and 4B (SUV max 14.1, CT 129 and 7.3, CT 140). The other lesion in the anterior right hepatic lobe is not metabolically active. The lesions described as likely hemangiomas do not show uptake above background.   Likely cecal primary with hepatic metastases. No other sites of metabolically active disease are seen. The prominent mesenteric nodes are visible on noncontrast CT but are not significantly avid above background.      05/31/2024 -  Chemotherapy   Patient is on Treatment Plan : COLORECTAL FOLFOX q14d x 3 months      Patient denies family history of colon cancer.  Paternal grandmother had lung cancer. She denies any rectal bleeding, melena or change of bowel habits.  Patient is on methotrexate for rheumatoid arthritis. Denies unintentional weight loss, fever, night sweats.  Patient presents to discuss management plan.  Accompanied by her colleague.  She was seen by [place] Dr. Jia. PET scan was done. She was recommended for liver lesion biopsy and she declined.  She reports having stopped all her medication. No longer on Methotrexate.   She is prescribed Valtrex  for herpes prophylaxis but has been taking it as needed rather than daily.  INTERVAL HISTORY Shakina Choy is a 43 y.o. 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   Discussed the use of AI scribe software for clinical note transcription with the patient, who gave verbal consent to proceed.   S/p 2 cycles of FOLFOX  06/30/2024 CT chest angiogram showed Bilateral lower lobe segmental and subsegmental pulmonary emboli. Developing peripheral left lower lobe pulmonary infarctions Bilateral lower extremity ultrasound showed no DVT. Patient was started on anticoagulation and transition  to Eliquis  at discharge. Patient reports feeling well.  She has had heavy menstrual period since start of the Eliquis . Denies shortness of breath chest pain today. Patient was seen by primary care provider Dr. Nelida recently.  She was referred to cardiology for evaluate of tachycardia.  She was started on metoprolol .   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 RECK 2018 ANNUAL   Sickle cell trait    Vitamin D  deficiency 01/2016   ADD VIT D3 5000 IU DAILY    SURGICAL HISTORY: Past Surgical History:  Procedure Laterality Date   COLONOSCOPY N/A 04/08/2024   Procedure: COLONOSCOPY;  Surgeon: Jinny Carmine, MD;  Location: [facility] ENDOSCOPY;  Service: Endoscopy;  Laterality: N/A;   IR IMAGING GUIDED PORT INSERTION  05/26/2024   NO PAST SURGERIES      SOCIAL HISTORY: Social History   Socioeconomic History   Marital status: Single    Spouse name: Not on file   Number of children: 3   Years of education: 14   Highest education level: Not on file  Occupational History   Occupation: BUSINESS OFFICE SPECIALIST  Tobacco Use   Smoking status: Never   Smokeless tobacco: Never  Vaping Use   Vaping status: Never Used  Substance and Sexual Activity   Alcohol use: Not Currently   Drug use: No   Sexual activity: Yes    Birth control/protection: Pill  Other Topics Concern   Not on file  Social History Narrative   Not on file   Social Drivers of Health   Tobacco Use: Low Risk (07/13/2024)   Patient History    Smoking Tobacco Use: Never    Smokeless Tobacco Use: Never    Passive Exposure: Not on file  Financial Resource Strain: Low Risk (04/19/2024)   Received from [facility]   Overall Financial Resource  Strain (CARDIA)    How hard is it for you to pay for the very basics like food, housing, medical care, and heating?: Not very hard  Recent Concern: Financial Resource Strain - Medium Risk (04/06/2024)   Received from [facility]   Overall Financial Resource Strain (CARDIA)    Difficulty of Paying Living Expenses: Somewhat hard  Food Insecurity: No Food Insecurity (04/19/2024)   Received from [facility]   Epic    Within the past 12 months, you worried that your food would run out before you got the money to buy more.: Never true    Within the past 12 months, the food you bought just didn't last and you didn't have money to get more.: Never true  Recent Concern: Food Insecurity - Food Insecurity Present (04/12/2024)   Epic    Worried About  Running Out of Food in the Last Year: Sometimes true    Ran Out of Food in the Last Year: Never true  Transportation Needs: No Transportation Needs (04/19/2024)   Received from [facility] - [facility]    Lack of Transportation (Medical): No    Lack of Transportation (Non-Medical): No  Physical Activity: Not on file  Stress: Not on file  Social Connections: Not on file  Intimate Partner Violence: Not At Risk (04/15/2024)   Epic    Fear of Current or Ex-Partner: No    Emotionally Abused: No    Physically Abused: No    Sexually Abused: No  Depression (PHQ2-9): Low Risk (07/13/2024)   Depression (PHQ2-9)    PHQ-2 Score: 0  Alcohol Screen: Not on file  Housing: Low Risk (04/15/2024)   Epic    Unable to Pay for Housing in the Last Year: No    Number of Times Moved in the Last Year: 0    Homeless in the Last Year: No  Utilities: Low Risk (04/19/2024)   Received from [facility]   Utilities    Within the past 12 months, have you been unable to get utilities(heat, electricity) when it was really needed?: No  Recent Concern: Utilities - At Risk (04/06/2024)   Received from [facility]   Epic     In the past 12 months has the electric, gas, oil, or water  company threatened to shut off services in your home?: Yes  Health Literacy: Low Risk (04/19/2024)   Received from [facility] Literacy    How often do you need to have someone help you when you read instructions, pamphlets, or other written material from your doctor or pharmacy?: Never    FAMILY HISTORY: Family History  Problem Relation Age of Onset   Diabetes Mellitus II Mother    Hypertension Mother    Hypercholesterolemia Father    Cancer Paternal Grandmother 48       LUNG   Sickle cell trait Son     ALLERGIES:  is allergic to tape.  MEDICATIONS:  Current Outpatient Medications  Medication Sig Dispense Refill   amLODipine  (NORVASC ) 5 MG tablet Take 1 tablet (5 mg total) by mouth daily. 30 tablet 11   [START ON 07/31/2024] apixaban  (ELIQUIS ) 5 MG TABS tablet Take 1 tablet (5 mg total) by mouth 2 (two) times daily. 60 tablet 4   Cholecalciferol (VITAMIN D3) 1.25 MG (50000 UT) CAPS TAKE 1 CAPSULE BY MOUTH ONE TIME PER WEEK 12 capsule 3   dexamethasone  (DECADRON ) 4 MG tablet Take 2 tablets (8 mg total) by mouth daily. Start the day after chemotherapy for 2 days. Take with food. 30 tablet 1   lidocaine -prilocaine  (EMLA ) cream Apply to affected area once 30 g 3   LORazepam  (ATIVAN ) 0.5 MG tablet Take 1 tablet (0.5 mg total) by mouth every 12 (twelve) hours as needed for anxiety or sleep (nausea). 60 tablet 0   metoprolol  succinate (TOPROL  XL) 25 MG 24 hr tablet Take 1 tablet (25 mg total) by mouth daily. 30 tablet 11   ondansetron  (ZOFRAN ) 8 MG tablet Take 1 tablet (8 mg total) by mouth every 8 (eight) hours as needed for nausea or vomiting. Start on the third day after chemotherapy. 30 tablet 1   prochlorperazine  (COMPAZINE ) 10 MG tablet Take 1 tablet (10 mg total) by mouth every 6 (six) hours as needed for nausea or vomiting. 30 tablet 1  valACYclovir  (VALTREX ) 500 MG tablet Take 1 tablet (500 mg total) by mouth  daily. 90 tablet 3   folic acid (FOLVITE) 1 MG tablet Take 1 mg by mouth daily. (Patient not taking: Reported on 07/13/2024)     rosuvastatin  (CRESTOR ) 20 MG tablet TAKE 1 TABLET BY MOUTH EVERY DAY (Patient not taking: Reported on 07/13/2024) 90 tablet 3   No current facility-administered medications for this visit.    Review of Systems  Constitutional:  Negative for appetite change, chills, fatigue and fever.  HENT:   Negative for hearing loss and voice change.   Eyes:  Negative for eye problems.  Respiratory:  Negative for chest tightness and cough.   Cardiovascular:  Negative for chest pain.  Gastrointestinal:  Negative for abdominal distention, abdominal pain and blood in stool.  Endocrine: Negative for hot flashes.  Genitourinary:  Negative for difficulty urinating and frequency.   Musculoskeletal:  Negative for arthralgias.  Skin:  Negative for itching and rash.  Neurological:  Negative for extremity weakness.  Hematological:  Negative for adenopathy.  Psychiatric/Behavioral:  Negative for confusion and sleep disturbance. The patient is nervous/anxious.      PHYSICAL EXAMINATION: ECOG PERFORMANCE STATUS: 0 - Asymptomatic  Vitals:   07/13/24 0906  BP: 134/81  Pulse: (!) 106  Resp: 18  Temp: (!) 96.9 F (36.1 C)  SpO2: 98%   There were no vitals filed for this visit.   Physical Exam Constitutional:      General: She is not in acute distress.    Appearance: She is not diaphoretic.  HENT:     Head: Normocephalic and atraumatic.  Eyes:     General: No scleral icterus. Cardiovascular:     Rate and Rhythm: Regular rhythm. Tachycardia present.  Pulmonary:     Effort: Pulmonary effort is normal. No respiratory distress.  Abdominal:     General: Bowel sounds are normal. There is no distension.     Palpations: Abdomen is soft.  Musculoskeletal:        General: Normal range of motion.     Cervical back: Normal range of motion and neck supple.  Skin:    Findings: No  erythema.  Neurological:     Mental Status: She is alert and oriented to person, place, and time. Mental status is at baseline.     Cranial Nerves: No cranial nerve deficit.     Motor: No abnormal muscle tone.  Psychiatric:        Mood and Affect: Mood and affect normal.      LABORATORY DATA:  I have reviewed the data as listed    Latest Ref Rng & Units 07/13/2024    8:59 AM 07/01/2024    7:10 AM 06/30/2024    6:35 PM  CBC  WBC 4.0 - 10.5 K/uL 5.7  13.3  6.7   Hemoglobin 12.0 - 15.0 g/dL 9.7  89.5  89.3   Hematocrit 36.0 - 46.0 % 30.1  31.9  32.1   Platelets 150 - 400 K/uL 258  159  161       Latest Ref Rng & Units 07/13/2024    8:59 AM 06/30/2024    6:35 PM 06/28/2024    8:27 AM  CMP  Glucose 70 - 99 mg/dL 98  88  96   BUN 6 - 20 mg/dL 10  6  6    Creatinine 0.44 - 1.00 mg/dL 9.11  8.97  9.05   Sodium 135 - 145 mmol/L 138  139  140  Potassium 3.5 - 5.1 mmol/L 4.1  3.8  3.7   Chloride 98 - 111 mmol/L 102  101  105   CO2 22 - 32 mmol/L 25  23  23    Calcium  8.9 - 10.3 mg/dL 9.4  8.9  9.1   Total Protein 6.5 - 8.1 g/dL 7.1  6.7  6.7   Total Bilirubin 0.0 - 1.2 mg/dL 0.4  0.7  0.6   Alkaline Phos 38 - 126 U/L 80  85  70   AST 15 - 41 U/L 31  31  23    ALT 0 - 44 U/L 30  25  21       RADIOGRAPHIC STUDIES: I have personally reviewed the radiological images as listed and agreed with the findings in the report. US  Venous Img Lower Bilateral (DVT) Result Date: 06/30/2024 EXAM: ULTRASOUND DUPLEX OF THE BILATERAL LOWER EXTREMITY VEINS TECHNIQUE: Duplex ultrasound using B-mode/gray scaled imaging and Doppler spectral analysis and color flow was obtained of the deep venous structures of the bilateral lower extremity. COMPARISON: None available. CLINICAL HISTORY: 449891 Acute pulmonary embolism (HCC) 449891 Acute pulmonary embolism (HCC) FINDINGS: The common femoral vein, femoral vein, popliteal vein, and posterior tibial vein demonstrate normal compressibility with normal color flow  and spectral analysis. IMPRESSION: 1. No evidence of DVT. Electronically signed by: Oneil Devonshire MD 06/30/2024 11:10 PM EST RP Workstation: [id number]   CT Angio Chest Pulmonary Embolism (PE) W or WO Contrast Result Date: 06/30/2024 EXAM: CTA CHEST 06/30/2024 03:59:59 PM TECHNIQUE: CTA of the chest was performed without and with the administration of 75 mL of iohexol  (OMNIPAQUE ) 350 MG/ML injection. Multiplanar reformatted images are provided for review. MIP images are provided for review. Automated exposure control, iterative reconstruction, and/or weight based adjustment of the mA/kV was utilized to reduce the radiation dose to as low as reasonably achievable. COMPARISON: None available. CLINICAL HISTORY: tachycardia FINDINGS: PULMONARY ARTERIES: Pulmonary arteries are adequately opacified for evaluation. Bilateral lower lobe segmental and subsegmental pulmonary emboli. Main pulmonary artery is normal in caliber. MEDIASTINUM: The heart and pericardium demonstrate no acute abnormality. No enlarged RV-LV ratio. There is no acute abnormality of the thoracic aorta. LYMPH NODES: No mediastinal, hilar or axillary lymphadenopathy. LUNGS AND PLEURA: Peripheral wedge-shaped airspace opacities as well as ground-glass opacities along the left lower lobe suggestive of developing peripheral left lower lobe pulmonary infarctions. No evidence of pleural effusion or pneumothorax. UPPER ABDOMEN: Limited images of the upper abdomen are unremarkable. SOFT TISSUES AND BONES: No acute bone or soft tissue abnormality. IMPRESSION: 1. Bilateral lower lobe segmental and subsegmental pulmonary emboli. Developing peripheral left lower lobe pulmonary infarctions. No right heart strain. PRA to call report. Electronically signed by: Morgane Naveau MD 06/30/2024 04:11 PM EST RP Workstation: [id number]      "

## 2024-07-13 NOTE — Assessment & Plan Note (Signed)
Chemotherapy treatment as listed above. 

## 2024-07-13 NOTE — Assessment & Plan Note (Signed)
 Lorazepam  0.5mg  Q12 hours PRN anxiety, nausea sleep.  She currently does not use often.

## 2024-07-13 NOTE — Assessment & Plan Note (Addendum)
 Lab Results  Component Value Date   HGB 9.7 (L) 07/13/2024   TIBC 377 04/08/2024   IRONPCTSAT 5 (L) 04/08/2024   FERRITIN 63 04/08/2024    She takes oral iron supplementation. IDA is due to chronic blood loss, GI and heavy menstrual period.  Option of proceed with IV Venofer was discussed. . I discussed about the potential risks including but not limited to allergic reactions/infusion reactions including anaphylactic reactions, diarrhea, phlebitis, high blood pressure, wheezing, SOB, skin rash, weight gain,dark urine, leg swelling, back pain, headache, nausea and fatigue, etc. Patient is undecided and would like to continue iron supplementation for now.

## 2024-07-13 NOTE — Patient Instructions (Signed)
 [facility] - A DEPT OF [facility]  Discharge Instructions: Thank you for choosing [facility] to provide your oncology and hematology care.  If you have a lab appointment with the Cancer Center, please go directly to the Cancer Center and check in at the registration area.  Wear comfortable clothing and clothing appropriate for easy access to any Portacath or PICC line.   We strive to give you quality time with your provider. You may need to reschedule your appointment if you arrive late (15 or more minutes).  Arriving late affects you and other patients whose appointments are after yours.  Also, if you miss three or more appointments without notifying the office, you may be dismissed from the clinic at the providers discretion.      For prescription refill requests, have your pharmacy contact our office and allow 72 hours for refills to be completed.    Today you received the following chemotherapy and/or immunotherapy agents OXALIPLATIN  and LEUCOVORIN , 5 FU      To help prevent nausea and vomiting after your treatment, we encourage you to take your nausea medication as directed.  BELOW ARE SYMPTOMS THAT SHOULD BE REPORTED IMMEDIATELY: *FEVER GREATER THAN 100.4 F (38 C) OR HIGHER *CHILLS OR SWEATING *NAUSEA AND VOMITING THAT IS NOT CONTROLLED WITH YOUR NAUSEA MEDICATION *UNUSUAL SHORTNESS OF BREATH *UNUSUAL BRUISING OR BLEEDING *URINARY PROBLEMS (pain or burning when urinating, or frequent urination) *BOWEL PROBLEMS (unusual diarrhea, constipation, pain near the anus) TENDERNESS IN MOUTH AND THROAT WITH OR WITHOUT PRESENCE OF ULCERS (sore throat, sores in mouth, or a toothache) UNUSUAL RASH, SWELLING OR PAIN  UNUSUAL VAGINAL DISCHARGE OR ITCHING   Items with * indicate a potential emergency and should be followed up as soon as possible or go to the Emergency Department if any problems should occur.  Please show the CHEMOTHERAPY ALERT  CARD or IMMUNOTHERAPY ALERT CARD at check-in to the Emergency Department and triage nurse.  Should you have questions after your visit or need to cancel or reschedule your appointment, please contact [facility] - A DEPT OF [facility]  [phone] and follow the prompts.  Office hours are 8:00 a.m. to 4:30 p.m. Monday - Friday. Please note that voicemails left after 4:00 p.m. may not be returned until the following business day.  We are closed weekends and major holidays. You have access to a nurse at all times for urgent questions. Please call the main number to the clinic [phone] and follow the prompts.  For any non-urgent questions, you may also contact your provider using MyChart. We now offer e-Visits for anyone 53 and older to request care online for non-urgent symptoms. For details visit [email or web address].   Also download the MyChart app! Go to the app store, search MyChart, open the app, select [facility], and log in with your MyChart username and password.  Oxaliplatin  Injection What is this medication? OXALIPLATIN  (ox AL i PLA tin) treats colorectal cancer. It works by slowing down the growth of cancer cells. This medicine may be used for other purposes; ask your health care provider or pharmacist if you have questions. COMMON BRAND NAME(S): Eloxatin  What should I tell my care team before I take this medication? They need to know if you have any of these conditions: Heart disease History of irregular heartbeat or rhythm Liver disease Low blood cell levels (white cells, red cells, and platelets) Lung or breathing  disease, such as asthma Take medications that treat or prevent blood clots Tingling of the fingers, toes, or other nerve disorder An unusual or allergic reaction to oxaliplatin , other medications, foods, dyes, or preservatives If you or your partner are pregnant or trying to get pregnant Breast-feeding How should I use this  medication? This medication is injected into a vein. It is given by your care team in a hospital or clinic setting. Talk to your care team about the use of this medication in children. Special care may be needed. Overdosage: If you think you have taken too much of this medicine contact a poison control center or emergency room at once. NOTE: This medicine is only for you. Do not share this medicine with others. What if I miss a dose? Keep appointments for follow-up doses. It is important not to miss a dose. Call your care team if you are unable to keep an appointment. What may interact with this medication? Do not take this medication with any of the following: Cisapride Dronedarone Pimozide Thioridazine This medication may also interact with the following: Aspirin and aspirin-like medications Certain medications that treat or prevent blood clots, such as warfarin, apixaban , dabigatran, and rivaroxaban Cisplatin Cyclosporine Diuretics Medications for infection, such as acyclovir, adefovir, amphotericin B, bacitracin, cidofovir, foscarnet, ganciclovir, gentamicin, pentamidine, vancomycin NSAIDs, medications for pain and inflammation, such as ibuprofen  or naproxen Other medications that cause heart rhythm changes Pamidronate Zoledronic acid This list may not describe all possible interactions. Give your health care provider a list of all the medicines, herbs, non-prescription drugs, or dietary supplements you use. Also tell them if you smoke, drink alcohol, or use illegal drugs. Some items may interact with your medicine. What should I watch for while using this medication? Your condition will be monitored carefully while you are receiving this medication. You may need blood work while taking this medication. This medication may make you feel generally unwell. This is not uncommon as chemotherapy can affect healthy cells as well as cancer cells. Report any side effects. Continue your course  of treatment even though you feel ill unless your care team tells you to stop. This medication may increase your risk of getting an infection. Call your care team for advice if you get a fever, chills, sore throat, or other symptoms of a cold or flu. Do not treat yourself. Try to avoid being around people who are sick. Avoid taking medications that contain aspirin, acetaminophen , ibuprofen , naproxen, or ketoprofen unless instructed by your care team. These medications may hide a fever. Be careful brushing or flossing your teeth or using a toothpick because you may get an infection or bleed more easily. If you have any dental work done, tell your dentist you are receiving this medication. This medication can make you more sensitive to cold. Do not drink cold drinks or use ice. Cover exposed skin before coming in contact with cold temperatures or cold objects. When out in cold weather wear warm clothing and cover your mouth and nose to warm the air that goes into your lungs. Tell your care team if you get sensitive to the cold. Talk to your care team if you or your partner are pregnant or think either of you might be pregnant. This medication can cause serious birth defects if taken during pregnancy and for 9 months after the last dose. A negative pregnancy test is required before starting this medication. A reliable form of contraception is recommended while taking this medication and for 9 months  after the last dose. Talk to your care team about effective forms of contraception. Do not father a child while taking this medication and for 6 months after the last dose. Use a condom while having sex during this time period. Do not breastfeed while taking this medication and for 3 months after the last dose. This medication may cause infertility. Talk to your care team if you are concerned about your fertility. What side effects may I notice from receiving this medication? Side effects that you should report to  your care team as soon as possible: Allergic reactions--skin rash, itching, hives, swelling of the face, lips, tongue, or throat Bleeding--bloody or black, tar-like stools, vomiting blood or brown material that looks like coffee grounds, red or dark brown urine, small red or purple spots on skin, unusual bruising or bleeding Dry cough, shortness of breath or trouble breathing Heart rhythm changes--fast or irregular heartbeat, dizziness, feeling faint or lightheaded, chest pain, trouble breathing Infection--fever, chills, cough, sore throat, wounds that don't heal, pain or trouble when passing urine, general feeling of discomfort or being unwell Liver injury--right upper belly pain, loss of appetite, nausea, light-colored stool, dark yellow or brown urine, yellowing skin or eyes, unusual weakness or fatigue Low red blood cell level--unusual weakness or fatigue, dizziness, headache, trouble breathing Muscle injury--unusual weakness or fatigue, muscle pain, dark yellow or brown urine, decrease in amount of urine Pain, tingling, or numbness in the hands or feet Sudden and severe headache, confusion, change in vision, seizures, which may be signs of posterior reversible encephalopathy syndrome (PRES) Unusual bruising or bleeding Side effects that usually do not require medical attention (report to your care team if they continue or are bothersome): Diarrhea Nausea Pain, redness, or swelling with sores inside the mouth or throat Unusual weakness or fatigue Vomiting This list may not describe all possible side effects. Call your doctor for medical advice about side effects. You may report side effects to FDA at 1-800-FDA-1088. Where should I keep my medication? This medication is given in a hospital or clinic. It will not be stored at home. NOTE: This sheet is a summary. It may not cover all possible information. If you have questions about this medicine, talk to your doctor, pharmacist, or health care  provider.  2024 Elsevier/Gold Standard (2023-06-12 00:00:00)  Leucovorin  Injection What is this medication? LEUCOVORIN  (loo koe VOR in) prevents side effects from certain medications, such as methotrexate. It works by increasing folate levels. This helps protect healthy cells in your body. It may also be used to treat anemia caused by low levels of folate. It can also be used with fluorouracil , a type of chemotherapy, to treat colorectal cancer. It works by increasing the effects of fluorouracil  in the body. This medicine may be used for other purposes; ask your health care provider or pharmacist if you have questions. What should I tell my care team before I take this medication? They need to know if you have any of these conditions: Anemia from low levels of vitamin B12 in the blood An unusual or allergic reaction to leucovorin , folic acid, other medications, foods, dyes, or preservatives Pregnant or trying to get pregnant Breastfeeding How should I use this medication? This medication is injected into a vein or a muscle. It is given by your care team in a hospital or clinic setting. Talk to your care team about the use of this medication in children. Special care may be needed. Overdosage: If you think you have taken too much  of this medicine contact a poison control center or emergency room at once. NOTE: This medicine is only for you. Do not share this medicine with others. What if I miss a dose? Keep appointments for follow-up doses. It is important not to miss your dose. Call your care team if you are unable to keep an appointment. What may interact with this medication? Capecitabine Fluorouracil  Phenobarbital Phenytoin Primidone Trimethoprim;sulfamethoxazole This list may not describe all possible interactions. Give your health care provider a list of all the medicines, herbs, non-prescription drugs, or dietary supplements you use. Also tell them if you smoke, drink alcohol, or  use illegal drugs. Some items may interact with your medicine. What should I watch for while using this medication? Your condition will be monitored carefully while you are receiving this medication. This medication may increase the side effects of 5-fluorouracil . Tell your care team if you have diarrhea or mouth sores that do not get better or that get worse. What side effects may I notice from receiving this medication? Side effects that you should report to your care team as soon as possible: Allergic reactions--skin rash, itching, hives, swelling of the face, lips, tongue, or throat This list may not describe all possible side effects. Call your doctor for medical advice about side effects. You may report side effects to FDA at 1-800-FDA-1088. Where should I keep my medication? This medication is given in a hospital or clinic. It will not be stored at home. NOTE: This sheet is a summary. It may not cover all possible information. If you have questions about this medicine, talk to your doctor, pharmacist, or health care provider.  2024 Elsevier/Gold Standard (2021-12-03 00:00:00)   Fluorouracil  Injection What is this medication? FLUOROURACIL  (flure oh YOOR a sil) treats some types of cancer. It works by slowing down the growth of cancer cells. This medicine may be used for other purposes; ask your health care provider or pharmacist if you have questions. COMMON BRAND NAME(S): Adrucil  What should I tell my care team before I take this medication? They need to know if you have any of these conditions: Blood disorders Dihydropyrimidine dehydrogenase (DPD) deficiency Infection, such as chickenpox, cold sores, herpes Kidney disease Liver disease Poor nutrition Recent or ongoing radiation therapy An unusual or allergic reaction to fluorouracil , other medications, foods, dyes, or preservatives If you or your partner are pregnant or trying to get pregnant Breast-feeding How should I use  this medication? This medication is injected into a vein. It is administered by your care team in a hospital or clinic setting. Talk to your care team about the use of this medication in children. Special care may be needed. Overdosage: If you think you have taken too much of this medicine contact a poison control center or emergency room at once. NOTE: This medicine is only for you. Do not share this medicine with others. What if I miss a dose? Keep appointments for follow-up doses. It is important not to miss your dose. Call your care team if you are unable to keep an appointment. What may interact with this medication? Do not take this medication with any of the following: Live virus vaccines This medication may also interact with the following: Medications that treat or prevent blood clots, such as warfarin, enoxaparin , dalteparin This list may not describe all possible interactions. Give your health care provider a list of all the medicines, herbs, non-prescription drugs, or dietary supplements you use. Also tell them if you smoke, drink alcohol, or  use illegal drugs. Some items may interact with your medicine. What should I watch for while using this medication? Your condition will be monitored carefully while you are receiving this medication. This medication may make you feel generally unwell. This is not uncommon as chemotherapy can affect healthy cells as well as cancer cells. Report any side effects. Continue your course of treatment even though you feel ill unless your care team tells you to stop. In some cases, you may be given additional medications to help with side effects. Follow all directions for their use. This medication may increase your risk of getting an infection. Call your care team for advice if you get a fever, chills, sore throat, or other symptoms of a cold or flu. Do not treat yourself. Try to avoid being around people who are sick. This medication may increase your  risk to bruise or bleed. Call your care team if you notice any unusual bleeding. Be careful brushing or flossing your teeth or using a toothpick because you may get an infection or bleed more easily. If you have any dental work done, tell your dentist you are receiving this medication. Avoid taking medications that contain aspirin, acetaminophen , ibuprofen , naproxen, or ketoprofen unless instructed by your care team. These medications may hide a fever. Do not treat diarrhea with over the counter products. Contact your care team if you have diarrhea that lasts more than 2 days or if it is severe and watery. This medication can make you more sensitive to the sun. Keep out of the sun. If you cannot avoid being in the sun, wear protective clothing and sunscreen. Do not use sun lamps, tanning beds, or tanning booths. Talk to your care team if you or your partner wish to become pregnant or think you might be pregnant. This medication can cause serious birth defects if taken during pregnancy and for 3 months after the last dose. A reliable form of contraception is recommended while taking this medication and for 3 months after the last dose. Talk to your care team about effective forms of contraception. Do not father a child while taking this medication and for 3 months after the last dose. Use a condom while having sex during this time period. Do not breastfeed while taking this medication. This medication may cause infertility. Talk to your care team if you are concerned about your fertility. What side effects may I notice from receiving this medication? Side effects that you should report to your care team as soon as possible: Allergic reactions--skin rash, itching, hives, swelling of the face, lips, tongue, or throat Heart attack--pain or tightness in the chest, shoulders, arms, or jaw, nausea, shortness of breath, cold or clammy skin, feeling faint or lightheaded Heart failure--shortness of breath,  swelling of the ankles, feet, or hands, sudden weight gain, unusual weakness or fatigue Heart rhythm changes--fast or irregular heartbeat, dizziness, feeling faint or lightheaded, chest pain, trouble breathing High ammonia  level--unusual weakness or fatigue, confusion, loss of appetite, nausea, vomiting, seizures Infection--fever, chills, cough, sore throat, wounds that don't heal, pain or trouble when passing urine, general feeling of discomfort or being unwell Low red blood cell level--unusual weakness or fatigue, dizziness, headache, trouble breathing Pain, tingling, or numbness in the hands or feet, muscle weakness, change in vision, confusion or trouble speaking, loss of balance or coordination, trouble walking, seizures Redness, swelling, and blistering of the skin over hands and feet Severe or prolonged diarrhea Unusual bruising or bleeding Side effects that usually do not  require medical attention (report to your care team if they continue or are bothersome): Dry skin Headache Increased tears Nausea Pain, redness, or swelling with sores inside the mouth or throat Sensitivity to light Vomiting This list may not describe all possible side effects. Call your doctor for medical advice about side effects. You may report side effects to FDA at 1-800-FDA-1088. Where should I keep my medication? This medication is given in a hospital or clinic. It will not be stored at home. NOTE: This sheet is a summary. It may not cover all possible information. If you have questions about this medicine, talk to your doctor, pharmacist, or health care provider.  2024 Elsevier/Gold Standard (2021-11-05 00:00:00)

## 2024-07-15 ENCOUNTER — Inpatient Hospital Stay

## 2024-07-15 ENCOUNTER — Ambulatory Visit: Payer: Self-pay | Admitting: Internal Medicine

## 2024-07-15 ENCOUNTER — Inpatient Hospital Stay: Attending: Oncology

## 2024-07-15 VITALS — BP 122/92 | HR 70 | Temp 97.0°F | Resp 18

## 2024-07-15 DIAGNOSIS — Z79630 Long term (current) use of alkylating agent: Secondary | ICD-10-CM | POA: Diagnosis not present

## 2024-07-15 DIAGNOSIS — Z5111 Encounter for antineoplastic chemotherapy: Secondary | ICD-10-CM | POA: Diagnosis present

## 2024-07-15 DIAGNOSIS — D701 Agranulocytosis secondary to cancer chemotherapy: Secondary | ICD-10-CM | POA: Diagnosis not present

## 2024-07-15 DIAGNOSIS — C787 Secondary malignant neoplasm of liver and intrahepatic bile duct: Secondary | ICD-10-CM | POA: Insufficient documentation

## 2024-07-15 DIAGNOSIS — E782 Mixed hyperlipidemia: Secondary | ICD-10-CM

## 2024-07-15 DIAGNOSIS — D509 Iron deficiency anemia, unspecified: Secondary | ICD-10-CM | POA: Insufficient documentation

## 2024-07-15 DIAGNOSIS — C180 Malignant neoplasm of cecum: Secondary | ICD-10-CM | POA: Diagnosis present

## 2024-07-15 DIAGNOSIS — Z79631 Long term (current) use of antimetabolite agent: Secondary | ICD-10-CM | POA: Diagnosis not present

## 2024-07-15 DIAGNOSIS — T451X5A Adverse effect of antineoplastic and immunosuppressive drugs, initial encounter: Secondary | ICD-10-CM | POA: Diagnosis not present

## 2024-07-15 MED ORDER — PEGFILGRASTIM-JMDB 6 MG/0.6ML ~~LOC~~ SOSY
6.0000 mg | PREFILLED_SYRINGE | Freq: Once | SUBCUTANEOUS | Status: AC
  Administered 2024-07-15: 6 mg via SUBCUTANEOUS

## 2024-07-15 NOTE — Patient Instructions (Signed)

## 2024-07-16 LAB — SPECIMEN STATUS REPORT

## 2024-07-16 LAB — LIPOPROTEIN A (LPA): Lipoprotein (a): 199.1 nmol/L — ABNORMAL HIGH

## 2024-07-18 ENCOUNTER — Ambulatory Visit: Admitting: Cardiovascular Disease

## 2024-07-18 ENCOUNTER — Encounter: Payer: Self-pay | Admitting: Cardiovascular Disease

## 2024-07-18 VITALS — BP 134/80 | HR 99 | Ht 69.0 in | Wt 211.8 lb

## 2024-07-18 DIAGNOSIS — R002 Palpitations: Secondary | ICD-10-CM

## 2024-07-18 DIAGNOSIS — I10 Essential (primary) hypertension: Secondary | ICD-10-CM | POA: Diagnosis not present

## 2024-07-18 DIAGNOSIS — R000 Tachycardia, unspecified: Secondary | ICD-10-CM | POA: Diagnosis not present

## 2024-07-18 DIAGNOSIS — C787 Secondary malignant neoplasm of liver and intrahepatic bile duct: Secondary | ICD-10-CM

## 2024-07-18 DIAGNOSIS — R9431 Abnormal electrocardiogram [ECG] [EKG]: Secondary | ICD-10-CM | POA: Diagnosis not present

## 2024-07-18 DIAGNOSIS — E669 Obesity, unspecified: Secondary | ICD-10-CM

## 2024-07-18 DIAGNOSIS — R0789 Other chest pain: Secondary | ICD-10-CM | POA: Diagnosis not present

## 2024-07-18 DIAGNOSIS — I2699 Other pulmonary embolism without acute cor pulmonale: Secondary | ICD-10-CM | POA: Diagnosis not present

## 2024-07-18 DIAGNOSIS — E782 Mixed hyperlipidemia: Secondary | ICD-10-CM

## 2024-07-18 NOTE — Progress Notes (Signed)
 "     Cardiology Office Note   Date:  07/18/2024   ID:  Amber Price, DOB 15-Jan-1981, MRN [id number]  PCP:  Fernand Fredy RAMAN, MD  Cardiologist:  Denyse Fernand, MD      History of Present Illness: Amber Price is a 44 y.o. female who presents for  Chief Complaint  Patient presents with   Consult    Abnormal EKG    43 YOAF present with palpitation associated with tachycardia, and was admitted in 9/25 with HR 130.  Patient has a history of having metastatic colon cancer and was admitted in September 2025 with deep vein thrombosis leading to pulmonary embolism and has been on Eliquis .  She also had E. coli sepsis which could all explain history of tachycardia.  Patient was seen by Dr. Fredy Fernand on December 12 and had a EKG which shows sinus tachycardia with APCs but 135 bpm.  She was placed on metoprolol  succinate 25 mg once a day.  She has a heart rate of 74 today.      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 RECK 2018 ANNUAL   Sickle cell trait    Vitamin D  deficiency 01/2016   ADD VIT D3 5000 IU DAILY     Past Surgical History:  Procedure Laterality Date   COLONOSCOPY N/A 04/08/2024   Procedure: COLONOSCOPY;  Surgeon: Jinny Carmine, MD;  Location: [facility] ENDOSCOPY;  Service: Endoscopy;  Laterality: N/A;   IR IMAGING GUIDED PORT INSERTION  05/26/2024   NO PAST SURGERIES       Current Outpatient Medications  Medication Sig Dispense Refill   amLODipine  (NORVASC ) 5 MG tablet Take 1 tablet (5 mg total) by mouth daily. 30 tablet 11   [START ON 07/31/2024] apixaban  (ELIQUIS ) 5 MG TABS tablet Take 1 tablet (5 mg total) by mouth 2 (two) times daily. 60 tablet 4   Cholecalciferol (VITAMIN D3) 1.25 MG (50000 UT) CAPS  TAKE 1 CAPSULE BY MOUTH ONE TIME PER WEEK 12 capsule 3   dexamethasone  (DECADRON ) 4 MG tablet Take 2 tablets (8 mg total) by mouth daily. Start the day after chemotherapy for 2 days. Take with food. 30 tablet 1   ferrous sulfate  220 (44 Fe) MG/5ML solution Take 220 mg by mouth daily.     lidocaine -prilocaine  (EMLA ) cream Apply to affected area once 30 g 3   LORazepam  (ATIVAN ) 0.5 MG tablet Take 1 tablet (0.5 mg total) by mouth every 12 (twelve) hours as needed for anxiety or sleep (nausea). 60 tablet 0   metoprolol  succinate (TOPROL  XL) 25 MG 24 hr tablet Take 1 tablet (25 mg total) by mouth daily. 30 tablet 11   ondansetron  (ZOFRAN ) 8 MG tablet Take 1 tablet (8 mg total) by mouth every 8 (eight) hours as needed for nausea or vomiting. Start on the third day after chemotherapy. 30 tablet 1   prochlorperazine  (COMPAZINE ) 10 MG tablet Take 1 tablet (10 mg total) by mouth every 6 (six) hours as needed for nausea or vomiting. 30 tablet 1   rosuvastatin  (CRESTOR ) 20 MG tablet TAKE 1 TABLET BY MOUTH EVERY DAY 90 tablet 3  valACYclovir  (VALTREX ) 500 MG tablet Take 1 tablet (500 mg total) by mouth daily. 90 tablet 3   No current facility-administered medications for this visit.    Allergies:   Tape    Social History:   reports that she has never smoked. She has never used smokeless tobacco. She reports that she does not currently use alcohol. She reports that she does not use drugs.   Family History:  family history includes Cancer (age of onset: 46) in her paternal grandmother; Diabetes Mellitus II in her mother; Hypercholesterolemia in her father; Hypertension in her mother; Sickle cell trait in her son.    ROS:     Review of Systems  Constitutional: Negative.   HENT: Negative.    Eyes: Negative.   Respiratory: Negative.    Gastrointestinal: Negative.   Genitourinary: Negative.   Musculoskeletal: Negative.   Skin: Negative.   Neurological: Negative.   Endo/Heme/Allergies: Negative.    Psychiatric/Behavioral: Negative.    All other systems reviewed and are negative.     All other systems are reviewed and negative.    PHYSICAL EXAM: VS:  BP 134/80   Pulse 99   Ht 5' 9 (1.753 m)   Wt 211 lb 12.8 oz (96.1 kg)   SpO2 98%   BMI 31.28 kg/m  , BMI Body mass index is 31.28 kg/m. Last weight:  Wt Readings from Last 3 Encounters:  07/18/24 211 lb 12.8 oz (96.1 kg)  07/12/24 215 lb 3.2 oz (97.6 kg)  06/30/24 215 lb 13.3 oz (97.9 kg)     Physical Exam Constitutional:      Appearance: Normal appearance.  Cardiovascular:     Rate and Rhythm: Normal rate and regular rhythm.     Heart sounds: Normal heart sounds.  Pulmonary:     Effort: Pulmonary effort is normal.     Breath sounds: Normal breath sounds.  Musculoskeletal:     Right lower leg: No edema.     Left lower leg: No edema.  Neurological:     Mental Status: She is alert.       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  Recent Labs: 04/06/2024: TSH 0.560 06/30/2024: Pro Brain Natriuretic Peptide 87.6 07/12/2024: Magnesium 1.9 07/13/2024: ALT 30; BUN 10; Creatinine 0.88; Hemoglobin 9.7; Platelet Count 258; Potassium 4.1; Sodium 138    Lipid Panel    Component Value Date/Time   CHOL 252 (H) 07/12/2024 1137   TRIG 168 (H) 07/12/2024 1137   HDL 64 07/12/2024 1137   LDLCALC 158 (H) 07/12/2024 1137      Other studies Reviewed: Additional studies/ records that were reviewed today include:  Review of the above records demonstrates:       No data to display            ASSESSMENT AND PLAN:    ICD-10-CM   1. Mixed hyperlipidemia  E78.2 PCV ECHOCARDIOGRAM COMPLETE    MYOCARDIAL PERFUSION IMAGING    2. Essential hypertension, benign  I10 PCV ECHOCARDIOGRAM COMPLETE    MYOCARDIAL PERFUSION IMAGING    3. Sinus tachycardia  R00.0 PCV ECHOCARDIOGRAM COMPLETE    MYOCARDIAL PERFUSION IMAGING   Advised taking metoprolol  succinate 25 mg daily and then intermittently  take as needed extra dose if there is a heart rate over 130.    4. 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  Will  evaluate for coronary artery disease by doing nuclear stress test.    5. Obesity (BMI 30-39.9)  E66.9 PCV ECHOCARDIOGRAM COMPLETE    MYOCARDIAL PERFUSION IMAGING    6. Other acute pulmonary embolism without acute cor pulmonale (HCC)  I26.99 PCV ECHOCARDIOGRAM COMPLETE    MYOCARDIAL PERFUSION IMAGING    7. Other chest pain  R07.89 PCV ECHOCARDIOGRAM COMPLETE    MYOCARDIAL PERFUSION IMAGING   Patient has intermittent chest pain associated with palpitation.  Will set up nuclear stress test.    8. Palpitation  R00.2 PCV ECHOCARDIOGRAM COMPLETE    MYOCARDIAL PERFUSION IMAGING   Palpitation due to sinus tachycardia and APCs and was placed on metoprolol  25 mg once a day which has improved the symptoms.  Will get a echocardiogram.    9. 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)   Relevant Orders   PCV ECHOCARDIOGRAM COMPLETE   MYOCARDIAL PERFUSION IMAGING   Essential hypertension, benign   Relevant Orders   PCV ECHOCARDIOGRAM COMPLETE   MYOCARDIAL PERFUSION IMAGING     Digestive   Metastasis to liver (HCC) (Chronic)     Other   Obesity (BMI 30-39.9)   Relevant Orders   PCV ECHOCARDIOGRAM COMPLETE   MYOCARDIAL PERFUSION IMAGING   Mixed hyperlipidemia - Primary   Relevant Orders   PCV ECHOCARDIOGRAM COMPLETE   MYOCARDIAL PERFUSION IMAGING   Other Visit Diagnoses       Sinus tachycardia       Advised taking metoprolol  succinate 25 mg daily and then intermittently take as needed extra dose if there is a heart rate over 130.   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  Will evaluate for coronary artery disease by doing nuclear stress test.   Relevant Orders   PCV ECHOCARDIOGRAM COMPLETE   MYOCARDIAL PERFUSION IMAGING     Other chest pain       Patient has intermittent chest pain associated with palpitation.  Will set up nuclear stress test.   Relevant Orders   PCV ECHOCARDIOGRAM COMPLETE   MYOCARDIAL PERFUSION IMAGING     Palpitation       Palpitation due to sinus tachycardia and APCs and was placed on metoprolol  25 mg once a day which has improved the symptoms.  Will get a echocardiogram.   Relevant Orders   PCV ECHOCARDIOGRAM COMPLETE   MYOCARDIAL PERFUSION IMAGING          Disposition:   Return in about 6 weeks (around 08/29/2024) for echo and f/u.    Total time spent: 50 minutes  Signed,  Denyse Bathe, MD  07/18/2024 10:35 AM    [facility] "

## 2024-07-18 NOTE — Progress Notes (Signed)
"  Patient notified   "

## 2024-07-27 ENCOUNTER — Inpatient Hospital Stay: Admitting: Oncology

## 2024-07-27 ENCOUNTER — Inpatient Hospital Stay

## 2024-07-27 ENCOUNTER — Encounter: Payer: Self-pay | Admitting: Oncology

## 2024-07-27 VITALS — BP 130/76 | HR 88 | Temp 97.1°F | Resp 18 | Wt 216.1 lb

## 2024-07-27 DIAGNOSIS — I2699 Other pulmonary embolism without acute cor pulmonale: Secondary | ICD-10-CM | POA: Diagnosis not present

## 2024-07-27 DIAGNOSIS — C787 Secondary malignant neoplasm of liver and intrahepatic bile duct: Secondary | ICD-10-CM

## 2024-07-27 DIAGNOSIS — Z5111 Encounter for antineoplastic chemotherapy: Secondary | ICD-10-CM | POA: Diagnosis not present

## 2024-07-27 DIAGNOSIS — C180 Malignant neoplasm of cecum: Secondary | ICD-10-CM

## 2024-07-27 DIAGNOSIS — T451X5A Adverse effect of antineoplastic and immunosuppressive drugs, initial encounter: Secondary | ICD-10-CM

## 2024-07-27 DIAGNOSIS — D701 Agranulocytosis secondary to cancer chemotherapy: Secondary | ICD-10-CM | POA: Diagnosis not present

## 2024-07-27 DIAGNOSIS — D509 Iron deficiency anemia, unspecified: Secondary | ICD-10-CM

## 2024-07-27 DIAGNOSIS — R000 Tachycardia, unspecified: Secondary | ICD-10-CM | POA: Diagnosis not present

## 2024-07-27 LAB — CMP (CANCER CENTER ONLY)
ALT: 15 U/L (ref 0–44)
AST: 21 U/L (ref 15–41)
Albumin: 3.9 g/dL (ref 3.5–5.0)
Alkaline Phosphatase: 121 U/L (ref 38–126)
Anion gap: 10 (ref 5–15)
BUN: 5 mg/dL — ABNORMAL LOW (ref 6–20)
CO2: 23 mmol/L (ref 22–32)
Calcium: 8.9 mg/dL (ref 8.9–10.3)
Chloride: 106 mmol/L (ref 98–111)
Creatinine: 0.85 mg/dL (ref 0.44–1.00)
GFR, Estimated: >60 mL/min
Glucose, Bld: 96 mg/dL (ref 70–99)
Potassium: 4 mmol/L (ref 3.5–5.1)
Sodium: 139 mmol/L (ref 135–145)
Total Bilirubin: 0.3 mg/dL (ref 0.0–1.2)
Total Protein: 6.9 g/dL (ref 6.5–8.1)

## 2024-07-27 LAB — PREGNANCY, URINE: Preg Test, Ur: NEGATIVE

## 2024-07-27 LAB — CBC WITH DIFFERENTIAL (CANCER CENTER ONLY)
Abs Immature Granulocytes: 0.16 K/uL — ABNORMAL HIGH (ref 0.00–0.07)
Basophils Absolute: 0 K/uL (ref 0.0–0.1)
Basophils Relative: 0 %
Eosinophils Absolute: 0 K/uL (ref 0.0–0.5)
Eosinophils Relative: 0 %
HCT: 29.4 % — ABNORMAL LOW (ref 36.0–46.0)
Hemoglobin: 9.6 g/dL — ABNORMAL LOW (ref 12.0–15.0)
Immature Granulocytes: 2 %
Lymphocytes Relative: 20 %
Lymphs Abs: 2.1 K/uL (ref 0.7–4.0)
MCH: 24.8 pg — ABNORMAL LOW (ref 26.0–34.0)
MCHC: 32.7 g/dL (ref 30.0–36.0)
MCV: 76 fL — ABNORMAL LOW (ref 80.0–100.0)
Monocytes Absolute: 0.7 K/uL (ref 0.1–1.0)
Monocytes Relative: 7 %
Neutro Abs: 7.2 K/uL (ref 1.7–7.7)
Neutrophils Relative %: 71 %
Platelet Count: 157 K/uL (ref 150–400)
RBC: 3.87 MIL/uL (ref 3.87–5.11)
RDW: 20 % — ABNORMAL HIGH (ref 11.5–15.5)
WBC Count: 10.2 K/uL (ref 4.0–10.5)
nRBC: 0 % (ref 0.0–0.2)

## 2024-07-27 MED ORDER — OXALIPLATIN CHEMO INJECTION 100 MG/20ML
85.0000 mg/m2 | Freq: Once | INTRAVENOUS | Status: AC
  Administered 2024-07-27: 200 mg via INTRAVENOUS
  Filled 2024-07-27: qty 40

## 2024-07-27 MED ORDER — DEXAMETHASONE SOD PHOSPHATE PF 10 MG/ML IJ SOLN
10.0000 mg | Freq: Once | INTRAMUSCULAR | Status: AC
  Administered 2024-07-27: 10 mg via INTRAVENOUS
  Filled 2024-07-27: qty 1

## 2024-07-27 MED ORDER — PALONOSETRON HCL INJECTION 0.25 MG/5ML
0.2500 mg | Freq: Once | INTRAVENOUS | Status: AC
  Administered 2024-07-27: 0.25 mg via INTRAVENOUS
  Filled 2024-07-27: qty 5

## 2024-07-27 MED ORDER — SODIUM CHLORIDE 0.9 % IV SOLN
2400.0000 mg/m2 | INTRAVENOUS | Status: DC
  Administered 2024-07-27: 5000 mg via INTRAVENOUS
  Filled 2024-07-27: qty 100

## 2024-07-27 MED ORDER — DEXTROSE 5 % IV SOLN
INTRAVENOUS | Status: DC
  Filled 2024-07-27: qty 250

## 2024-07-27 MED ORDER — FLUOROURACIL CHEMO INJECTION 2.5 GM/50ML
400.0000 mg/m2 | Freq: Once | INTRAVENOUS | Status: AC
  Administered 2024-07-27: 900 mg via INTRAVENOUS
  Filled 2024-07-27: qty 18

## 2024-07-27 MED ORDER — LEUCOVORIN CALCIUM INJECTION 350 MG
400.0000 mg/m2 | Freq: Once | INTRAVENOUS | Status: AC
  Administered 2024-07-27: 896 mg via INTRAVENOUS
  Filled 2024-07-27: qty 44.8

## 2024-07-27 NOTE — Assessment & Plan Note (Signed)
 Resolved, on beta blocker

## 2024-07-27 NOTE — Patient Instructions (Signed)
 [facility] - A DEPT OF [facility]  Discharge Instructions: Thank you for choosing [facility] to provide your oncology and hematology care.  If you have a lab appointment with the Cancer Center, please go directly to the Cancer Center and check in at the registration area.  Wear comfortable clothing and clothing appropriate for easy access to any Portacath or PICC line.   We strive to give you quality time with your provider. You may need to reschedule your appointment if you arrive late (15 or more minutes).  Arriving late affects you and other patients whose appointments are after yours.  Also, if you miss three or more appointments without notifying the office, you may be dismissed from the clinic at the providers discretion.      For prescription refill requests, have your pharmacy contact our office and allow 72 hours for refills to be completed.    Today you received the following chemotherapy and/or immunotherapy agents Oxaliplatin , 5 FU      To help prevent nausea and vomiting after your treatment, we encourage you to take your nausea medication as directed.  BELOW ARE SYMPTOMS THAT SHOULD BE REPORTED IMMEDIATELY: *FEVER GREATER THAN 100.4 F (38 C) OR HIGHER *CHILLS OR SWEATING *NAUSEA AND VOMITING THAT IS NOT CONTROLLED WITH YOUR NAUSEA MEDICATION *UNUSUAL SHORTNESS OF BREATH *UNUSUAL BRUISING OR BLEEDING *URINARY PROBLEMS (pain or burning when urinating, or frequent urination) *BOWEL PROBLEMS (unusual diarrhea, constipation, pain near the anus) TENDERNESS IN MOUTH AND THROAT WITH OR WITHOUT PRESENCE OF ULCERS (sore throat, sores in mouth, or a toothache) UNUSUAL RASH, SWELLING OR PAIN  UNUSUAL VAGINAL DISCHARGE OR ITCHING   Items with * indicate a potential emergency and should be followed up as soon as possible or go to the Emergency Department if any problems should occur.  Please show the CHEMOTHERAPY ALERT CARD or  IMMUNOTHERAPY ALERT CARD at check-in to the Emergency Department and triage nurse.  Should you have questions after your visit or need to cancel or reschedule your appointment, please contact [facility] - A DEPT OF [facility]  [phone] and follow the prompts.  Office hours are 8:00 a.m. to 4:30 p.m. Monday - Friday. Please note that voicemails left after 4:00 p.m. may not be returned until the following business day.  We are closed weekends and major holidays. You have access to a nurse at all times for urgent questions. Please call the main number to the clinic [phone] and follow the prompts.  For any non-urgent questions, you may also contact your provider using MyChart. We now offer e-Visits for anyone 90 and older to request care online for non-urgent symptoms. For details visit [email or web address].   Also download the MyChart app! Go to the app store, search MyChart, open the app, select [place], and log in with your MyChart username and password.  Oxaliplatin  Injection What is this medication? OXALIPLATIN  (ox AL i PLA tin) treats colorectal cancer. It works by slowing down the growth of cancer cells. This medicine may be used for other purposes; ask your health care provider or pharmacist if you have questions. COMMON BRAND NAME(S): Eloxatin  What should I tell my care team before I take this medication? They need to know if you have any of these conditions: Heart disease History of irregular heartbeat or rhythm Liver disease Low blood cell levels (white cells, red cells, and platelets) Lung or breathing disease, such  as asthma Take medications that treat or prevent blood clots Tingling of the fingers, toes, or other nerve disorder An unusual or allergic reaction to oxaliplatin , other medications, foods, dyes, or preservatives If you or your partner are pregnant or trying to get pregnant Breast-feeding How should I use this  medication? This medication is injected into a vein. It is given by your care team in a hospital or clinic setting. Talk to your care team about the use of this medication in children. Special care may be needed. Overdosage: If you think you have taken too much of this medicine contact a poison control center or emergency room at once. NOTE: This medicine is only for you. Do not share this medicine with others. What if I miss a dose? Keep appointments for follow-up doses. It is important not to miss a dose. Call your care team if you are unable to keep an appointment. What may interact with this medication? Do not take this medication with any of the following: Cisapride Dronedarone Pimozide Thioridazine This medication may also interact with the following: Aspirin and aspirin-like medications Certain medications that treat or prevent blood clots, such as warfarin, apixaban, dabigatran, and rivaroxaban Cisplatin Cyclosporine Diuretics Medications for infection, such as acyclovir, adefovir, amphotericin B, bacitracin, cidofovir, foscarnet, ganciclovir, gentamicin , pentamidine, vancomycin NSAIDs, medications for pain and inflammation, such as ibuprofen or naproxen Other medications that cause heart rhythm changes Pamidronate Zoledronic acid This list may not describe all possible interactions. Give your health care provider a list of all the medicines, herbs, non-prescription drugs, or dietary supplements you use. Also tell them if you smoke, drink alcohol, or use illegal drugs. Some items may interact with your medicine. What should I watch for while using this medication? Your condition will be monitored carefully while you are receiving this medication. You may need blood work while taking this medication. This medication may make you feel generally unwell. This is not uncommon as chemotherapy can affect healthy cells as well as cancer cells. Report any side effects. Continue your course  of treatment even though you feel ill unless your care team tells you to stop. This medication may increase your risk of getting an infection. Call your care team for advice if you get a fever, chills, sore throat, or other symptoms of a cold or flu. Do not treat yourself. Try to avoid being around people who are sick. Avoid taking medications that contain aspirin, acetaminophen, ibuprofen, naproxen, or ketoprofen unless instructed by your care team. These medications may hide a fever. Be careful brushing or flossing your teeth or using a toothpick because you may get an infection or bleed more easily. If you have any dental work done, tell your dentist you are receiving this medication. This medication can make you more sensitive to cold. Do not drink cold drinks or use ice. Cover exposed skin before coming in contact with cold temperatures or cold objects. When out in cold weather wear warm clothing and cover your mouth and nose to warm the air that goes into your lungs. Tell your care team if you get sensitive to the cold. Talk to your care team if you or your partner are pregnant or think either of you might be pregnant. This medication can cause serious birth defects if taken during pregnancy and for 9 months after the last dose. A negative pregnancy test is required before starting this medication. A reliable form of contraception is recommended while taking this medication and for 9 months after the  last dose. Talk to your care team about effective forms of contraception. Do not father a child while taking this medication and for 6 months after the last dose. Use a condom while having sex during this time period. Do not breastfeed while taking this medication and for 3 months after the last dose. This medication may cause infertility. Talk to your care team if you are concerned about your fertility. What side effects may I notice from receiving this medication? Side effects that you should report to  your care team as soon as possible: Allergic reactions--skin rash, itching, hives, swelling of the face, lips, tongue, or throat Bleeding--bloody or black, tar-like stools, vomiting blood or brown material that looks like coffee grounds, red or dark brown urine, small red or purple spots on skin, unusual bruising or bleeding Dry cough, shortness of breath or trouble breathing Heart rhythm changes--fast or irregular heartbeat, dizziness, feeling faint or lightheaded, chest pain, trouble breathing Infection--fever, chills, cough, sore throat, wounds that don't heal, pain or trouble when passing urine, general feeling of discomfort or being unwell Liver injury--right upper belly pain, loss of appetite, nausea, light-colored stool, dark yellow or brown urine, yellowing skin or eyes, unusual weakness or fatigue Low red blood cell level--unusual weakness or fatigue, dizziness, headache, trouble breathing Muscle injury--unusual weakness or fatigue, muscle pain, dark yellow or brown urine, decrease in amount of urine Pain, tingling, or numbness in the hands or feet Sudden and severe headache, confusion, change in vision, seizures, which may be signs of posterior reversible encephalopathy syndrome (PRES) Unusual bruising or bleeding Side effects that usually do not require medical attention (report to your care team if they continue or are bothersome): Diarrhea Nausea Pain, redness, or swelling with sores inside the mouth or throat Unusual weakness or fatigue Vomiting This list may not describe all possible side effects. Call your doctor for medical advice about side effects. You may report side effects to FDA at 1-800-FDA-1088. Where should I keep my medication? This medication is given in a hospital or clinic. It will not be stored at home. NOTE: This sheet is a summary. It may not cover all possible information. If you have questions about this medicine, talk to your doctor, pharmacist, or health care  provider.  2024 Elsevier/Gold Standard (2023-06-12 00:00:00)  Fluorouracil  Injection What is this medication? FLUOROURACIL  (flure oh YOOR a sil) treats some types of cancer. It works by slowing down the growth of cancer cells. This medicine may be used for other purposes; ask your health care provider or pharmacist if you have questions. COMMON BRAND NAME(S): Adrucil  What should I tell my care team before I take this medication? They need to know if you have any of these conditions: Blood disorders Dihydropyrimidine dehydrogenase (DPD) deficiency Infection, such as chickenpox, cold sores, herpes Kidney disease Liver disease Poor nutrition Recent or ongoing radiation therapy An unusual or allergic reaction to fluorouracil , other medications, foods, dyes, or preservatives If you or your partner are pregnant or trying to get pregnant Breast-feeding How should I use this medication? This medication is injected into a vein. It is administered by your care team in a hospital or clinic setting. Talk to your care team about the use of this medication in children. Special care may be needed. Overdosage: If you think you have taken too much of this medicine contact a poison control center or emergency room at once. NOTE: This medicine is only for you. Do not share this medicine with others. What if I  miss a dose? Keep appointments for follow-up doses. It is important not to miss your dose. Call your care team if you are unable to keep an appointment. What may interact with this medication? Do not take this medication with any of the following: Live virus vaccines This medication may also interact with the following: Medications that treat or prevent blood clots, such as warfarin, enoxaparin, dalteparin This list may not describe all possible interactions. Give your health care provider a list of all the medicines, herbs, non-prescription drugs, or dietary supplements you use. Also tell them if  you smoke, drink alcohol, or use illegal drugs. Some items may interact with your medicine. What should I watch for while using this medication? Your condition will be monitored carefully while you are receiving this medication. This medication may make you feel generally unwell. This is not uncommon as chemotherapy can affect healthy cells as well as cancer cells. Report any side effects. Continue your course of treatment even though you feel ill unless your care team tells you to stop. In some cases, you may be given additional medications to help with side effects. Follow all directions for their use. This medication may increase your risk of getting an infection. Call your care team for advice if you get a fever, chills, sore throat, or other symptoms of a cold or flu. Do not treat yourself. Try to avoid being around people who are sick. This medication may increase your risk to bruise or bleed. Call your care team if you notice any unusual bleeding. Be careful brushing or flossing your teeth or using a toothpick because you may get an infection or bleed more easily. If you have any dental work done, tell your dentist you are receiving this medication. Avoid taking medications that contain aspirin, acetaminophen, ibuprofen, naproxen, or ketoprofen unless instructed by your care team. These medications may hide a fever. Do not treat diarrhea with over the counter products. Contact your care team if you have diarrhea that lasts more than 2 days or if it is severe and watery. This medication can make you more sensitive to the sun. Keep out of the sun. If you cannot avoid being in the sun, wear protective clothing and sunscreen. Do not use sun lamps, tanning beds, or tanning booths. Talk to your care team if you or your partner wish to become pregnant or think you might be pregnant. This medication can cause serious birth defects if taken during pregnancy and for 3 months after the last dose. A reliable  form of contraception is recommended while taking this medication and for 3 months after the last dose. Talk to your care team about effective forms of contraception. Do not father a child while taking this medication and for 3 months after the last dose. Use a condom while having sex during this time period. Do not breastfeed while taking this medication. This medication may cause infertility. Talk to your care team if you are concerned about your fertility. What side effects may I notice from receiving this medication? Side effects that you should report to your care team as soon as possible: Allergic reactions--skin rash, itching, hives, swelling of the face, lips, tongue, or throat Heart attack--pain or tightness in the chest, shoulders, arms, or jaw, nausea, shortness of breath, cold or clammy skin, feeling faint or lightheaded Heart failure--shortness of breath, swelling of the ankles, feet, or hands, sudden weight gain, unusual weakness or fatigue Heart rhythm changes--fast or irregular heartbeat, dizziness, feeling faint or  lightheaded, chest pain, trouble breathing High ammonia level--unusual weakness or fatigue, confusion, loss of appetite, nausea, vomiting, seizures Infection--fever, chills, cough, sore throat, wounds that don't heal, pain or trouble when passing urine, general feeling of discomfort or being unwell Low red blood cell level--unusual weakness or fatigue, dizziness, headache, trouble breathing Pain, tingling, or numbness in the hands or feet, muscle weakness, change in vision, confusion or trouble speaking, loss of balance or coordination, trouble walking, seizures Redness, swelling, and blistering of the skin over hands and feet Severe or prolonged diarrhea Unusual bruising or bleeding Side effects that usually do not require medical attention (report to your care team if they continue or are bothersome): Dry skin Headache Increased tears Nausea Pain, redness, or  swelling with sores inside the mouth or throat Sensitivity to light Vomiting This list may not describe all possible side effects. Call your doctor for medical advice about side effects. You may report side effects to FDA at 1-800-FDA-1088. Where should I keep my medication? This medication is given in a hospital or clinic. It will not be stored at home. NOTE: This sheet is a summary. It may not cover all possible information. If you have questions about this medicine, talk to your doctor, pharmacist, or health care provider.  2024 Elsevier/Gold Standard (2021-11-05 00:00:00)

## 2024-07-27 NOTE — Assessment & Plan Note (Signed)
 Primary cecal adenocarcinoma, ileocolic mesenteric adenopathy and liver lesions. Baseline CEA was elevated at 32.8. pMMR  2nd opinion recommendation at [facility] was reviewed.  Recommend liver lesion biopsy patient declined.  Recommend molecular profiling- NGS testing. She has no desire of fertility preservation.  Recommend genetic testing-patient reports that she has a genetic appointment at [facility] in Feb 2026  Labs are reviewed and discussed with patient. Proceed with cycle 4 FOLFOX with day 3 pump DC

## 2024-07-27 NOTE — Assessment & Plan Note (Signed)
 Previously she declined liver lesion biopsy. She will repeat imaging at [facility] in the future for evaluation of treatment response.

## 2024-07-27 NOTE — Assessment & Plan Note (Signed)
 She will get long acting G-CSF on D3. Rationale and side effects were reviewed with patient.  She will take Claritin 10 mg daily for 4 days.

## 2024-07-27 NOTE — Assessment & Plan Note (Signed)
 Lab Results  Component Value Date   HGB 9.6 (L) 07/27/2024   TIBC 377 04/08/2024   IRONPCTSAT 5 (L) 04/08/2024   FERRITIN 63 04/08/2024    She takes oral iron supplementation. IDA is due to chronic blood loss, GI and heavy menstrual period.  Option of proceed with IV Venofer was discussed. . Patient prefers to continue iron supplementation for now.

## 2024-07-27 NOTE — Assessment & Plan Note (Signed)
Chemotherapy treatment as listed above. 

## 2024-07-27 NOTE — Progress Notes (Signed)
 " Hematology/Oncology Progress note [phone] [phone]     CHIEF COMPLAINTS/PURPOSE OF CONSULTATION:  Cecal adenocarcinoma liver lesions   ASSESSMENT & PLAN:   Cancer Staging  Primary cancer of cecum [facility]) Staging form: Colon and Rectum, AJCC 8th Edition - Clinical stage from 04/15/2024: cT2, cN1, cM1 - Signed by Babara Call, MD on 05/31/2024   Primary cancer of cecum [facility]) Primary cecal adenocarcinoma, ileocolic mesenteric adenopathy and liver lesions. Baseline CEA was elevated at 32.8. pMMR  2nd opinion recommendation at [facility] was reviewed.  Recommend liver lesion biopsy patient declined.  Recommend molecular profiling- NGS testing. She has no desire of fertility preservation.  Recommend genetic testing-patient reports that she has a genetic appointment at [facility] in Feb 2026  Labs are reviewed and discussed with patient. 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   Encounter for antineoplastic chemotherapy Chemotherapy treatment as listed above  Chemotherapy induced neutropenia She will get long acting G-CSF on D3. Rationale and side effects were reviewed with patient.  She will take Claritin 10 mg daily for 4 days.   Iron deficiency anemia Lab Results  Component Value Date   HGB 9.6 (L) 07/27/2024   TIBC 377 04/08/2024   IRONPCTSAT 5 (L) 04/08/2024   FERRITIN 63 04/08/2024    She takes oral iron supplementation. IDA is due to chronic blood loss, GI and heavy menstrual period.  Option of proceed with IV Venofer was discussed. . Patient prefers to continue iron supplementation for now.    Metastasis to liver [facility]) Previously she declined liver lesion biopsy. She will repeat imaging at [facility] in the future for evaluation of treatment response.   Tachycardia Resolved, on beta blocker   Orders Placed This Encounter  Procedures   CBC  with Differential (Cancer Center Only)    Standing Status:   Future    Expected Date:   08/24/2024    Expiration Date:   08/24/2025   CMP (Cancer Center only)    Standing Status:   Future    Expected Date:   08/24/2024    Expiration Date:   08/24/2025   Follow-up 2 weeks  All questions were answered. The patient knows to call the clinic with any problems, questions or concerns.  Call Babara, MD, PhD [facility] Hematology Oncology 07/27/2024    HISTORY OF PRESENTING ILLNESS:  Amber Price 44 y.o. female presents to establish care for stage IV cecal adenocarcinoma I have reviewed her chart and materials related to her cancer extensively and collaborated history with the patient. Summary of oncologic history is as follows: Oncology History  Primary cancer of cecum (HCC)  04/06/2024 Imaging   CT abdomen pelvis with contrast showed 1. Asymmetric wall thickening and enhancement in the cecum, measuring 4.3 cm in craniocaudal dimension, and involving the appendiceal orifice (axial 50, sagittal 61), worrisome for underlying colonic neoplasm. Likely causing obstruction of the appendix, which is fluid-filled and dilated appendix, suggesting acute appendicitis. Surgical consultation recommended. 2. A couple of mildly prominent right ileocolic chain lymph nodes are present measuring up to 7 mm (axial 50-52). These could be reactive or metastatic. 3. There are 3 hypodense lesions in both lobes of the liver, measuring up to 1.8 cm, likely metastatic disease.     04/06/2024 Imaging   CT chest angiogram No evidence of pulm embolism or other acute intrathoracic process.   04/07/2024 Imaging  MRI of liver with and without contrast showed 1. Multiple bilateral liver lesions. 2 left hepatic lobe lesions are highly suspicious for metastasis. 1 right liver lobe lesion is indeterminate but favored to represent a hemangioma. 2 smaller right liver lobe lesions are favored to be benign, likely  hemangiomas. Consider sampling of the segment 2 lesion versus further evaluation with PET. 2.  Cecal mass with adjacent adenopathy, suspicious for nodal metastasis.  04/08/2024, colonoscopy showed fungating partially obstructing large mass in the cecum. Biopsy pathology showed invasive moderately differentiated adenocarcinoma. pMMR    04/15/2024 Initial Diagnosis   Primary cancer of cecum   Patient presented to emergency room for evaluation of UTI symptoms.  She was found to have sepsis secondary to E. coli bacteremia, acute UTI.  Urine culture grew pansensitive E. coli.  Patient was treated with IV antibiotics and discharged on oral Levaquin .  CT obtained during hospitalization showed Asymmetric wall thickening and enhancement in the cecum , measuring 4.3 cm worrisome for underlying colonic neoplasm.  There are 3 hypodense liver lesions in both lobes of the liver.  Mildly prominent right ileocolic chain lymph nodes. MRI liver showed multiple bilateral liver lesions.    04/08/2024 colonoscopy showed Likely cecal primary with hepatic metastases. No other sites of metabolically active disease are seen. The prominent mesenteric nodes are visible on noncontrast CT but are not significantly avid above background.   Pathology showed invasive moderately differentiated adenocarcinoma.  pMMR Tempus liquid biopsy showed KRAS pG12V    04/15/2024 Cancer Staging   Staging form: Colon and Rectum, AJCC 8th Edition - Clinical stage from 04/15/2024: cT2, cN1, cM1 - Signed by Babara Call, MD on 05/31/2024 Stage prefix: Initial diagnosis Total positive nodes: 1   04/28/2024 Imaging   PET scan showed Liver: Lesions described in the liver in segments 2 and 4B (SUV max 14.1, CT 129 and 7.3, CT 140). The other lesion in the anterior right hepatic lobe is not metabolically active. The lesions described as likely hemangiomas do not show uptake above background.   Likely cecal primary with hepatic metastases. No other  sites of metabolically active disease are seen. The prominent mesenteric nodes are visible on noncontrast CT but are not significantly avid above background.      05/31/2024 -  Chemotherapy   Patient is on Treatment Plan : COLORECTAL FOLFOX q14d x 3 months      Patient denies family history of colon cancer.  Paternal grandmother had lung cancer. She denies any rectal bleeding, melena or change of bowel habits.  Patient is on methotrexate for rheumatoid arthritis. Denies unintentional weight loss, fever, night sweats.  Patient presents to discuss management plan.  Accompanied by her colleague.  She was seen by [facility] Dr. Jia. PET scan was done. She was recommended for liver lesion biopsy and she declined.  She reports having stopped all her medication. No longer on Methotrexate.   She is prescribed Valtrex  for herpes prophylaxis but has been taking it as needed rather than daily.  06/30/2024 CT chest angiogram showed Bilateral lower lobe segmental and subsegmental pulmonary emboli. Developing peripheral left lower lobe pulmonary infarctions Bilateral lower extremity ultrasound showed no DVT. Patient was started on anticoagulation and transition to Eliquis  at discharge. INTERVAL HISTORY Korin Setzler is a 44 y.o. 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   Discussed the use of AI scribe software for clinical note transcription with the  patient, who gave verbal consent to proceed.   S/p 3 cycles of FOLFOX  Patient reports feeling well.  She has had heavy menstrual period since start of the Eliquis . Denies shortness of breath chest pain today. Patient tolerates treatment so far.  She takes beta-blocker prescribed by primary care provider.  Tachycardia has improved.  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 RECK 2018 ANNUAL   Sickle cell trait    Vitamin D  deficiency 01/2016   ADD VIT D3 5000 IU DAILY    SURGICAL HISTORY: Past Surgical History:  Procedure Laterality Date   COLONOSCOPY N/A 04/08/2024   Procedure: COLONOSCOPY;  Surgeon: Jinny Carmine, MD;  Location: [facility] ENDOSCOPY;  Service: Endoscopy;  Laterality: N/A;   IR IMAGING GUIDED PORT INSERTION  05/26/2024   NO PAST SURGERIES      SOCIAL HISTORY: Social History   Socioeconomic History   Marital status: Single    Spouse name: Not on file   Number of children: 3   Years of education: 14   Highest education level: Not on file  Occupational History   Occupation: BUSINESS OFFICE SPECIALIST  Tobacco Use   Smoking status: Never   Smokeless tobacco: Never  Vaping Use   Vaping status: Never Used  Substance and Sexual Activity   Alcohol use: Not Currently   Drug use: No   Sexual activity: Yes    Birth control/protection: Pill  Other Topics Concern   Not on file  Social History Narrative   Not on file   Social Drivers of Health   Tobacco Use: Low Risk (07/27/2024)   Patient History    Smoking Tobacco Use: Never    Smokeless Tobacco Use: Never    Passive Exposure: Not on file  Financial Resource Strain: Low Risk (04/19/2024)   Received from [facility]   Overall Financial Resource Strain (CARDIA)    How hard is it for you to pay for the very basics like food, housing, medical care, and heating?: Not very hard  Recent Concern: Financial Resource Strain - Medium Risk (04/06/2024)   Received from [facility]   Overall Financial Resource Strain (CARDIA)    Difficulty of  Paying Living Expenses: Somewhat hard  Food Insecurity: No Food Insecurity (04/19/2024)   Received from [facility]   Epic    Within the past 12 months, you worried that your food would run out before you got the money to buy more.: Never true    Within the past 12 months, the food you bought just didn't last and you didn't have money to get more.: Never true  Recent Concern: Food Insecurity - Food Insecurity Present (04/12/2024)   Epic    Worried About Programme Researcher, Broadcasting/film/video in the Last Year: Sometimes true    Ran Out of Food in the Last Year: Never true  Transportation Needs: No Transportation Needs (04/19/2024)   Received from [facility] - Transportation    Lack of  Transportation (Medical): No    Lack of Transportation (Non-Medical): No  Physical Activity: Not on file  Stress: Not on file  Social Connections: Not on file  Intimate Partner Violence: Not At Risk (04/15/2024)   Epic    Fear of Current or Ex-Partner: No    Emotionally Abused: No    Physically Abused: No    Sexually Abused: No  Depression (PHQ2-9): Low Risk (07/27/2024)   Depression (PHQ2-9)    PHQ-2 Score: 0  Alcohol Screen: Not on file  Housing: Low Risk (04/15/2024)   Epic    Unable to Pay for Housing in the Last Year: No    Number of Times Moved in the Last Year: 0    Homeless in the Last Year: No  Utilities: Low Risk (04/19/2024)   [facility]   Utilities    Within the past 12 months, have you been unable to get utilities(heat, electricity) when it was really needed?: No  Recent Concern: Utilities - At Risk (04/06/2024)   Received from [facility]   Epic    In the past 12 months has the electric, gas, oil, or water  company threatened to shut off services in your home?: Yes  Health Literacy: Low Risk (04/19/2024)   Received from [facility] Literacy    How often do you need to have someone help you when you read instructions, pamphlets, or other  written material from your doctor or pharmacy?: Never    FAMILY HISTORY: Family History  Problem Relation Age of Onset   Diabetes Mellitus II Mother    Hypertension Mother    Hypercholesterolemia Father    Cancer Paternal Grandmother 17       LUNG   Sickle cell trait Son     ALLERGIES:  is allergic to tape.  MEDICATIONS:  Current Outpatient Medications  Medication Sig Dispense Refill   amLODipine  (NORVASC ) 5 MG tablet Take 1 tablet (5 mg total) by mouth daily. 30 tablet 11   [START ON 07/31/2024] apixaban  (ELIQUIS ) 5 MG TABS tablet Take 1 tablet (5 mg total) by mouth 2 (two) times daily. 60 tablet 4   Cholecalciferol (VITAMIN D3) 1.25 MG (50000 UT) CAPS TAKE 1 CAPSULE BY MOUTH ONE TIME PER WEEK 12 capsule 3   dexamethasone  (DECADRON ) 4 MG tablet Take 2 tablets (8 mg total) by mouth daily. Start the day after chemotherapy for 2 days. Take with food. 30 tablet 1   ferrous sulfate  220 (44 Fe) MG/5ML solution Take 220 mg by mouth daily.     lidocaine -prilocaine  (EMLA ) cream Apply to affected area once 30 g 3   LORazepam  (ATIVAN ) 0.5 MG tablet Take 1 tablet (0.5 mg total) by mouth every 12 (twelve) hours as needed for anxiety or sleep (nausea). 60 tablet 0   metoprolol  succinate (TOPROL  XL) 25 MG 24 hr tablet Take 1 tablet (25 mg total) by mouth daily. 30 tablet 11   ondansetron  (ZOFRAN ) 8 MG tablet Take 1 tablet (8 mg total) by mouth every 8 (eight) hours as needed for nausea or vomiting. Start on the third day after chemotherapy. 30 tablet 1   prochlorperazine  (COMPAZINE ) 10 MG tablet Take 1 tablet (10 mg total) by mouth every 6 (six) hours as needed for nausea or vomiting. 30 tablet 1   rosuvastatin  (CRESTOR ) 20 MG tablet TAKE 1 TABLET BY MOUTH EVERY DAY 90 tablet 3   valACYclovir  (VALTREX ) 500 MG tablet Take 1 tablet (500 mg total) by  mouth daily. 90 tablet 3   No current facility-administered medications for this visit.   Facility-Administered Medications Ordered in Other Visits   Medication Dose Route Frequency Provider Last Rate Last Admin   dextrose  5 % solution   Intravenous Continuous Babara Call, MD 10 mL/hr at 07/27/24 0946 New Bag at 07/27/24 0946   fluorouracil  (ADRUCIL ) 5,000 mg in sodium chloride  0.9 % 150 mL chemo infusion  2,400 mg/m2 (Treatment Plan Recorded) Intravenous 1 day or 1 dose Babara Call, MD       fluorouracil  (ADRUCIL ) chemo injection 900 mg  400 mg/m2 (Treatment Plan Recorded) Intravenous Once Savier Trickett, MD       leucovorin  896 mg in dextrose  5 % 250 mL infusion  400 mg/m2 (Treatment Plan Recorded) Intravenous Once Roshawn Ayala, MD       oxaliplatin  (ELOXATIN ) 200 mg in dextrose  5 % 500 mL chemo infusion  85 mg/m2 (Treatment Plan Recorded) Intravenous Once Babara Call, MD        Review of Systems  Constitutional:  Negative for appetite change, chills, fatigue and fever.  HENT:   Negative for hearing loss and voice change.   Eyes:  Negative for eye problems.  Respiratory:  Negative for chest tightness and cough.   Cardiovascular:  Negative for chest pain.  Gastrointestinal:  Negative for abdominal distention, abdominal pain and blood in stool.  Endocrine: Negative for hot flashes.  Genitourinary:  Negative for difficulty urinating and frequency.   Musculoskeletal:  Negative for arthralgias.  Skin:  Negative for itching and rash.  Neurological:  Negative for extremity weakness.  Hematological:  Negative for adenopathy.  Psychiatric/Behavioral:  Negative for confusion and sleep disturbance. The patient is nervous/anxious.      PHYSICAL EXAMINATION: ECOG PERFORMANCE STATUS: 0 - Asymptomatic  Vitals:   07/27/24 0857  BP: 130/76  Pulse: 88  Resp: 18  Temp: (!) 97.1 F (36.2 C)  SpO2: 100%   Filed Weights   07/27/24 0857  Weight: 216 lb 1.6 oz (98 kg)     Physical Exam Constitutional:      General: She is not in acute distress.    Appearance: She is not diaphoretic.  HENT:     Head: Normocephalic and atraumatic.  Eyes:     General: No  scleral icterus. Cardiovascular:     Rate and Rhythm: Regular rhythm. Tachycardia present.  Pulmonary:     Effort: Pulmonary effort is normal. No respiratory distress.  Abdominal:     General: Bowel sounds are normal. There is no distension.     Palpations: Abdomen is soft.  Musculoskeletal:        General: Normal range of motion.     Cervical back: Normal range of motion and neck supple.  Skin:    Findings: No erythema.  Neurological:     Mental Status: She is alert and oriented to person, place, and time. Mental status is at baseline.     Cranial Nerves: No cranial nerve deficit.     Motor: No abnormal muscle tone.  Psychiatric:        Mood and Affect: Mood and affect normal.      LABORATORY DATA:  I have reviewed the data as listed    Latest Ref Rng & Units 07/27/2024    8:32 AM 07/13/2024    8:59 AM 07/01/2024    7:10 AM  CBC  WBC 4.0 - 10.5 K/uL 10.2  5.7  13.3   Hemoglobin 12.0 - 15.0 g/dL 9.6  9.7  10.4   Hematocrit 36.0 - 46.0 % 29.4  30.1  31.9   Platelets 150 - 400 K/uL 157  258  159       Latest Ref Rng & Units 07/27/2024    8:32 AM 07/13/2024    8:59 AM 06/30/2024    6:35 PM  CMP  Glucose 70 - 99 mg/dL 96  98  88   BUN 6 - 20 mg/dL 5  10  6    Creatinine 0.44 - 1.00 mg/dL 9.14  9.11  8.97   Sodium 135 - 145 mmol/L 139  138  139   Potassium 3.5 - 5.1 mmol/L 4.0  4.1  3.8   Chloride 98 - 111 mmol/L 106  102  101   CO2 22 - 32 mmol/L 23  25  23    Calcium  8.9 - 10.3 mg/dL 8.9  9.4  8.9   Total Protein 6.5 - 8.1 g/dL 6.9  7.1  6.7   Total Bilirubin 0.0 - 1.2 mg/dL 0.3  0.4  0.7   Alkaline Phos 38 - 126 U/L 121  80  85   AST 15 - 41 U/L 21  31  31    ALT 0 - 44 U/L 15  30  25       RADIOGRAPHIC STUDIES: I have personally reviewed the radiological images as listed and agreed with the findings in the report. US  Venous Img Lower Bilateral (DVT) Result Date: 06/30/2024 EXAM: ULTRASOUND DUPLEX OF THE BILATERAL LOWER EXTREMITY VEINS TECHNIQUE: Duplex ultrasound  using B-mode/gray scaled imaging and Doppler spectral analysis and color flow was obtained of the deep venous structures of the bilateral lower extremity. COMPARISON: None available. CLINICAL HISTORY: 449891 Acute pulmonary embolism (HCC) 449891 Acute pulmonary embolism (HCC) FINDINGS: The common femoral vein, femoral vein, popliteal vein, and posterior tibial vein demonstrate normal compressibility with normal color flow and spectral analysis. IMPRESSION: 1. No evidence of DVT. Electronically signed by: Oneil Devonshire MD 06/30/2024 11:10 PM EST RP Workstation: [id number]   CT Angio Chest Pulmonary Embolism (PE) W or WO Contrast Result Date: 06/30/2024 EXAM: CTA CHEST 06/30/2024 03:59:59 PM TECHNIQUE: CTA of the chest was performed without and with the administration of 75 mL of iohexol  (OMNIPAQUE ) 350 MG/ML injection. Multiplanar reformatted images are provided for review. MIP images are provided for review. Automated exposure control, iterative reconstruction, and/or weight based adjustment of the mA/kV was utilized to reduce the radiation dose to as low as reasonably achievable. COMPARISON: None available. CLINICAL HISTORY: tachycardia FINDINGS: PULMONARY ARTERIES: Pulmonary arteries are adequately opacified for evaluation. Bilateral lower lobe segmental and subsegmental pulmonary emboli. Main pulmonary artery is normal in caliber. MEDIASTINUM: The heart and pericardium demonstrate no acute abnormality. No enlarged RV-LV ratio. There is no acute abnormality of the thoracic aorta. LYMPH NODES: No mediastinal, hilar or axillary lymphadenopathy. LUNGS AND PLEURA: Peripheral wedge-shaped airspace opacities as well as ground-glass opacities along the left lower lobe suggestive of developing peripheral left lower lobe pulmonary infarctions. No evidence of pleural effusion or pneumothorax. UPPER ABDOMEN: Limited images of the upper abdomen are unremarkable. SOFT TISSUES AND BONES: No acute bone or soft tissue  abnormality. IMPRESSION: 1. Bilateral lower lobe segmental and subsegmental pulmonary emboli. Developing peripheral left lower lobe pulmonary infarctions. No right heart strain. PRA to call report. Electronically signed by: Morgane Naveau MD 06/30/2024 04:11 PM EST RP Workstation: [id number]      "

## 2024-07-27 NOTE — Assessment & Plan Note (Signed)
 Continue Eliquis  5 mg twice daily

## 2024-07-29 ENCOUNTER — Other Ambulatory Visit: Payer: Self-pay

## 2024-07-29 ENCOUNTER — Inpatient Hospital Stay

## 2024-07-29 VITALS — BP 122/57 | HR 66 | Temp 96.0°F | Resp 16

## 2024-07-29 DIAGNOSIS — Z5111 Encounter for antineoplastic chemotherapy: Secondary | ICD-10-CM | POA: Diagnosis not present

## 2024-07-29 DIAGNOSIS — C180 Malignant neoplasm of cecum: Secondary | ICD-10-CM

## 2024-07-29 MED ORDER — PEGFILGRASTIM-JMDB 6 MG/0.6ML ~~LOC~~ SOSY
6.0000 mg | PREFILLED_SYRINGE | Freq: Once | SUBCUTANEOUS | Status: AC
  Administered 2024-07-29: 6 mg via SUBCUTANEOUS
  Filled 2024-07-29: qty 0.6

## 2024-07-31 ENCOUNTER — Encounter: Payer: Self-pay | Admitting: Oncology

## 2024-07-31 ENCOUNTER — Other Ambulatory Visit: Payer: Self-pay

## 2024-08-01 ENCOUNTER — Other Ambulatory Visit: Payer: Self-pay

## 2024-08-10 ENCOUNTER — Inpatient Hospital Stay

## 2024-08-10 ENCOUNTER — Inpatient Hospital Stay: Admitting: Oncology

## 2024-08-10 ENCOUNTER — Encounter: Payer: Self-pay | Admitting: Oncology

## 2024-08-10 VITALS — BP 130/95 | HR 97 | Temp 97.4°F | Resp 18 | Wt 213.1 lb

## 2024-08-10 VITALS — BP 139/73 | HR 72

## 2024-08-10 DIAGNOSIS — Z5111 Encounter for antineoplastic chemotherapy: Secondary | ICD-10-CM | POA: Diagnosis not present

## 2024-08-10 DIAGNOSIS — D701 Agranulocytosis secondary to cancer chemotherapy: Secondary | ICD-10-CM | POA: Diagnosis not present

## 2024-08-10 DIAGNOSIS — R7401 Elevation of levels of liver transaminase levels: Secondary | ICD-10-CM | POA: Diagnosis not present

## 2024-08-10 DIAGNOSIS — C180 Malignant neoplasm of cecum: Secondary | ICD-10-CM | POA: Diagnosis not present

## 2024-08-10 DIAGNOSIS — I2699 Other pulmonary embolism without acute cor pulmonale: Secondary | ICD-10-CM

## 2024-08-10 DIAGNOSIS — D696 Thrombocytopenia, unspecified: Secondary | ICD-10-CM

## 2024-08-10 DIAGNOSIS — K5900 Constipation, unspecified: Secondary | ICD-10-CM | POA: Diagnosis not present

## 2024-08-10 DIAGNOSIS — T451X5A Adverse effect of antineoplastic and immunosuppressive drugs, initial encounter: Secondary | ICD-10-CM | POA: Diagnosis not present

## 2024-08-10 DIAGNOSIS — D509 Iron deficiency anemia, unspecified: Secondary | ICD-10-CM | POA: Diagnosis not present

## 2024-08-10 LAB — CMP (CANCER CENTER ONLY)
ALT: 52 U/L — ABNORMAL HIGH (ref 0–44)
AST: 53 U/L — ABNORMAL HIGH (ref 15–41)
Albumin: 3.9 g/dL (ref 3.5–5.0)
Alkaline Phosphatase: 139 U/L — ABNORMAL HIGH (ref 38–126)
Anion gap: 13 (ref 5–15)
BUN: <5 mg/dL — ABNORMAL LOW (ref 6–20)
CO2: 23 mmol/L (ref 22–32)
Calcium: 9.3 mg/dL (ref 8.9–10.3)
Chloride: 105 mmol/L (ref 98–111)
Creatinine: 0.84 mg/dL (ref 0.44–1.00)
GFR, Estimated: >60 mL/min
Glucose, Bld: 100 mg/dL — ABNORMAL HIGH (ref 70–99)
Potassium: 3.7 mmol/L (ref 3.5–5.1)
Sodium: 141 mmol/L (ref 135–145)
Total Bilirubin: 0.4 mg/dL (ref 0.0–1.2)
Total Protein: 6.9 g/dL (ref 6.5–8.1)

## 2024-08-10 LAB — CBC WITH DIFFERENTIAL (CANCER CENTER ONLY)
Abs Immature Granulocytes: 0.12 10*3/uL — ABNORMAL HIGH (ref 0.00–0.07)
Basophils Absolute: 0 10*3/uL (ref 0.0–0.1)
Basophils Relative: 0 %
Eosinophils Absolute: 0 10*3/uL (ref 0.0–0.5)
Eosinophils Relative: 0 %
HCT: 30.9 % — ABNORMAL LOW (ref 36.0–46.0)
Hemoglobin: 10.1 g/dL — ABNORMAL LOW (ref 12.0–15.0)
Immature Granulocytes: 1 %
Lymphocytes Relative: 29 %
Lymphs Abs: 2.5 10*3/uL (ref 0.7–4.0)
MCH: 25.3 pg — ABNORMAL LOW (ref 26.0–34.0)
MCHC: 32.7 g/dL (ref 30.0–36.0)
MCV: 77.4 fL — ABNORMAL LOW (ref 80.0–100.0)
Monocytes Absolute: 0.6 10*3/uL (ref 0.1–1.0)
Monocytes Relative: 7 %
Neutro Abs: 5.3 10*3/uL (ref 1.7–7.7)
Neutrophils Relative %: 63 %
Platelet Count: 87 10*3/uL — ABNORMAL LOW (ref 150–400)
RBC: 3.99 MIL/uL (ref 3.87–5.11)
RDW: 21.6 % — ABNORMAL HIGH (ref 11.5–15.5)
WBC Count: 8.5 10*3/uL (ref 4.0–10.5)
nRBC: 0.2 % (ref 0.0–0.2)

## 2024-08-10 LAB — PREGNANCY, URINE: Preg Test, Ur: NEGATIVE

## 2024-08-10 MED ORDER — LEUCOVORIN CALCIUM INJECTION 350 MG
400.0000 mg/m2 | Freq: Once | INTRAVENOUS | Status: AC
  Administered 2024-08-10: 896 mg via INTRAVENOUS
  Filled 2024-08-10: qty 44.8

## 2024-08-10 MED ORDER — SODIUM CHLORIDE 0.9 % IV SOLN
2400.0000 mg/m2 | INTRAVENOUS | Status: DC
  Administered 2024-08-10: 5000 mg via INTRAVENOUS
  Filled 2024-08-10: qty 100

## 2024-08-10 MED ORDER — PALONOSETRON HCL INJECTION 0.25 MG/5ML
0.2500 mg | Freq: Once | INTRAVENOUS | Status: AC
  Administered 2024-08-10: 0.25 mg via INTRAVENOUS
  Filled 2024-08-10: qty 5

## 2024-08-10 MED ORDER — DEXTROSE 5 % IV SOLN
INTRAVENOUS | Status: DC
  Filled 2024-08-10: qty 250

## 2024-08-10 MED ORDER — FLUOROURACIL CHEMO INJECTION 2.5 GM/50ML
400.0000 mg/m2 | Freq: Once | INTRAVENOUS | Status: AC
  Administered 2024-08-10: 900 mg via INTRAVENOUS
  Filled 2024-08-10: qty 18

## 2024-08-10 MED ORDER — DEXAMETHASONE SOD PHOSPHATE PF 10 MG/ML IJ SOLN
10.0000 mg | Freq: Once | INTRAMUSCULAR | Status: AC
  Administered 2024-08-10: 10 mg via INTRAVENOUS
  Filled 2024-08-10: qty 1

## 2024-08-10 MED ORDER — OXALIPLATIN CHEMO INJECTION 100 MG/20ML
85.0000 mg/m2 | Freq: Once | INTRAVENOUS | Status: AC
  Administered 2024-08-10: 200 mg via INTRAVENOUS
  Filled 2024-08-10: qty 40

## 2024-08-10 MED ORDER — DOCUSATE SODIUM 100 MG PO CAPS: 100.0000 mg | ORAL_CAPSULE | Freq: Two times a day (BID) | ORAL | Status: AC

## 2024-08-10 MED ORDER — LORAZEPAM 2 MG/ML IJ SOLN
0.5000 mg | Freq: Once | INTRAMUSCULAR | Status: AC | PRN
  Administered 2024-08-10: 0.5 mg via INTRAVENOUS
  Filled 2024-08-10: qty 1

## 2024-08-10 NOTE — Assessment & Plan Note (Signed)
 She gets long acting G-CSF on D3. She will take Claritin 10 mg daily for 4 days.

## 2024-08-10 NOTE — Assessment & Plan Note (Signed)
 Grade 1. Due to chemotherapy.  Monitor.

## 2024-08-10 NOTE — Assessment & Plan Note (Signed)
 Lab Results  Component Value Date   HGB 10.1 (L) 08/10/2024   TIBC 377 04/08/2024   IRONPCTSAT 5 (L) 04/08/2024   FERRITIN 63 04/08/2024    She takes oral iron supplementation. IDA is due to chronic blood loss, GI and heavy menstrual period.  Option of proceed with IV Venofer was discussed. . Patient prefers to continue iron supplementation for now.

## 2024-08-10 NOTE — Assessment & Plan Note (Addendum)
 Primary cecal adenocarcinoma, ileocolic mesenteric adenopathy and liver lesions. Baseline CEA was elevated at 32.8. pMMR  2nd opinion recommendation at [facility] was reviewed.  Recommend liver lesion biopsy patient declined.  Recommend molecular profiling- NGS testing. She has no desire of fertility preservation.  Recommend genetic testing-patient reports that she has a genetic appointment at [facility] in Feb 2026  Labs are reviewed and discussed with patient. Proceed with cycle 5 FOLFOX with day 3 pump DC

## 2024-08-10 NOTE — Assessment & Plan Note (Signed)
 Chemotherapy induced thrombocytopenia.  Repeat cbc in 1 week.

## 2024-08-10 NOTE — Assessment & Plan Note (Signed)
 Recommend colace 100mg  BID

## 2024-08-10 NOTE — Assessment & Plan Note (Signed)
 Continue Eliquis  5 mg twice daily

## 2024-08-10 NOTE — Progress Notes (Signed)
 " Hematology/Oncology Progress note [phone] [phone]     CHIEF COMPLAINTS/PURPOSE OF CONSULTATION:  Cecal adenocarcinoma liver lesions   ASSESSMENT & PLAN:   Cancer Staging  Primary cancer of cecum [facility]) Staging form: Colon and Rectum, AJCC 8th Edition - Clinical stage from 04/15/2024: cT2, cN1, cM1 - Signed by Babara Call, MD on 05/31/2024   Primary cancer of cecum [facility]) Primary cecal adenocarcinoma, ileocolic mesenteric adenopathy and liver lesions. Baseline CEA was elevated at 32.8. pMMR  2nd opinion recommendation at [facility] was reviewed.  Recommend liver lesion biopsy patient declined.  Recommend molecular profiling- NGS testing. She has no desire of fertility preservation.  Recommend genetic testing-patient reports that she has a genetic appointment at [facility] in Feb 2026  Labs are reviewed and discussed with patient. 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   Thrombocytopenia Chemotherapy induced thrombocytopenia.  Repeat cbc in 1 week.   Iron deficiency anemia Lab Results  Component Value Date   HGB 10.1 (L) 08/10/2024   TIBC 377 04/08/2024   IRONPCTSAT 5 (L) 04/08/2024   FERRITIN 63 04/08/2024    She takes oral iron supplementation. IDA is due to chronic blood loss, GI and heavy menstrual period.  Option of proceed with IV Venofer was discussed. . Patient prefers to continue iron supplementation for now.    Constipation Recommend colace 100mg  BID  Chemotherapy induced neutropenia She gets long acting G-CSF on D3. She will take Claritin 10 mg daily for 4 days.   Transaminitis Grade 1. Due to chemotherapy.  Monitor.    Orders Placed This Encounter  Procedures   CBC with Differential (Cancer Center Only)    Standing Status:   Future    Expected Date:   08/17/2024    Expiration Date:   11/15/2024   Follow-up 2 weeks   All questions were answered. The patient knows to call the clinic with any problems, questions or concerns.  Call Babara, MD, PhD [facility] Hematology Oncology 08/10/2024    HISTORY OF PRESENTING ILLNESS:  Amber Price 44 y.o. female presents to establish care for stage IV cecal adenocarcinoma I have reviewed her chart and materials related to her cancer extensively and collaborated history with the patient. Summary of oncologic history is as follows: Oncology History  Primary cancer of cecum (HCC)  04/06/2024 Imaging   CT abdomen pelvis with contrast showed 1. Asymmetric wall thickening and enhancement in the cecum, measuring 4.3 cm in craniocaudal dimension, and involving the appendiceal orifice (axial 50, sagittal 61), worrisome for underlying colonic neoplasm. Likely causing obstruction of the appendix, which is fluid-filled and dilated appendix, suggesting acute appendicitis. Surgical consultation recommended. 2. A couple of mildly prominent right ileocolic chain lymph nodes are present measuring up to 7 mm (axial 50-52). These could be reactive or metastatic. 3. There are 3 hypodense lesions in both lobes of the liver, measuring up to 1.8 cm, likely metastatic disease.     04/06/2024 Imaging   CT chest angiogram No evidence of pulm embolism or other acute intrathoracic process.   04/07/2024 Imaging   MRI of liver with and without contrast showed 1. Multiple bilateral liver lesions. 2 left hepatic lobe lesions are highly suspicious for metastasis. 1 right liver lobe lesion is indeterminate but favored to represent a hemangioma. 2 smaller right liver lobe lesions are favored to be benign, likely hemangiomas. Consider sampling of the segment  2 lesion versus further evaluation with PET. 2.  Cecal mass with adjacent adenopathy, suspicious for nodal metastasis.  04/08/2024, colonoscopy showed fungating partially obstructing large mass in the cecum. Biopsy pathology showed  invasive moderately differentiated adenocarcinoma. pMMR    04/15/2024 Initial Diagnosis   Primary cancer of cecum   Patient presented to emergency room for evaluation of UTI symptoms.  She was found to have sepsis secondary to E. coli bacteremia, acute UTI.  Urine culture grew pansensitive E. coli.  Patient was treated with IV antibiotics and discharged on oral Levaquin .  CT obtained during hospitalization showed Asymmetric wall thickening and enhancement in the cecum , measuring 4.3 cm worrisome for underlying colonic neoplasm.  There are 3 hypodense liver lesions in both lobes of the liver.  Mildly prominent right ileocolic chain lymph nodes. MRI liver showed multiple bilateral liver lesions.    04/08/2024 colonoscopy showed Likely cecal primary with hepatic metastases. No other sites of metabolically active disease are seen. The prominent mesenteric nodes are visible on noncontrast CT but are not significantly avid above background.   Pathology showed invasive moderately differentiated adenocarcinoma.  pMMR Tempus liquid biopsy showed KRAS pG12V    04/15/2024 Cancer Staging   Staging form: Colon and Rectum, AJCC 8th Edition - Clinical stage from 04/15/2024: cT2, cN1, cM1 - Signed by Babara Call, MD on 05/31/2024 Stage prefix: Initial diagnosis Total positive nodes: 1   04/28/2024 Imaging   PET scan showed Liver: Lesions described in the liver in segments 2 and 4B (SUV max 14.1, CT 129 and 7.3, CT 140). The other lesion in the anterior right hepatic lobe is not metabolically active. The lesions described as likely hemangiomas do not show uptake above background.   Likely cecal primary with hepatic metastases. No other sites of metabolically active disease are seen. The prominent mesenteric nodes are visible on noncontrast CT but are not significantly avid above background.      05/31/2024 -  Chemotherapy   Patient is on Treatment Plan : COLORECTAL FOLFOX q14d x 3 months      Patient  denies family history of colon cancer.  Paternal grandmother had lung cancer. She denies any rectal bleeding, melena or change of bowel habits.  Patient is on methotrexate for rheumatoid arthritis. Denies unintentional weight loss, fever, night sweats.  Patient presents to discuss management plan.  Accompanied by her colleague.  She was seen by [facility] Dr. Jia. PET scan was done. She was recommended for liver lesion biopsy and she declined.  She reports having stopped all her medication. No longer on Methotrexate.   She is prescribed Valtrex  for herpes prophylaxis but has been taking it as needed rather than daily.  06/30/2024 CT chest angiogram showed Bilateral lower lobe segmental and subsegmental pulmonary emboli. Developing peripheral left lower lobe pulmonary infarctions Bilateral lower extremity ultrasound showed no DVT. Patient was started on anticoagulation and transition to Eliquis  at discharge. INTERVAL HISTORY Amber Price is a 44 y.o. 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   Discussed the use of AI scribe software for clinical note transcription with the patient, who gave verbal consent to proceed.   S/p 3 cycles of FOLFOX  Patient reports feeling well.  She has had heavy menstrual period since start of the Eliquis . Denies shortness of breath chest pain today. Patient tolerates treatment so far.   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 RECK 2018 ANNUAL   Sickle cell trait    Vitamin D  deficiency 01/2016   ADD VIT D3 5000 IU DAILY     SURGICAL HISTORY: Past Surgical History:  Procedure Laterality Date   COLONOSCOPY N/A 04/08/2024   Procedure: COLONOSCOPY;  Surgeon: Jinny Carmine, MD;  Location[facility] ENDOSCOPY;  Service: Endoscopy;  Laterality: N/A;   IR IMAGING GUIDED PORT INSERTION  05/26/2024   NO PAST SURGERIES      SOCIAL HISTORY: Social History   Socioeconomic History   Marital status: Single    Spouse name: Not on file   Number of children: 3   Years of education: 14   Highest education level: Not on file  Occupational History   Occupation: BUSINESS OFFICE SPECIALIST  Tobacco Use   Smoking status: Never   Smokeless tobacco: Never  Vaping Use   Vaping status: Never Used  Substance and Sexual Activity   Alcohol use: Not Currently   Drug use: No   Sexual activity: Yes    Birth control/protection: Pill  Other Topics Concern   Not on file  Social History Narrative   Not on file   Social Drivers of Health   Tobacco Use: Low Risk (08/10/2024)   Patient History    Smoking Tobacco Use: Never    Smokeless Tobacco Use: Never    Passive Exposure: Not on file  Financial Resource Strain: Low Risk (04/19/2024)   Received from [facility]   Overall Financial Resource Strain (CARDIA)    How hard is it for you to pay for the very basics like food, housing, medical care, and heating?: Not very hard  Recent Concern: Financial Resource Strain - Medium Risk (04/06/2024)   Received from [facility]   Overall Financial Resource Strain (CARDIA)    Difficulty of Paying Living Expenses: Somewhat hard  Food Insecurity: No Food Insecurity (04/19/2024)   Received from [facility]   Epic    Within the past 12 months, you worried that your food would run out before you got the money to buy more.: Never true    Within the past 12 months, the food you bought just didn't last and you didn't have money to get more.: Never true  Recent Concern: Food Insecurity - Food Insecurity Present (04/12/2024)    Epic    Worried About Programme Researcher, Broadcasting/film/video in the Last Year: Sometimes true    Ran Out of Food in the Last Year: Never true  Transportation Needs: No Transportation Needs (04/19/2024)   Received from [facility] - Transportation    Lack of Transportation (Medical): No    Lack of Transportation (Non-Medical): No  Physical Activity: Not on file  Stress: Not on file  Social Connections: Not on file  Intimate Partner Violence: Not At Risk (04/15/2024)   Epic    Fear of Current or Ex-Partner: No    Emotionally Abused: No    Physically Abused: No    Sexually Abused: No  Depression (PHQ2-9): Low Risk (07/27/2024)   Depression (PHQ2-9)  PHQ-2 Score: 0  Alcohol Screen: Not on file  Housing: Low Risk (04/15/2024)   Epic    Unable to Pay for Housing in the Last Year: No    Number of Times Moved in the Last Year: 0    Homeless in the Last Year: No  Utilities: Low Risk (04/19/2024)   Received from [facility]   Utilities    Within the past 12 months, have you been unable to get utilities(heat, electricity) when it was really needed?: No  Recent Concern: Utilities - At Risk (04/06/2024)   Received from [facility]   Epic    In the past 12 months has the electric, gas, oil, or water  company threatened to shut off services in your home?: Yes  Health Literacy: Low Risk (04/19/2024)   Received from [facility] Literacy    How often do you need to have someone help you when you read instructions, pamphlets, or other written material from your doctor or pharmacy?: Never    FAMILY HISTORY: Family History  Problem Relation Age of Onset   Diabetes Mellitus II Mother    Hypertension Mother    Hypercholesterolemia Father    Cancer Paternal Grandmother 64       LUNG   Sickle cell trait Son     ALLERGIES:  is allergic to tape.  MEDICATIONS:  Current Outpatient Medications  Medication Sig Dispense Refill   amLODipine  (NORVASC ) 5 MG tablet Take  1 tablet (5 mg total) by mouth daily. 30 tablet 11   apixaban  (ELIQUIS ) 5 MG TABS tablet Take 1 tablet (5 mg total) by mouth 2 (two) times daily. 60 tablet 4   Cholecalciferol (VITAMIN D3) 1.25 MG (50000 UT) CAPS TAKE 1 CAPSULE BY MOUTH ONE TIME PER WEEK 12 capsule 3   dexamethasone  (DECADRON ) 4 MG tablet Take 2 tablets (8 mg total) by mouth daily. Start the day after chemotherapy for 2 days. Take with food. 30 tablet 1   docusate sodium  (COLACE) 100 MG capsule Take 1 capsule (100 mg total) by mouth 2 (two) times daily.     ferrous sulfate  220 (44 Fe) MG/5ML solution Take 220 mg by mouth daily.     lidocaine -prilocaine  (EMLA ) cream Apply to affected area once 30 g 3   LORazepam  (ATIVAN ) 0.5 MG tablet Take 1 tablet (0.5 mg total) by mouth every 12 (twelve) hours as needed for anxiety or sleep (nausea). 60 tablet 0   metoprolol  succinate (TOPROL  XL) 25 MG 24 hr tablet Take 1 tablet (25 mg total) by mouth daily. 30 tablet 11   ondansetron  (ZOFRAN ) 8 MG tablet Take 1 tablet (8 mg total) by mouth every 8 (eight) hours as needed for nausea or vomiting. Start on the third day after chemotherapy. 30 tablet 1   prochlorperazine  (COMPAZINE ) 10 MG tablet Take 1 tablet (10 mg total) by mouth every 6 (six) hours as needed for nausea or vomiting. 30 tablet 1   rosuvastatin  (CRESTOR ) 20 MG tablet TAKE 1 TABLET BY MOUTH EVERY DAY 90 tablet 3   valACYclovir  (VALTREX ) 500 MG tablet Take 1 tablet (500 mg total) by mouth daily. 90 tablet 3   No current facility-administered medications for this visit.   Facility-Administered Medications Ordered in Other Visits  Medication Dose Route Frequency Provider Last Rate Last Admin   dexamethasone  (DECADRON ) injection 10 mg  10 mg Intravenous Once Babara Call, MD       dextrose  5 % solution   Intravenous  Continuous Babara Call, MD 10 mL/hr at 08/10/24 0912 New Bag at 08/10/24 0912   fluorouracil  (ADRUCIL ) 5,000 mg in sodium chloride  0.9 % 150 mL chemo infusion  2,400 mg/m2  (Treatment Plan Recorded) Intravenous 1 day or 1 dose Babara Call, MD       fluorouracil  (ADRUCIL ) chemo injection 900 mg  400 mg/m2 (Treatment Plan Recorded) Intravenous Once Ordell Prichett, MD       leucovorin  896 mg in dextrose  5 % 250 mL infusion  400 mg/m2 (Treatment Plan Recorded) Intravenous Once Babara Call, MD       LORazepam  (ATIVAN ) injection 0.5 mg  0.5 mg Intravenous Once PRN Jeanita Carneiro, MD       oxaliplatin  (ELOXATIN ) 200 mg in dextrose  5 % 500 mL chemo infusion  85 mg/m2 (Treatment Plan Recorded) Intravenous Once Babara Call, MD       palonosetron  (ALOXI ) injection 0.25 mg  0.25 mg Intravenous Once Babara Call, MD        Review of Systems  Constitutional:  Negative for appetite change, chills, fatigue and fever.  HENT:   Negative for hearing loss and voice change.   Eyes:  Negative for eye problems.  Respiratory:  Negative for chest tightness and cough.   Cardiovascular:  Negative for chest pain.  Gastrointestinal:  Negative for abdominal distention, abdominal pain and blood in stool.  Endocrine: Negative for hot flashes.  Genitourinary:  Negative for difficulty urinating and frequency.   Musculoskeletal:  Negative for arthralgias.  Skin:  Negative for itching and rash.  Neurological:  Negative for extremity weakness.  Hematological:  Negative for adenopathy.  Psychiatric/Behavioral:  Negative for confusion and sleep disturbance. The patient is nervous/anxious.      PHYSICAL EXAMINATION: ECOG PERFORMANCE STATUS: 0 - Asymptomatic  Vitals:   08/10/24 0838  BP: (!) 130/95  Pulse: 97  Resp: 18  Temp: (!) 97.4 F (36.3 C)   Filed Weights   08/10/24 0838  Weight: 213 lb 1.6 oz (96.7 kg)     Physical Exam Constitutional:      General: She is not in acute distress.    Appearance: She is not diaphoretic.  HENT:     Head: Normocephalic and atraumatic.  Eyes:     General: No scleral icterus. Cardiovascular:     Rate and Rhythm: Regular rhythm. Tachycardia present.  Pulmonary:      Effort: Pulmonary effort is normal. No respiratory distress.  Abdominal:     General: Bowel sounds are normal. There is no distension.     Palpations: Abdomen is soft.  Musculoskeletal:        General: Normal range of motion.     Cervical back: Normal range of motion and neck supple.  Skin:    Findings: No erythema.  Neurological:     Mental Status: She is alert and oriented to person, place, and time. Mental status is at baseline.     Cranial Nerves: No cranial nerve deficit.     Motor: No abnormal muscle tone.  Psychiatric:        Mood and Affect: Mood and affect normal.      LABORATORY DATA:  I have reviewed the data as listed    Latest Ref Rng & Units 08/10/2024    8:21 AM 07/27/2024    8:32 AM 07/13/2024    8:59 AM  CBC  WBC 4.0 - 10.5 K/uL 8.5  10.2  5.7   Hemoglobin 12.0 - 15.0 g/dL 89.8  9.6  9.7   Hematocrit  36.0 - 46.0 % 30.9  29.4  30.1   Platelets 150 - 400 K/uL 87  157  258       Latest Ref Rng & Units 08/10/2024    8:21 AM 07/27/2024    8:32 AM 07/13/2024    8:59 AM  CMP  Glucose 70 - 99 mg/dL 899  96  98   BUN 6 - 20 mg/dL 5  5  10    Creatinine 0.44 - 1.00 mg/dL 9.15  9.14  9.11   Sodium 135 - 145 mmol/L 141  139  138   Potassium 3.5 - 5.1 mmol/L 3.7  4.0  4.1   Chloride 98 - 111 mmol/L 105  106  102   CO2 22 - 32 mmol/L 23  23  25    Calcium  8.9 - 10.3 mg/dL 9.3  8.9  9.4   Total Protein 6.5 - 8.1 g/dL 6.9  6.9  7.1   Total Bilirubin 0.0 - 1.2 mg/dL 0.4  0.3  0.4   Alkaline Phos 38 - 126 U/L 139  121  80   AST 15 - 41 U/L 53  21  31   ALT 0 - 44 U/L 52  15  30      RADIOGRAPHIC STUDIES: I have personally reviewed the radiological images as listed and agreed with the findings in the report. No results found.     "

## 2024-08-10 NOTE — Progress Notes (Signed)
 Pt here for follow up. Pt reports that she is constipated and has noticed some blood in paper when she wipes, most likely related to straining.

## 2024-08-11 ENCOUNTER — Other Ambulatory Visit: Payer: Self-pay

## 2024-08-11 ENCOUNTER — Encounter: Payer: Self-pay | Admitting: Oncology

## 2024-08-11 MED ORDER — GABAPENTIN 100 MG PO CAPS
100.0000 mg | ORAL_CAPSULE | Freq: Three times a day (TID) | ORAL | 1 refills | Status: AC
  Filled 2024-08-11: qty 90, 30d supply, fill #0

## 2024-08-12 ENCOUNTER — Encounter: Payer: Self-pay | Admitting: Oncology

## 2024-08-12 ENCOUNTER — Inpatient Hospital Stay

## 2024-08-12 ENCOUNTER — Other Ambulatory Visit: Payer: Self-pay

## 2024-08-12 VITALS — BP 128/83 | HR 80 | Temp 98.0°F | Resp 20

## 2024-08-12 DIAGNOSIS — C180 Malignant neoplasm of cecum: Secondary | ICD-10-CM

## 2024-08-12 DIAGNOSIS — Z5111 Encounter for antineoplastic chemotherapy: Secondary | ICD-10-CM | POA: Diagnosis not present

## 2024-08-12 MED ORDER — STERILE WATER FOR INJECTION IJ SOLN
5.0000 mL | Freq: Four times a day (QID) | OROMUCOSAL | 1 refills | Status: AC | PRN
  Filled 2024-08-12: qty 480, 12d supply, fill #0

## 2024-08-12 MED ORDER — PEGFILGRASTIM-JMDB 6 MG/0.6ML ~~LOC~~ SOSY
6.0000 mg | PREFILLED_SYRINGE | Freq: Once | SUBCUTANEOUS | Status: AC
  Administered 2024-08-12: 6 mg via SUBCUTANEOUS
  Filled 2024-08-12: qty 0.6

## 2024-08-24 ENCOUNTER — Inpatient Hospital Stay

## 2024-08-24 ENCOUNTER — Inpatient Hospital Stay: Admitting: Oncology

## 2024-08-26 ENCOUNTER — Other Ambulatory Visit

## 2024-08-26 ENCOUNTER — Inpatient Hospital Stay

## 2024-08-29 ENCOUNTER — Encounter

## 2024-09-06 ENCOUNTER — Ambulatory Visit: Admitting: Cardiovascular Disease

## 2024-10-25 ENCOUNTER — Ambulatory Visit: Admitting: Internal Medicine
